# Patient Record
Sex: Male | Born: 1935 | Race: White | Hispanic: No | Marital: Married | Smoking: Former smoker
Health system: Southern US, Community
[De-identification: ages and names within clinical notes are randomized; demographics above are authoritative.]

## PROBLEM LIST (undated history)

## (undated) DIAGNOSIS — E119 Type 2 diabetes mellitus without complications: Secondary | ICD-10-CM

## (undated) DIAGNOSIS — J449 Chronic obstructive pulmonary disease, unspecified: Secondary | ICD-10-CM

## (undated) DIAGNOSIS — I2510 Atherosclerotic heart disease of native coronary artery without angina pectoris: Secondary | ICD-10-CM

## (undated) DIAGNOSIS — C61 Malignant neoplasm of prostate: Secondary | ICD-10-CM

## (undated) DIAGNOSIS — E785 Hyperlipidemia, unspecified: Secondary | ICD-10-CM

## (undated) DIAGNOSIS — I4892 Unspecified atrial flutter: Secondary | ICD-10-CM

## (undated) DIAGNOSIS — N182 Chronic kidney disease, stage 2 (mild): Secondary | ICD-10-CM

## (undated) DIAGNOSIS — I639 Cerebral infarction, unspecified: Secondary | ICD-10-CM

## (undated) DIAGNOSIS — I10 Essential (primary) hypertension: Secondary | ICD-10-CM

## (undated) HISTORY — DX: Malignant neoplasm of prostate: C61

## (undated) HISTORY — PX: CARDIAC SURGERY: SHX584

## (undated) HISTORY — DX: Type 2 diabetes mellitus without complications: E11.9

## (undated) HISTORY — DX: Chronic obstructive pulmonary disease, unspecified: J44.9

## (undated) HISTORY — DX: Unspecified atrial flutter: I48.92

## (undated) HISTORY — DX: Hyperlipidemia, unspecified: E78.5

## (undated) HISTORY — PX: CORONARY ARTERY BYPASS GRAFT: SHX141

## (undated) HISTORY — DX: Atherosclerotic heart disease of native coronary artery without angina pectoris: I25.10

## (undated) HISTORY — DX: Essential (primary) hypertension: I10

## (undated) HISTORY — PX: CARDIAC CATHETERIZATION: SHX172

## (undated) HISTORY — DX: Cerebral infarction, unspecified: I63.9

---

## 2004-12-05 ENCOUNTER — Ambulatory Visit: Payer: Self-pay | Admitting: Radiation Oncology

## 2004-12-19 ENCOUNTER — Ambulatory Visit: Payer: Self-pay | Admitting: Radiation Oncology

## 2005-12-04 ENCOUNTER — Ambulatory Visit: Payer: Self-pay | Admitting: Radiation Oncology

## 2005-12-19 ENCOUNTER — Ambulatory Visit: Payer: Self-pay | Admitting: Radiation Oncology

## 2006-12-04 ENCOUNTER — Ambulatory Visit: Payer: Self-pay | Admitting: Radiation Oncology

## 2006-12-19 ENCOUNTER — Ambulatory Visit: Payer: Self-pay | Admitting: Radiation Oncology

## 2007-12-03 ENCOUNTER — Ambulatory Visit: Payer: Self-pay | Admitting: Radiation Oncology

## 2007-12-08 ENCOUNTER — Ambulatory Visit: Payer: Self-pay | Admitting: Gastroenterology

## 2007-12-20 ENCOUNTER — Ambulatory Visit: Payer: Self-pay | Admitting: Radiation Oncology

## 2008-11-18 ENCOUNTER — Ambulatory Visit: Payer: Medicare Other | Admitting: Radiation Oncology

## 2008-12-01 ENCOUNTER — Ambulatory Visit: Payer: Medicare Other | Admitting: Radiation Oncology

## 2008-12-06 ENCOUNTER — Ambulatory Visit: Payer: Self-pay | Admitting: Cardiology

## 2008-12-12 ENCOUNTER — Ambulatory Visit: Payer: Self-pay

## 2008-12-19 ENCOUNTER — Ambulatory Visit: Payer: Medicare Other | Admitting: Radiation Oncology

## 2008-12-27 ENCOUNTER — Ambulatory Visit: Payer: Self-pay | Admitting: Cardiology

## 2009-09-14 DIAGNOSIS — I25810 Atherosclerosis of coronary artery bypass graft(s) without angina pectoris: Secondary | ICD-10-CM | POA: Insufficient documentation

## 2009-11-18 ENCOUNTER — Ambulatory Visit: Payer: Medicare Other | Admitting: Radiation Oncology

## 2009-12-04 ENCOUNTER — Ambulatory Visit: Payer: Medicare Other | Admitting: Radiation Oncology

## 2009-12-19 ENCOUNTER — Ambulatory Visit: Payer: Medicare Other | Admitting: Radiation Oncology

## 2010-02-20 ENCOUNTER — Encounter: Payer: Self-pay | Admitting: Cardiology

## 2010-02-20 ENCOUNTER — Ambulatory Visit: Payer: Self-pay | Admitting: Internal Medicine

## 2010-02-20 DIAGNOSIS — I252 Old myocardial infarction: Secondary | ICD-10-CM | POA: Insufficient documentation

## 2010-12-03 ENCOUNTER — Ambulatory Visit: Payer: Medicare Other | Admitting: Radiation Oncology

## 2010-12-04 LAB — PSA

## 2010-12-18 NOTE — Assessment & Plan Note (Signed)
Summary: f1y   Visit Type:  1 yr f/u  CC:  no cardiac complaints today.  Clinical Reports Reviewed:  Nuclear Study:  12/12/2008:  Exercise capacity - Adenosine study with no exercise  Blood Pressure - Normal BP response  Clinical Symptoms - no CP or dyspnea  ECG impression - No significant ST nsegment change suggestive of ischemia  Overall impression -  Scar in the inferior, inferolateral Zohan Shiflet with minimal perinfarct ischemia. Cannot rule out coexistent soft tissue attenuation. Again, no significant ischemia.   Current Medications (verified): 1)  Aspirin 81 Mg Tbec (Aspirin) .... Take One Tablet By Mouth Daily 2)  Centrum Silver  Tabs (Multiple Vitamins-Minerals) .Marland Kitchen.. 1 Tab Once Daily 3)  Meloxicam 15 Mg Tabs (Meloxicam) .Marland Kitchen.. 1 Tab Once Daily 4)  Lisinopril 5 Mg Tabs (Lisinopril) .Marland Kitchen.. 1 Tab Once Daily 5)  Coreg Cr 40 Mg Xr24h-Cap (Carvedilol Phosphate) .Marland Kitchen.. 1 Tab Once Daily 6)  Glyburide-Metformin 2.5-500 Mg Tabs (Glyburide-Metformin) .Marland Kitchen.. 1 Tab Two Times A Day 7)  Caduet 5-20 Mg Tabs (Amlodipine-Atorvastatin) .Marland Kitchen.. 1 Tab Once Daily 8)  Advair Diskus 250-50 Mcg/dose Aepb (Fluticasone-Salmeterol) .Marland Kitchen.. 1 Puff Once Daily 9)  Ventolin Hfa 108 (90 Base) Mcg/act Aers (Albuterol Sulfate) .Marland Kitchen.. 1 Puff Once Daily 10)  Vitamin D3 2000 Unit Caps (Cholecalciferol) .Marland Kitchen.. 1 Cap Once Daily  Allergies (verified): No Known Drug Allergies  Vital Signs:  Patient profile:   77 year old male Height:      70 inches Weight:      233 pounds BMI:     33.55 Pulse rate:   59 / minute Pulse rhythm:   irregular BP sitting:   104 / 60  (left arm) Cuff size:   large  Vitals Entered By: Julaine Hua, CMA (February 20, 2010 10:58 AM)   Impression & Recommendations:  Problem # 1:  CAD, ARTERY BYPASS GRAFT (ICD-414.04) Assessment Unchanged  He is stable from my standpoint. His Plavix has been discontinued which I agree with. He'll continue with aspirin. I'll see him back again in a year. His updated  medication list for this problem includes:    Aspirin 81 Mg Tbec (Aspirin) .Marland Kitchen... Take one tablet by mouth daily    Lisinopril 5 Mg Tabs (Lisinopril) .Marland Kitchen... 1 tab once daily    Coreg Cr 40 Mg Xr24h-cap (Carvedilol phosphate) .Marland Kitchen... 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 Take one tablet by mouth daily    Lisinopril 5 Mg Tabs (Lisinopril) .Marland Kitchen... 1 tab once daily    Coreg Cr 40 Mg Xr24h-cap (Carvedilol phosphate) .Marland Kitchen... 1 tab once daily  Patient Instructions: 1)  Your physician recommends that you schedule a follow-up appointment in: [place] 2)  Your physician recommends that you continue on your current medications as directed. Please refer to the Current Medication list given to you today.  Appended Document: [facility]     Primary Provider:  Dr. Bernita Buffy.Marland Kitchen   History of Present Illness: Mr Havranek returns for E and M of his CAD. He has no complaints of ischemic coronary disease, He denies DOE, palpitations, syncope, or peripheral edema.  He is now off Plavix.  Allergies: No Known Drug Allergies

## 2010-12-19 ENCOUNTER — Ambulatory Visit: Payer: Medicare Other | Admitting: Radiation Oncology

## 2011-04-02 NOTE — Assessment & Plan Note (Signed)
[facility] OFFICE NOTE   NAME:Orrico, MELVIN                       MRN:          [id number]  DATE:12/27/2008                            DOB:          07-15-1927    Mr. Zapanta comes in today for followup.   Please see my extensive note on December 06, 2008.   I received his operative report from [facility].  His surgical date  was May 05, 1990.  At that time, he had a vein graft placed to the left  PDA.  He had a vein graft placed to the ramus intermediate, and LIMA to  the proximal LAD.  He was part of the BARI study.   Stress Myoview showed scar of the inferior inferolateral wall with  minimal peri-infarct ischemia.  It may have been some soft tissue  attenuation.  There was felt to be no significant ischemia.  His EF  could not be gated.   His exam today, his blood pressure is 120/58, his pulse 68 and regular,  his weight is 216.  Rest of the exam is unchanged.   I have reviewed his meds once again.  I have gone over his operative  report from 1991 as well as a stress test and answered all questions  that he and his wife had.  I have talked about the importance of how to  handle an acute coronary syndrome including 911.  I will plan on seeing  him back in a year.     Thomas C. Verl Blalock, MD, [facility]  Electronically Signed    TCW/MedQ  DD: 12/27/2008  DT: 12/27/2008  Job #: [id number]   cc:   Lorelee Market

## 2011-04-02 NOTE — Assessment & Plan Note (Signed)
[facility] OFFICE NOTE   NAME:Devin Reese, Devin Reese                       MRN:          [id number]  DATE:12/06/2008                            DOB:          [birth year]    CHIEF COMPLAINT:  Get my heart checked out.   HISTORY OF PRESENT ILLNESS:  [name] is a delightful 77-year-  old married white male who comes today to establish with [place] as his  cardiologist.  He was a former patient of mine in 1991, when he had  coronary bypass grafting by Dr. Jari Pigg.  I do not have any [facility]  records.   He has done remarkably well.  He had a repeat stress test in 2002 by Dr.  Humphrey Rolls and then had cath at that time at [facility].  Apparently, he had a graft that was twisted.  Details otherwise are  unknown and we will need to obtain these records.  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   He is very active in his garden and in his yard.  He does have some  dyspnea on exertion, has had no true angina.  He denies any orthopnea,  PND, or peripheral edema.   He seems to be very compliant with his medications.  He quit smoking in  1985.   PAST MEDICAL HISTORY:   ALLERGIES:  He has no known drug allergies.   CURRENT MEDICATIONS:  1. Aspirin 81 mg per day.  2. Plavix 75 mg per day.  3. Lisinopril 5 mg per day.  4. Caduet 5/20 daily.  5. Coreg CR 40 mg per day.  6. Metformin 500 mg p.o. b.i.d.  7. Vitamin D3 1000 units t.i.d.  8. Multivitamin with zinc daily.   He currently does not smoke.  He does not drink.   SOCIAL HISTORY:  He is married, has 2 children.  He enjoys work in his  yard and in his garden.   His back up to his past medical history other than his bypass surgery  negative.   FAMILY HISTORY:  Negative for premature coronary artery disease.   REVIEW OF SYSTEMS:  Other  than the HPI is negative.  He does have a  history of prostate cancer, which is cured at this point in time.  He  did have extensive radiation at [facility].  Doctor involved at  that time was Dr. Donella Stade.   PHYSICAL EXAMINATION:  VITAL SIGNS:  His blood pressure today is 146/74,  his pulse is 70 and regular.  He is 5 feet, 10 inches, weighs 224.  GENERAL:  He is well-developed, overweight white male in no acute  distress.  He is extremely pleasant, alert, and oriented x3.  HEENT:  Essentially normal.  NECK:  Supple.  Carotid upstrokes were equal bilaterally with soft  systolic sounds bilaterally versus bruits, right greater than left.  Thyroid is not enlarged.  Trachea is midline.  No lymphadenopathy.  CHEST:  Lungs were clear to auscultation and percussion.  HEART:  Poorly appreciated PMI, soft S1 and S2; S2 splits  physiologically, a soft systolic murmur along left sternal border.  ABDOMINAL:  Obese, good bowel sounds.  No pulsatile mass.  No  tenderness.  No obvious organomegaly.  EXTREMITIES:  Pitting edema 1+ on the left, none on the right, pulses  were present, both dorsalis pedis and posterior tibial.  No sign of DVT.  SKIN:  Few ecchymoses, otherwise fairly benign.  MUSCULOSKELETAL:  Chronic arthritic changes.   Looking back through his chart, he has also had carotid Dopplers, July 18, 2008, which showed nonobstructive disease in the right and left  internal carotid arteries.  He has antegrade flow in both vertebrals.  He also had an echocardiogram on July 18, 2008, which showed EF of  51%, moderate left ventricular hypertrophy, mild diastolic dysfunction,  trivial mitral regurgitation, fibrocalcified aortic valve with no  stenosis or regurgitation.   His electrocardiogram today shows sinus rhythm with PVCs.  There is no  acute changes.   ASSESSMENT AND PLAN:  [name] has done remarkably well status post  coronary bypass grafting 19 years ago.  He has  preserved left  ventricular function and seems to be very compliant with his  medications.  Currently, his only symptom of any ischemia is dyspnea on  exertion.  He is long overdue any objective assessment of his  coronaries.  He has nonobstructive cerebrovascular disease on good  secondary preventative therapy.   PLAN:  1. Adenosine rest stress Myoview.  2. Obtain surgical records from [facility].  3. Obtain last cath report in 2001, from [facility].  4. Come back to see me to discuss the findings of the stress test      coupled with the information      from his past history.  He will have to have a fairly high risk      scan for me to recommend cardiac catheterization considering his      pericatheterization stroke.     Thomas C. Verl Blalock, MD, [facility]  Electronically Signed    TCW/MedQ  DD: 12/06/2008  DT: 12/06/2008  Job #: [id number]   cc:   Lorelee Market

## 2011-05-07 ENCOUNTER — Encounter: Payer: Self-pay | Admitting: Cardiovascular Disease

## 2011-12-04 ENCOUNTER — Ambulatory Visit: Payer: Self-pay | Admitting: Radiation Oncology

## 2011-12-05 LAB — PSA: PSA: 0.4 ng/mL (ref 0.0–4.0)

## 2011-12-20 ENCOUNTER — Ambulatory Visit: Payer: Self-pay | Admitting: Radiation Oncology

## 2013-02-23 ENCOUNTER — Encounter: Payer: Self-pay | Admitting: Cardiovascular Disease

## 2013-02-23 ENCOUNTER — Ambulatory Visit (INDEPENDENT_AMBULATORY_CARE_PROVIDER_SITE_OTHER): Payer: Medicare Other | Admitting: Cardiovascular Disease

## 2013-02-23 VITALS — BP 100/60 | HR 99 | Ht 69.0 in | Wt 200.8 lb

## 2013-02-23 DIAGNOSIS — I25810 Atherosclerosis of coronary artery bypass graft(s) without angina pectoris: Secondary | ICD-10-CM

## 2013-02-23 DIAGNOSIS — R0789 Other chest pain: Secondary | ICD-10-CM

## 2013-02-23 DIAGNOSIS — E0590 Thyrotoxicosis, unspecified without thyrotoxic crisis or storm: Secondary | ICD-10-CM

## 2013-02-23 DIAGNOSIS — I10 Essential (primary) hypertension: Secondary | ICD-10-CM

## 2013-02-23 DIAGNOSIS — R0602 Shortness of breath: Secondary | ICD-10-CM

## 2013-02-23 DIAGNOSIS — I2510 Atherosclerotic heart disease of native coronary artery without angina pectoris: Secondary | ICD-10-CM

## 2013-02-23 DIAGNOSIS — I4892 Unspecified atrial flutter: Secondary | ICD-10-CM

## 2013-02-23 NOTE — Patient Instructions (Addendum)
Stop Amlodipine.   Labs today.   Your physician has requested that you have an echocardiogram. Echocardiography is a painless test that uses sound waves to create images of your heart. It provides your doctor with information about the size and shape of your heart and how well your heart's chambers and valves are working. This procedure takes approximately one hour. There are no restrictions for this procedure.  Will call you with results and plan.

## 2013-02-25 ENCOUNTER — Encounter: Payer: Self-pay | Admitting: Cardiovascular Disease

## 2013-02-25 DIAGNOSIS — I4892 Unspecified atrial flutter: Secondary | ICD-10-CM | POA: Insufficient documentation

## 2013-02-25 DIAGNOSIS — E785 Hyperlipidemia, unspecified: Secondary | ICD-10-CM | POA: Insufficient documentation

## 2013-02-25 DIAGNOSIS — I2510 Atherosclerotic heart disease of native coronary artery without angina pectoris: Secondary | ICD-10-CM | POA: Insufficient documentation

## 2013-02-25 DIAGNOSIS — I10 Essential (primary) hypertension: Secondary | ICD-10-CM | POA: Insufficient documentation

## 2013-02-25 NOTE — Progress Notes (Signed)
Primary care physician, Dr. Brunetta Genera  HPI  This is a pleasant 79 year old male who is here today for evaluation of coronary artery disease and possible arrhythmia. The patient has known history of coronary artery disease status post CABG in 1991 at [facility]. He was part of the [place] trial. He reports most recent cardiac catheterization in 2004 by Dr. Humphrey Rolls which was complicated by a stroke according to the patient. He is not aware of any previous history of atrial fibrillation or flutter or any other kind of arrhythmia. He has been feeling poorly since Christmas with fatigue, dizziness and shortness of breath. He denies any chest pain. He is noted today to be in atrial flutter. He is not aware of this history.  No Known Allergies   Current Outpatient Prescriptions on File Prior to Visit  Medication Sig Dispense Refill  . albuterol (VENTOLIN HFA) 108 (90 BASE) MCG/ACT inhaler Inhale 2 puffs into the lungs daily.        Marland Kitchen aspirin 81 MG EC tablet Take 81 mg by mouth daily.        . carvedilol (COREG CR) 40 MG 24 hr capsule Take 40 mg by mouth daily.        . Cholecalciferol (VITAMIN D3) 2000 UNITS capsule Take 2,000 Units by mouth daily.        . Fluticasone-Salmeterol (ADVAIR DISKUS) 250-50 MCG/DOSE AEPB Inhale 1 puff into the lungs daily.        Marland Kitchen glyBURIDE-metformin (GLUCOVANCE) 2.5-500 MG per tablet Take 1 tablet by mouth 2 (two) times daily.        Marland Kitchen lisinopril (PRINIVIL,ZESTRIL) 5 MG tablet Take 5 mg by mouth daily.        . meloxicam (MOBIC) 15 MG tablet Take 15 mg by mouth daily.         No current facility-administered medications on file prior to visit.     Past Medical History  Diagnosis Date  . Diabetes mellitus without complication   . Stroke   . Coronary artery disease     CABG in 1991 at [facility]. Most recent cardiac catheterization in [id number] was complicated by stroke.  . Prostate cancer   . Hypertension   . Hyperlipidemia      Past Surgical History  Procedure Laterality Date    . Coronary artery bypass graft    . Cardiac catheterization       Family History  Problem Relation Age of Onset  . Cancer      family hx  . Coronary artery disease      family hx      History   Social History  . Marital Status: Married    Spouse Name: N/A    Number of Children: N/A  . Years of Education: N/A   Occupational History  . Not on file.   Social History Main Topics  . Smoking status: Former Smoker -- 1.00 packs/day for 25 years    Types: Cigarettes    Quit date: 04/29/1990  . Smokeless tobacco: Not on file  . Alcohol Use: 0.6 oz/week    1 Cans of beer per week     Comment: Rare  . Drug Use: No  . Sexually Active: Not on file   Other Topics Concern  . Not on file   Social History Narrative   Retired, married, does not get regular exercise.      ROS Constitutional: Negative for fever, chills, diaphoresis, activity change, appetite change. HENT: Negative for hearing loss, nosebleeds, congestion,  sore throat, facial swelling, drooling, trouble swallowing, neck pain, voice change, sinus pressure and tinnitus.  Eyes: Negative for photophobia, pain, discharge and visual disturbance.  Respiratory: Negative for apnea, cough, chest tightness and wheezing.  Cardiovascular: Negative for chest pain, palpitations and leg swelling.  Gastrointestinal: Negative for nausea, vomiting, abdominal pain, diarrhea, constipation, blood in stool and abdominal distention.  Genitourinary: Negative for dysuria, urgency, frequency, hematuria and decreased urine volume.  Musculoskeletal: Negative for myalgias, back pain, joint swelling, arthralgias and gait problem.  Skin: Negative for color change, pallor, rash and wound.  Neurological: Negative for dizziness, tremors, seizures, syncope, speech difficulty, weakness, light-headedness, numbness and headaches.  Psychiatric/Behavioral: Negative for suicidal ideas, hallucinations, behavioral problems and agitation. The patient is not  nervous/anxious.     PHYSICAL EXAM   BP 100/60  Pulse 99  Ht 5\' 9"  (1.753 m)  Wt 200 lb 12 oz (91.06 kg)  BMI 29.63 kg/m2 Constitutional: He is oriented to person, place, and time. He appears well-developed and well-nourished. No distress.  HENT: No nasal discharge.  Head: Normocephalic and atraumatic.  Eyes: Pupils are equal and round. Right eye exhibits no discharge. Left eye exhibits no discharge.  Neck: Normal range of motion. Neck supple. No JVD present. No thyromegaly present.  Cardiovascular: Normal rate, irregular rhythm, normal heart sounds and. Exam reveals no gallop and no friction rub. No murmur heard.  Pulmonary/Chest: Effort normal and breath sounds normal. No stridor. No respiratory distress. He has no wheezes. He has no rales. He exhibits no tenderness.  Abdominal: Soft. Bowel sounds are normal. He exhibits no distension. There is no tenderness. There is no rebound and no guarding.  Musculoskeletal: Normal range of motion. He exhibits no edema and no tenderness.  Neurological: He is alert and oriented to person, place, and time. Coordination normal.  Skin: Skin is warm and dry. No rash noted. He is not diaphoretic. No erythema. No pallor.  Psychiatric: He has a normal mood and affect. His behavior is normal. Judgment and thought content normal.       EKG:Typical atrial flutter with variable AV block ABNORMAL RHYTHM   ASSESSMENT AND PLAN

## 2013-02-25 NOTE — Assessment & Plan Note (Signed)
Due to his dizziness and relatively low blood pressure, I will stop amlodipine.

## 2013-02-25 NOTE — Assessment & Plan Note (Signed)
The patient is currently in typical atrial flutter with variable ventricular response. I suspect that this is likely the culprit for his symptoms of fatigue and dizziness. He is at high risk for thromboembolic complications related to this. Thus, anticoagulation is appropriate. However, he reports having dark stool recently with no bleeding. Thus, I will obtain routine labs today before making this decision. Will also check for occult blood in the stool. I will request an echocardiogram. The plan will be to anticoagulate him for at least 3 weeks before proceeding with cardioversion. The other option would be ablation but given his age, I would favor doing cardioversion and see if he maintained in sinus rhythm after that. Continue treatment with carvedilol.

## 2013-02-25 NOTE — Assessment & Plan Note (Signed)
Overall, he has no convincing symptoms of angina. I suspect that most of his current symptoms are related to atrial flutter. Continue medical therapy.

## 2013-03-01 ENCOUNTER — Telehealth: Payer: Self-pay

## 2013-03-01 NOTE — Telephone Encounter (Signed)
I spoke with [facility] who says they have lab results and are faxing these now

## 2013-03-02 ENCOUNTER — Other Ambulatory Visit: Payer: Self-pay

## 2013-03-02 MED ORDER — APIXABAN 2.5 MG PO TABS: 2.5000 mg | ORAL_TABLET | Freq: Two times a day (BID) | ORAL | Status: DC

## 2013-03-04 ENCOUNTER — Other Ambulatory Visit: Payer: Self-pay

## 2013-03-04 ENCOUNTER — Other Ambulatory Visit (INDEPENDENT_AMBULATORY_CARE_PROVIDER_SITE_OTHER): Payer: Medicare Other

## 2013-03-04 DIAGNOSIS — I4892 Unspecified atrial flutter: Secondary | ICD-10-CM

## 2013-03-18 ENCOUNTER — Ambulatory Visit: Payer: Self-pay | Admitting: Cardiovascular Disease

## 2013-03-18 ENCOUNTER — Encounter: Payer: Self-pay | Admitting: Cardiovascular Disease

## 2013-03-18 ENCOUNTER — Ambulatory Visit (INDEPENDENT_AMBULATORY_CARE_PROVIDER_SITE_OTHER): Payer: Medicare Other | Admitting: Cardiovascular Disease

## 2013-03-18 ENCOUNTER — Other Ambulatory Visit: Payer: Self-pay

## 2013-03-18 VITALS — BP 132/82 | HR 81 | Ht 69.0 in | Wt 213.8 lb

## 2013-03-18 DIAGNOSIS — I4892 Unspecified atrial flutter: Secondary | ICD-10-CM

## 2013-03-18 DIAGNOSIS — I2510 Atherosclerotic heart disease of native coronary artery without angina pectoris: Secondary | ICD-10-CM

## 2013-03-18 DIAGNOSIS — R0789 Other chest pain: Secondary | ICD-10-CM

## 2013-03-18 DIAGNOSIS — I10 Essential (primary) hypertension: Secondary | ICD-10-CM

## 2013-03-18 IMAGING — CR DG CHEST 2V
1 series · 2 of 2 positions shown · non-contrast
Comparison: none

REASON FOR EXAM: pre-procedure
COMMENTS:

PROCEDURE:     DXR - DXR CHEST PA (OR AP) AND LATERAL  - [DATE] [DATE]
RESULT:     Comparison: None.

[Series 1: w chest pa · 0.14mm/px · 2 of 2 slices shown]
[im 1/2]
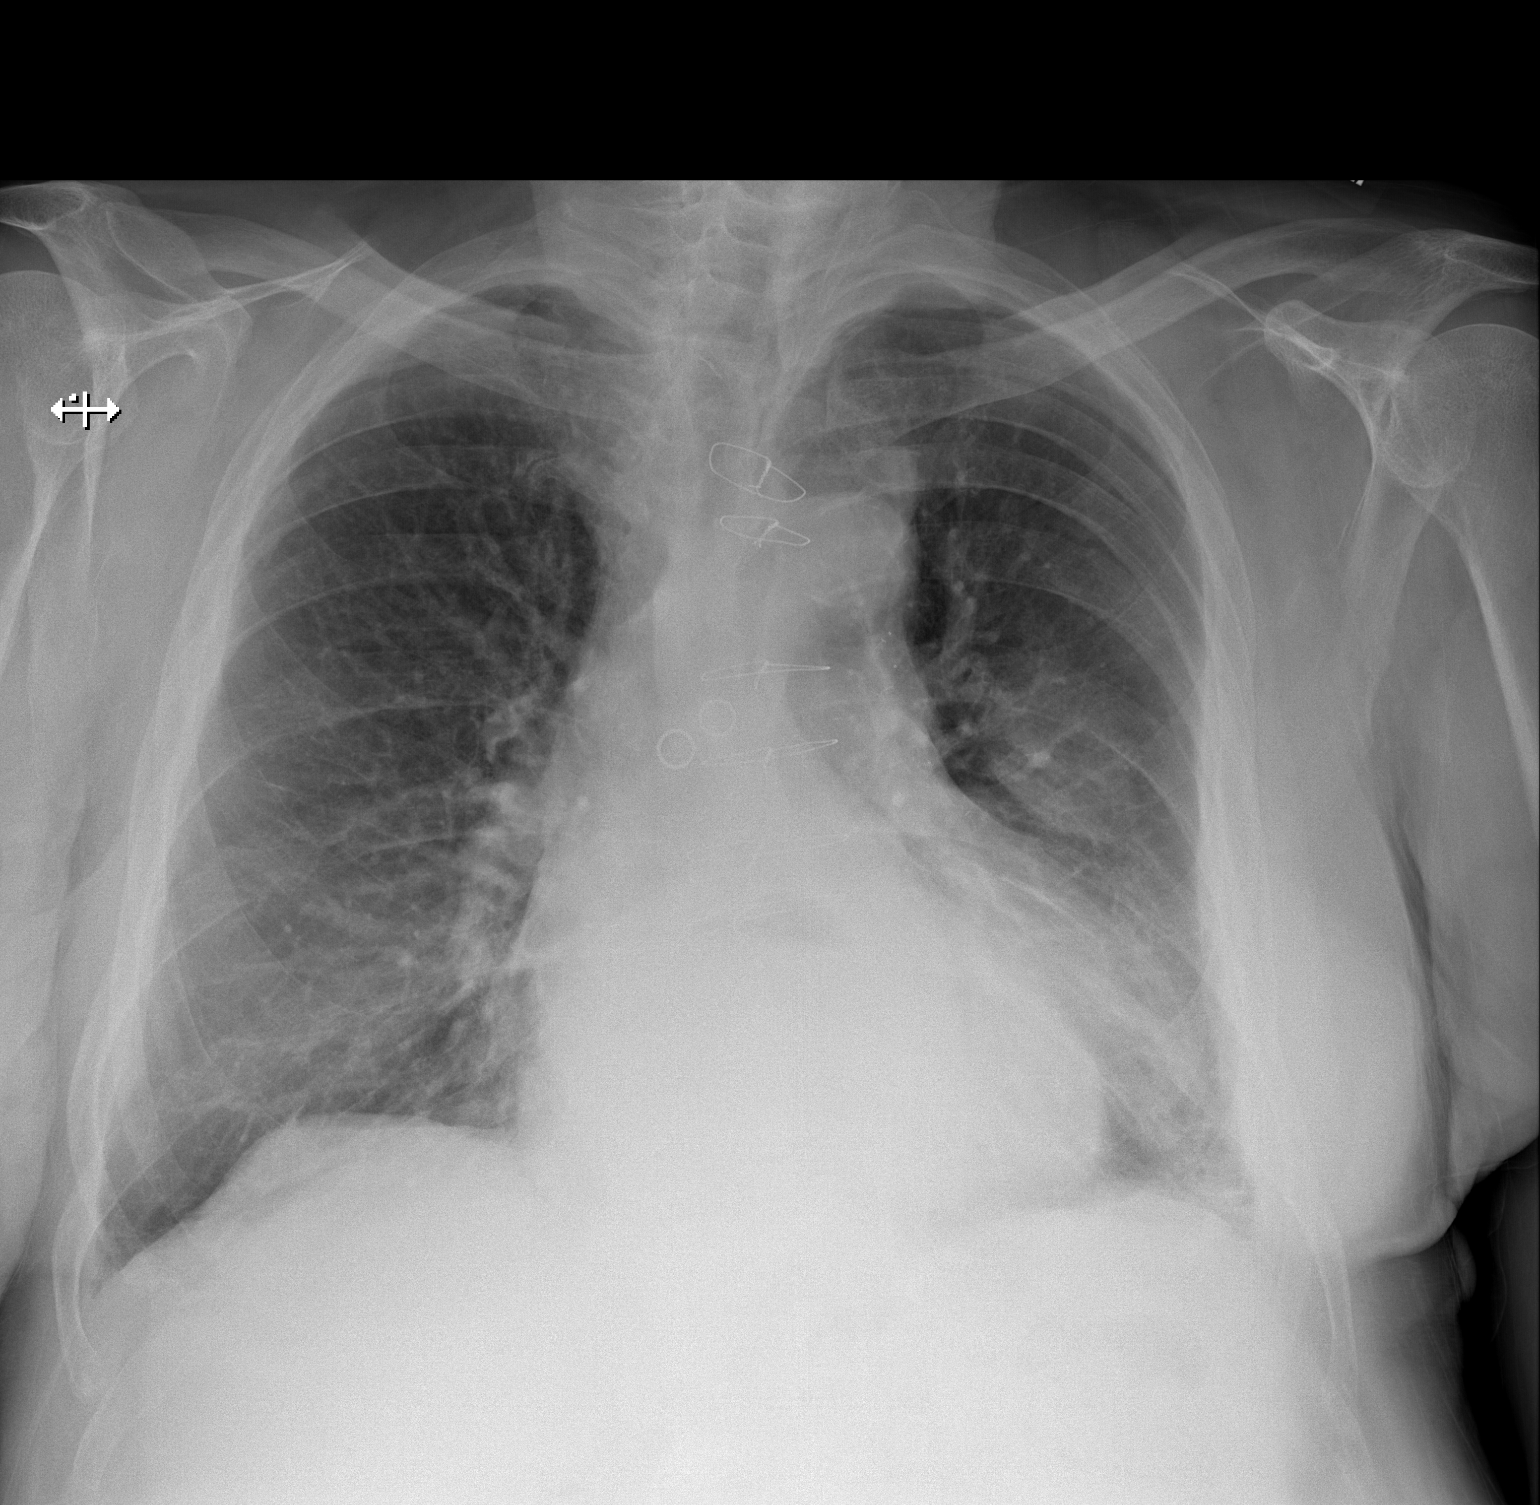
[im 2/2]
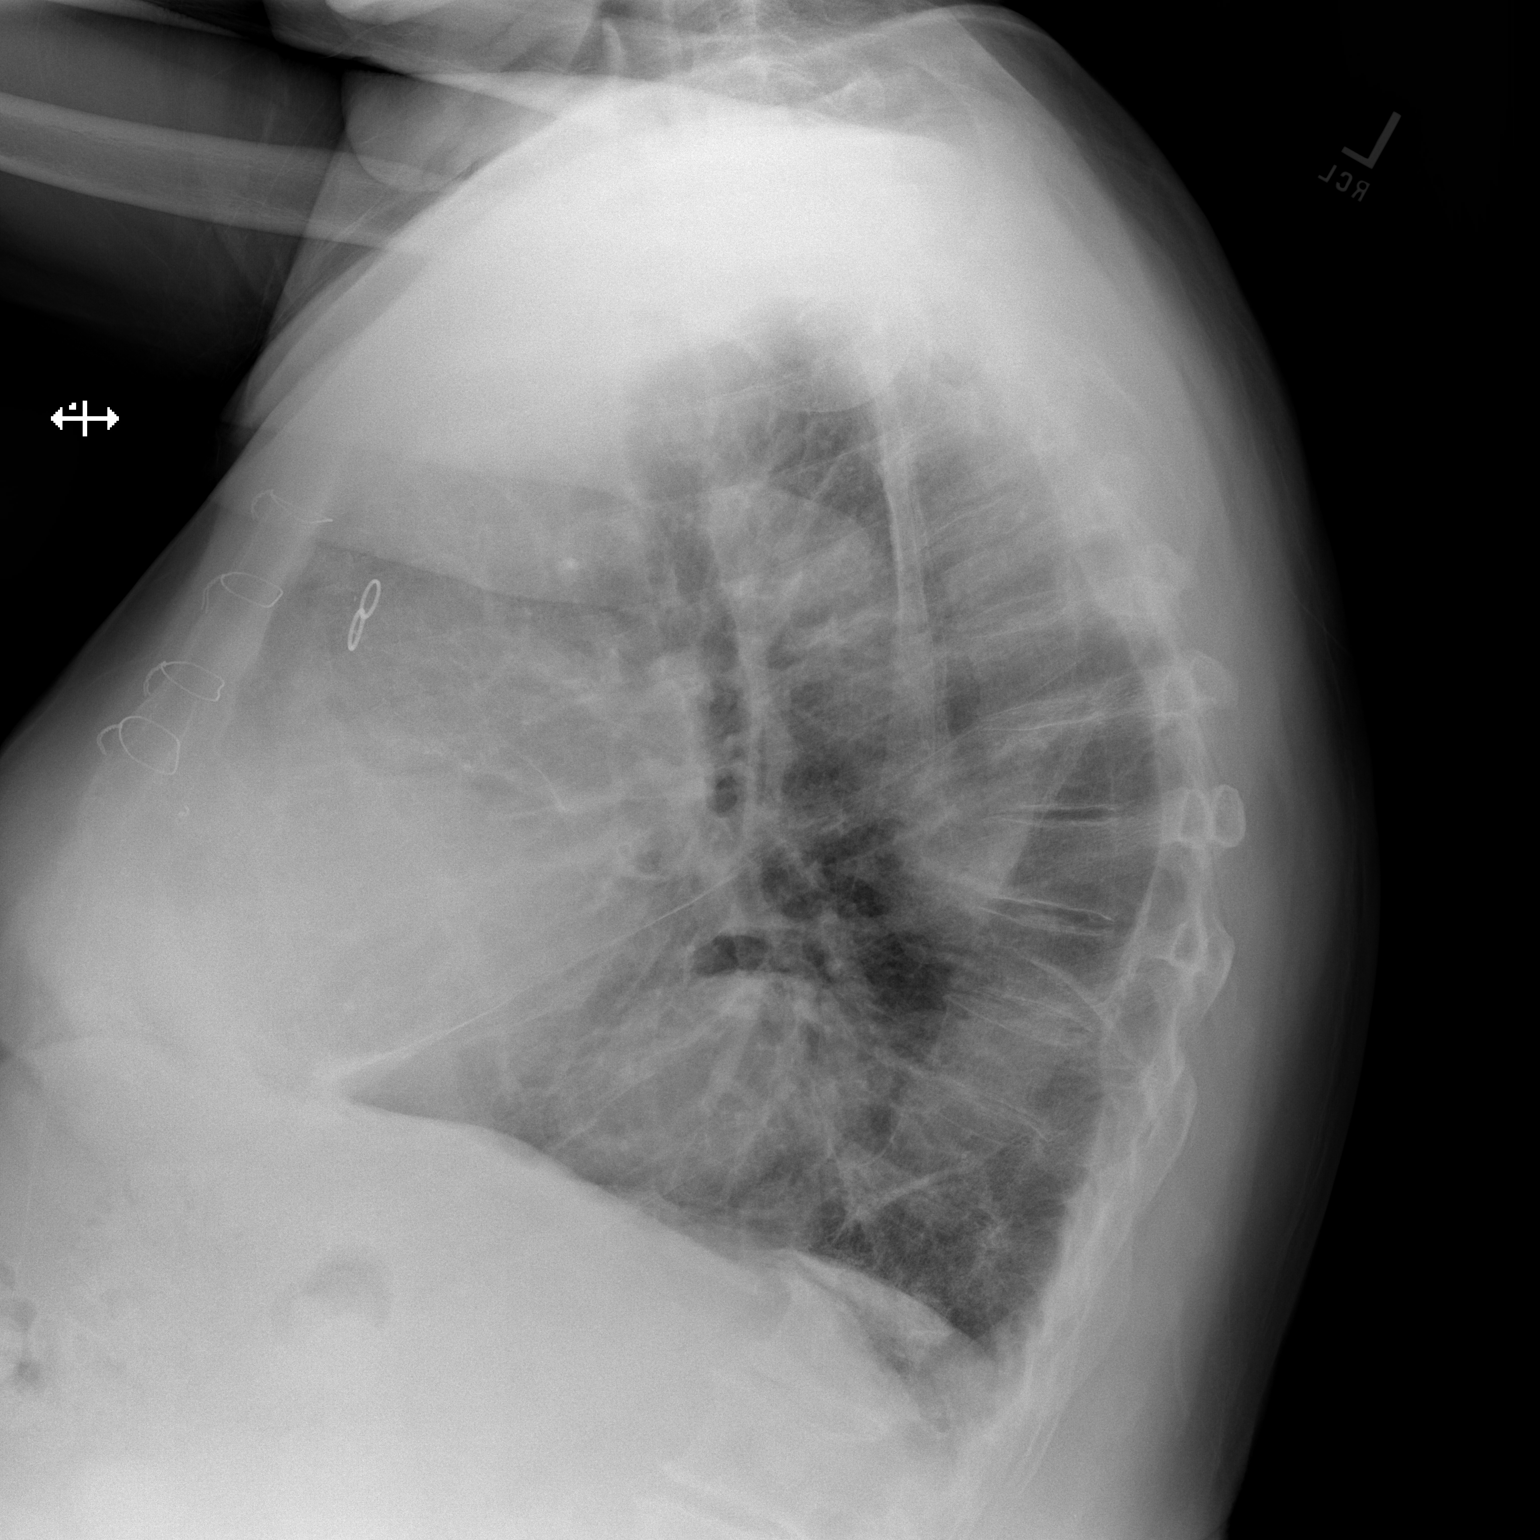

[2 of 2 positions shown; findings below may reference images not displayed]

FINDINGS: Prior median sternotomy and CABG. There is a double density along the left
side of the mediastinum which could be related to a large hiatal hernia and
cardiomegaly, but is of uncertain etiology. Additionally, there is a focal
round area of increased density overlying the left midlung.
IMPRESSION: 1. Double density along the left side of the mediastinum is of uncertain
etiology, but possibly related to cardiomegaly and a large hiatal hernia.
Other etiologies such as lymphadenopathy or a mediastinal mass cannot be
excluded. Further evaluation with chest CT is recommended.
2. There is a focal round area of increased density overlying the left
midlung. The possibility of a mass or pleural based density is raised.
Further evaluation with chest CT is again recommended.

[REDACTED]

## 2013-03-18 NOTE — Assessment & Plan Note (Signed)
His ventricular rate is controlled. However, he continues to be symptomatic. He is tolerating anticoagulation. Echocardiogram showed mildly reduced LV systolic function with mild mitral regurgitation. I recommend proceeding with cardioversion after at least 3 weeks of anticoagulation. This will be arranged. I will recheck his labs to see if the dose of Eliquis needs to be adjusted.

## 2013-03-18 NOTE — Progress Notes (Signed)
Primary care physician, Dr. Brunetta Genera  HPI  This is a pleasant 79 year old male who is here today for a followup visit regarding coronary artery disease and atrial flutter. He has known history of coronary artery disease status post CABG in 1991 at [facility]. He was part of the [place] trial. He was seen recently for fatigue, dizziness and shortness of breath . He was noted to be in atrial flutter with controlled ventricular rate. His blood pressure was borderline low. Thus, I stopped amlodipine. His labs came back overall unremarkable except for mild chronic kidney disease. I started him on anticoagulation with Eliquis 2.5 mg twice daily which is well tolerated so far. His dizziness has improved but he continues to have significant dyspnea.  No Known Allergies   Current Outpatient Prescriptions on File Prior to Visit  Medication Sig Dispense Refill  . albuterol (VENTOLIN HFA) 108 (90 BASE) MCG/ACT inhaler Inhale 2 puffs into the lungs daily.        Marland Kitchen apixaban (ELIQUIS) 2.5 MG TABS tablet Take 1 tablet (2.5 mg total) by mouth 2 (two) times daily.  60 tablet  3  . aspirin 81 MG EC tablet Take 81 mg by mouth daily.        . carvedilol (COREG CR) 40 MG 24 hr capsule Take 40 mg by mouth daily.        . Cholecalciferol (VITAMIN D3) 2000 UNITS capsule Take 2,000 Units by mouth daily.        . Fluticasone-Salmeterol (ADVAIR DISKUS) 250-50 MCG/DOSE AEPB Inhale 1 puff into the lungs daily.        Marland Kitchen glyBURIDE-metformin (GLUCOVANCE) 2.5-500 MG per tablet Take 1 tablet by mouth 2 (two) times daily.        Marland Kitchen lisinopril (PRINIVIL,ZESTRIL) 5 MG tablet Take 5 mg by mouth daily.        . pravastatin (PRAVACHOL) 40 MG tablet Take 40 mg by mouth daily.       . sertraline (ZOLOFT) 50 MG tablet Take 50 mg by mouth daily.        No current facility-administered medications on file prior to visit.     Past Medical History  Diagnosis Date  . Diabetes mellitus without complication   . Stroke   . Coronary artery  disease     CABG in 1991 at [facility]. Most recent cardiac catheterization in [id number] was complicated by stroke.  . Prostate cancer   . Hypertension   . Hyperlipidemia      Past Surgical History  Procedure Laterality Date  . Coronary artery bypass graft    . Cardiac catheterization       Family History  Problem Relation Age of Onset  . Cancer      family hx  . Coronary artery disease      family hx      History   Social History  . Marital Status: Married    Spouse Name: N/A    Number of Children: N/A  . Years of Education: N/A   Occupational History  . Not on file.   Social History Main Topics  . Smoking status: Former Smoker -- 1.00 packs/day for 25 years    Types: Cigarettes    Quit date: 04/29/1990  . Smokeless tobacco: Not on file  . Alcohol Use: 0.6 oz/week    1 Cans of beer per week     Comment: Rare  . Drug Use: No  . Sexually Active: Not on file   Other Topics Concern  .  Not on file   Social History Narrative   Retired, married, does not get regular exercise.        PHYSICAL EXAM   BP 132/82  Pulse 81  Ht 5\' 9"  (1.753 m)  Wt 213 lb 12 oz (96.956 kg)  BMI 31.55 kg/m2 Constitutional: He is oriented to person, place, and time. He appears well-developed and well-nourished. No distress.  HENT: No nasal discharge.  Head: Normocephalic and atraumatic.  Eyes: Pupils are equal and round. Right eye exhibits no discharge. Left eye exhibits no discharge.  Neck: Normal range of motion. Neck supple. No JVD present. No thyromegaly present.  Cardiovascular: Normal rate, irregular rhythm, normal heart sounds and. Exam reveals no gallop and no friction rub. No murmur heard.  Pulmonary/Chest: Effort normal and breath sounds normal. No stridor. No respiratory distress. He has no wheezes. He has no rales. He exhibits no tenderness.  Abdominal: Soft. Bowel sounds are normal. He exhibits no distension. There is no tenderness. There is no rebound and no guarding.    Musculoskeletal: Normal range of motion. He exhibits no edema and no tenderness.  Neurological: He is alert and oriented to person, place, and time. Coordination normal.  Skin: Skin is warm and dry. No rash noted. He is not diaphoretic. No erythema. No pallor.  Psychiatric: He has a normal mood and affect. His behavior is normal. Judgment and thought content normal.       [id number] flutter with variable AV block. Ventricular rate is 81 beats per minute ABNORMAL RHYTHM   ASSESSMENT AND PLAN

## 2013-03-18 NOTE — Patient Instructions (Addendum)
Stop Meloxicam (Mobic)  Continue other medications.  Schedule cardioversion in 10 day.

## 2013-03-18 NOTE — Assessment & Plan Note (Signed)
He will require ischemic cardiac evaluation once we restore sinus rhythm.

## 2013-03-18 NOTE — Assessment & Plan Note (Signed)
His blood pressure improved after stopping amlodipine.

## 2013-03-19 ENCOUNTER — Other Ambulatory Visit: Payer: Self-pay

## 2013-03-19 LAB — CBC WITH DIFFERENTIAL
Basophils Absolute: 0 10*3/uL (ref 0.0–0.2)
Basos: 0 % (ref 0–3)
Eos: 3 % (ref 0–5)
Eosinophils Absolute: 0.2 10*3/uL (ref 0.0–0.4)
HCT: 36.3 % — ABNORMAL LOW (ref 37.5–51.0)
Hemoglobin: 11.8 g/dL — ABNORMAL LOW (ref 12.6–17.7)
Immature Grans (Abs): 0 10*3/uL (ref 0.0–0.1)
Immature Granulocytes: 0 % (ref 0–2)
Lymphocytes Absolute: 1 10*3/uL (ref 0.7–3.1)
Lymphs: 21 % (ref 14–46)
MCH: 28.5 pg (ref 26.6–33.0)
MCHC: 32.5 g/dL (ref 31.5–35.7)
MCV: 88 fL (ref 79–97)
Monocytes Absolute: 0.6 10*3/uL (ref 0.1–0.9)
Monocytes: 12 % (ref 4–12)
Neutrophils Absolute: 3.2 10*3/uL (ref 1.4–7.0)
Neutrophils Relative %: 64 % (ref 40–74)
Platelets: 196 10*3/uL (ref 155–379)
RBC: 4.14 x10E6/uL (ref 4.14–5.80)
RDW: 15.3 % (ref 12.3–15.4)
WBC: 4.9 10*3/uL (ref 3.4–10.8)

## 2013-03-19 LAB — BASIC METABOLIC PANEL
BUN/Creatinine Ratio: 26 — ABNORMAL HIGH (ref 10–22)
BUN: 33 mg/dL — ABNORMAL HIGH (ref 8–27)
CO2: 22 mmol/L (ref 19–28)
Calcium: 9.1 mg/dL (ref 8.6–10.2)
Chloride: 107 mmol/L (ref 97–108)
Creatinine, Ser: 1.26 mg/dL (ref 0.76–1.27)
GFR calc Af Amer: 60 mL/min/{1.73_m2} (ref >59)
GFR calc non Af Amer: 52 mL/min/{1.73_m2} — ABNORMAL LOW (ref >59)
Glucose: 60 mg/dL — ABNORMAL LOW (ref 65–99)
Potassium: 4.8 mmol/L (ref 3.5–5.2)
Sodium: 141 mmol/L (ref 134–144)

## 2013-03-19 LAB — PROTIME-INR
INR: 1.2 (ref 0.8–1.2)
Prothrombin Time: 12.3 s — ABNORMAL HIGH (ref 9.1–12.0)

## 2013-03-19 MED ORDER — APIXABAN 5 MG PO TABS: 5.0000 mg | ORAL_TABLET | Freq: Two times a day (BID) | ORAL | Status: DC

## 2013-03-29 ENCOUNTER — Ambulatory Visit: Payer: Self-pay | Admitting: Cardiovascular Disease

## 2013-03-29 DIAGNOSIS — I4892 Unspecified atrial flutter: Secondary | ICD-10-CM

## 2013-03-29 HISTORY — PX: CARDIOVERSION: SHX1299

## 2013-04-06 ENCOUNTER — Encounter: Payer: Self-pay | Admitting: *Deleted

## 2013-04-13 ENCOUNTER — Ambulatory Visit: Payer: Medicare Other | Admitting: Cardiovascular Disease

## 2013-04-16 ENCOUNTER — Encounter: Payer: Self-pay | Admitting: Cardiovascular Disease

## 2013-04-16 ENCOUNTER — Ambulatory Visit (INDEPENDENT_AMBULATORY_CARE_PROVIDER_SITE_OTHER): Payer: Medicare Other | Admitting: Cardiovascular Disease

## 2013-04-16 VITALS — BP 112/54 | HR 61 | Ht 69.0 in | Wt 207.0 lb

## 2013-04-16 DIAGNOSIS — R9389 Abnormal findings on diagnostic imaging of other specified body structures: Secondary | ICD-10-CM | POA: Insufficient documentation

## 2013-04-16 DIAGNOSIS — R918 Other nonspecific abnormal finding of lung field: Secondary | ICD-10-CM

## 2013-04-16 DIAGNOSIS — I4891 Unspecified atrial fibrillation: Secondary | ICD-10-CM

## 2013-04-16 DIAGNOSIS — I25810 Atherosclerosis of coronary artery bypass graft(s) without angina pectoris: Secondary | ICD-10-CM

## 2013-04-16 DIAGNOSIS — R0600 Dyspnea, unspecified: Secondary | ICD-10-CM

## 2013-04-16 DIAGNOSIS — I10 Essential (primary) hypertension: Secondary | ICD-10-CM

## 2013-04-16 DIAGNOSIS — I4892 Unspecified atrial flutter: Secondary | ICD-10-CM

## 2013-04-16 DIAGNOSIS — R0989 Other specified symptoms and signs involving the circulatory and respiratory systems: Secondary | ICD-10-CM

## 2013-04-16 MED ORDER — CARVEDILOL PHOSPHATE ER 20 MG PO CP24: 20.0000 mg | ORAL_CAPSULE | Freq: Every day | ORAL | Status: DC

## 2013-04-16 NOTE — Assessment & Plan Note (Signed)
Chest x-ray showed double density onthe left side of the mediastinum. This is likely related to his known history of hiatal hernia. He has no symptoms. Continue observation. This was discussed with him today.

## 2013-04-16 NOTE — Progress Notes (Signed)
Primary care physician, Dr. Brunetta Genera  HPI  This is a pleasant 79 year old male who is here today for a followup visit regarding coronary artery disease and atrial flutter. He has known history of coronary artery disease status post CABG in 1991 at [facility]. He was part of the [place] trial. He was seen recently for fatigue, dizziness and shortness of breath . He was noted to be in atrial flutter with controlled ventricular rate. His blood pressure was borderline low. Thus, I stopped amlodipine and started him on anticoagulation with Eliquis. Echocardiogram showed mildly reduced LV systolic function with an ejection fraction of 40-45% with mild mitral regurgitation.  He underwent successful cardioversion to normal sinus rhythm. He felt better after that with less dyspnea. However, he is still not back to his baseline and continues to complain of fatigue and occasional dizziness.  No Known Allergies   Current Outpatient Prescriptions on File Prior to Visit  Medication Sig Dispense Refill  . albuterol (VENTOLIN HFA) 108 (90 BASE) MCG/ACT inhaler Inhale 2 puffs into the lungs daily.        Marland Kitchen apixaban (ELIQUIS) 5 MG TABS tablet Take 1 tablet (5 mg total) by mouth 2 (two) times daily.  60 tablet  3  . aspirin 81 MG EC tablet Take 81 mg by mouth daily.        . carvedilol (COREG CR) 40 MG 24 hr capsule Take 40 mg by mouth daily.        . Cholecalciferol (VITAMIN D3) 2000 UNITS capsule Take 2,000 Units by mouth daily.        . Fluticasone-Salmeterol (ADVAIR DISKUS) 250-50 MCG/DOSE AEPB Inhale 1 puff into the lungs daily.        Marland Kitchen glyBURIDE-metformin (GLUCOVANCE) 2.5-500 MG per tablet Take 1 tablet by mouth 2 (two) times daily.        Marland Kitchen lisinopril (PRINIVIL,ZESTRIL) 5 MG tablet Take 5 mg by mouth daily.        . pravastatin (PRAVACHOL) 40 MG tablet Take 40 mg by mouth daily.       . sertraline (ZOLOFT) 50 MG tablet Take 50 mg by mouth daily.        No current facility-administered medications on file prior  to visit.     Past Medical History  Diagnosis Date  . Diabetes mellitus without complication   . Stroke   . Coronary artery disease     CABG in 1991 at [facility]. Most recent cardiac catheterization in 123XX123 was complicated by stroke.  . Prostate cancer   . Hypertension   . Hyperlipidemia      Past Surgical History  Procedure Laterality Date  . Coronary artery bypass graft    . Cardiac catheterization    . Cardioversion  03/29/13     Family History  Problem Relation Age of Onset  . Cancer      family hx  . Coronary artery disease      family hx      History   Social History  . Marital Status: Married    Spouse Name: N/A    Number of Children: N/A  . Years of Education: N/A   Occupational History  . Not on file.   Social History Main Topics  . Smoking status: Former Smoker -- 1.00 packs/day for 25 years    Types: Cigarettes    Quit date: 04/29/1990  . Smokeless tobacco: Not on file  . Alcohol Use: 0.6 oz/week    1 Cans of beer per week  Comment: Rare  . Drug Use: No  . Sexually Active: Not on file   Other Topics Concern  . Not on file   Social History Narrative   Retired, married, does not get regular exercise.        PHYSICAL EXAM   BP 112/54  Ht 5\' 9"  (1.753 m)  Wt 207 lb (93.895 kg)  BMI 30.55 kg/m2 Constitutional: He is oriented to person, place, and time. He appears well-developed and well-nourished. No distress.  HENT: No nasal discharge.  Head: Normocephalic and atraumatic.  Eyes: Pupils are equal and round. Right eye exhibits no discharge. Left eye exhibits no discharge.  Neck: Normal range of motion. Neck supple. No JVD present. No thyromegaly present.  Cardiovascular: Normal rate, regular rhythm with premature beats, normal heart sounds and. Exam reveals no gallop and no friction rub. No murmur heard.  Pulmonary/Chest: Effort normal and breath sounds normal. No stridor. No respiratory distress. He has no wheezes. He has no rales. He  exhibits no tenderness.  Abdominal: Soft. Bowel sounds are normal. He exhibits no distension. There is no tenderness. There is no rebound and no guarding.  Musculoskeletal: Normal range of motion. He exhibits no edema and no tenderness.  Neurological: He is alert and oriented to person, place, and time. Coordination normal.  Skin: Skin is warm and dry. No rash noted. He is not diaphoretic. No erythema. No pallor.  Psychiatric: He has a normal mood and affect. His behavior is normal. Judgment and thought content normal.       EKG: Sinus bradycardia with frequent PACs.  ASSESSMENT AND PLAN

## 2013-04-16 NOTE — Assessment & Plan Note (Addendum)
He is currently in normal sinus rhythm after recent successful cardioversion. However, he is bradycardic with symptoms of fatigue. Thus, I will go ahead and increase the dose of Coreg CR 20 mg once daily. Continue anticoagulation with Eliquis. He will need followup labs in few months given that his renal function was mildly  abnormal.

## 2013-04-16 NOTE — Assessment & Plan Note (Signed)
Blood pressure is slightly low. Coreg was decreased in half as outlined above.

## 2013-04-16 NOTE — Patient Instructions (Addendum)
Decrease Coreg CR to 20 mg once daily.   Your physician has requested that you have a lexiscan myoview. For further information please visit [email or web address]. Please follow instruction sheet, as given.  Follow up in 3 months.

## 2013-04-16 NOTE — Assessment & Plan Note (Signed)
He continues to have symptoms of fatigue and exertional dyspnea even after recent cardioversion. Thus, I recommend evaluation with a pharmacologic nuclear stress test.

## 2013-04-26 ENCOUNTER — Other Ambulatory Visit: Payer: Self-pay

## 2013-04-26 ENCOUNTER — Ambulatory Visit: Payer: Self-pay | Admitting: Cardiovascular Disease

## 2013-04-26 DIAGNOSIS — R0600 Dyspnea, unspecified: Secondary | ICD-10-CM

## 2013-04-26 DIAGNOSIS — R0602 Shortness of breath: Secondary | ICD-10-CM

## 2013-07-14 ENCOUNTER — Other Ambulatory Visit: Payer: Self-pay | Admitting: *Deleted

## 2013-07-14 MED ORDER — APIXABAN 5 MG PO TABS: 5.0000 mg | ORAL_TABLET | Freq: Two times a day (BID) | ORAL | Status: DC

## 2013-07-14 NOTE — Telephone Encounter (Signed)
Refilled Eliquis sent to [facility].

## 2013-07-22 ENCOUNTER — Ambulatory Visit (INDEPENDENT_AMBULATORY_CARE_PROVIDER_SITE_OTHER): Payer: Medicare Other | Admitting: Cardiovascular Disease

## 2013-07-22 ENCOUNTER — Encounter: Payer: Self-pay | Admitting: Cardiovascular Disease

## 2013-07-22 VITALS — BP 128/60 | HR 52 | Ht 69.0 in | Wt 204.5 lb

## 2013-07-22 DIAGNOSIS — I10 Essential (primary) hypertension: Secondary | ICD-10-CM

## 2013-07-22 DIAGNOSIS — I4892 Unspecified atrial flutter: Secondary | ICD-10-CM

## 2013-07-22 DIAGNOSIS — I2510 Atherosclerotic heart disease of native coronary artery without angina pectoris: Secondary | ICD-10-CM

## 2013-07-22 NOTE — Progress Notes (Signed)
Primary care physician, Dr. Brunetta Genera  HPI  This is a pleasant 79 year old male who is here today for a followup visit regarding coronary artery disease and atrial flutter. He has known history of coronary artery disease status post CABG in 1991 at [facility]. He was part of the [place] trial. He was seen in April of this year for fatigue, dizziness and shortness of breath . He was noted to be in atrial flutter with controlled ventricular rate. His blood pressure was borderline low. Thus, I stopped amlodipine and started him on anticoagulation with Eliquis. Echocardiogram showed mildly reduced LV systolic function with an ejection fraction of 40-45% with mild mitral regurgitation.  He underwent successful cardioversion to normal sinus rhythm. He continued to complain of fatigue and dyspnea. Thus, I proceeded with a pharmacologic nuclear stress test which showed evidence of previous inferior scar with no significant ischemia. Ejection fraction was up to 60%. He has been doing well and denies any new symptoms.  No Known Allergies   Current Outpatient Prescriptions on File Prior to Visit  Medication Sig Dispense Refill  . albuterol (VENTOLIN HFA) 108 (90 BASE) MCG/ACT inhaler Inhale 2 puffs into the lungs daily.        Marland Kitchen apixaban (ELIQUIS) 5 MG TABS tablet Take 1 tablet (5 mg total) by mouth 2 (two) times daily.  60 tablet  3  . aspirin 81 MG EC tablet Take 81 mg by mouth daily.        . carvedilol (COREG CR) 20 MG 24 hr capsule Take 1 capsule (20 mg total) by mouth daily.  30 capsule  6  . Cholecalciferol (VITAMIN D3) 2000 UNITS capsule Take 2,000 Units by mouth daily.        Marland Kitchen glyBURIDE-metformin (GLUCOVANCE) 2.5-500 MG per tablet Take 1 tablet by mouth 2 (two) times daily.        Marland Kitchen lisinopril (PRINIVIL,ZESTRIL) 5 MG tablet Take 5 mg by mouth daily.        . pravastatin (PRAVACHOL) 40 MG tablet Take 40 mg by mouth daily.       . sertraline (ZOLOFT) 50 MG tablet Take 50 mg by mouth daily.        No  current facility-administered medications on file prior to visit.     Past Medical History  Diagnosis Date  . Diabetes mellitus without complication   . Stroke   . Coronary artery disease     CABG in 1991 at [facility]. Most recent cardiac catheterization in 123XX123 was complicated by stroke.  . Prostate cancer   . Hypertension   . Hyperlipidemia      Past Surgical History  Procedure Laterality Date  . Coronary artery bypass graft    . Cardiac catheterization    . Cardioversion  03/29/13     Family History  Problem Relation Age of Onset  . Cancer      family hx  . Coronary artery disease      family hx   . Heart disease Father   . Coronary artery disease Father      History   Social History  . Marital Status: Married    Spouse Name: N/A    Number of Children: N/A  . Years of Education: N/A   Occupational History  . Not on file.   Social History Main Topics  . Smoking status: Former Smoker -- 1.00 packs/day for 25 years    Types: Cigarettes    Quit date: 04/29/1990  . Smokeless tobacco: Not on  file  . Alcohol Use: 0.6 oz/week    1 Cans of beer per week     Comment: Rare  . Drug Use: No  . Sexual Activity: Not on file   Other Topics Concern  . Not on file   Social History Narrative   Retired, married, does not get regular exercise.        PHYSICAL EXAM   BP 128/60  Pulse 52  Ht 5\' 9"  (1.753 m)  Wt 204 lb 8 oz (92.761 kg)  BMI 30.19 kg/m2 Constitutional: He is oriented to person, place, and time. He appears well-developed and well-nourished. No distress.  HENT: No nasal discharge.  Head: Normocephalic and atraumatic.  Eyes: Pupils are equal and round. Right eye exhibits no discharge. Left eye exhibits no discharge.  Neck: Normal range of motion. Neck supple. No JVD present. No thyromegaly present.  Cardiovascular: Normal rate, regular rhythm with premature beats, normal heart sounds and. Exam reveals no gallop and no friction rub. No murmur heard.   Pulmonary/Chest: Effort normal and breath sounds normal. No stridor. No respiratory distress. He has no wheezes. He has no rales. He exhibits no tenderness.  Abdominal: Soft. Bowel sounds are normal. He exhibits no distension. There is no tenderness. There is no rebound and no guarding.  Musculoskeletal: Normal range of motion. He exhibits no edema and no tenderness.  Neurological: He is alert and oriented to person, place, and time. Coordination normal.  Skin: Skin is warm and dry. No rash noted. He is not diaphoretic. No erythema. No pallor.  Psychiatric: He has a normal mood and affect. His behavior is normal. Judgment and thought content normal.       EKG: Sinus bradycardia with frequent PACs.  ASSESSMENT AND PLAN

## 2013-07-22 NOTE — Assessment & Plan Note (Signed)
He is maintaining in sinus rhythm with PACs . He is mildly bradycardic but overall asymptomatic. Continue treatment with carvedilol and Eliquis.  I will check basic metabolic profile and CBC today given that he is on anticoagulation.

## 2013-07-22 NOTE — Assessment & Plan Note (Signed)
Recent stress test showed evidence of inferior scar with no significant ischemia. Ejection fraction was normal. Continue medical therapy.

## 2013-07-22 NOTE — Assessment & Plan Note (Signed)
Blood pressure is well controlled on current medications. 

## 2013-07-22 NOTE — Patient Instructions (Addendum)
Labs today.  Make sure to take Apixaban (Eliquis) twice daily and not once daily.   Follow up in 6 months.

## 2013-07-23 ENCOUNTER — Telehealth: Payer: Self-pay

## 2013-07-23 LAB — CBC WITH DIFFERENTIAL/PLATELET
Basophils Absolute: 0 10*3/uL (ref 0.0–0.2)
Eos: 4 % (ref 0–5)
HCT: 37.5 % (ref 37.5–51.0)
Hemoglobin: 12.4 g/dL — ABNORMAL LOW (ref 12.6–17.7)
Immature Granulocytes: 0 % (ref 0–2)
Lymphocytes Absolute: 1.1 10*3/uL (ref 0.7–3.1)
Lymphs: 24 % (ref 14–46)
MCHC: 33.1 g/dL (ref 31.5–35.7)
Monocytes: 10 % (ref 4–12)
RDW: 15.3 % (ref 12.3–15.4)
WBC: 4.3 10*3/uL (ref 3.4–10.8)

## 2013-07-23 LAB — BASIC METABOLIC PANEL
BUN/Creatinine Ratio: 24 — ABNORMAL HIGH (ref 10–22)
BUN: 31 mg/dL — ABNORMAL HIGH (ref 8–27)
CO2: 18 mmol/L (ref 18–29)
Calcium: 8.9 mg/dL (ref 8.6–10.2)
Chloride: 106 mmol/L (ref 97–108)
Creatinine, Ser: 1.3 mg/dL — ABNORMAL HIGH (ref 0.76–1.27)
Glucose: 96 mg/dL (ref 65–99)

## 2013-07-23 NOTE — Telephone Encounter (Signed)
lmom 

## 2013-07-23 NOTE — Telephone Encounter (Signed)
Message copied by Stana Bunting on Fri Jul 23, 2013 11:35 AM ------      Message from: Kathlyn [place] A      Created: Fri Jul 23, 2013  9:40 AM       stable kidney function. ------

## 2013-07-27 ENCOUNTER — Telehealth: Payer: Self-pay

## 2013-07-27 NOTE — Telephone Encounter (Signed)
Message copied by Stana Bunting on Tue Jul 27, 2013  8:39 AM ------      Message from: Kathlyn [place] A      Created: Fri Jul 23, 2013  9:40 AM       stable kidney function. ------

## 2013-08-03 ENCOUNTER — Telehealth: Payer: Self-pay

## 2013-08-03 NOTE — Telephone Encounter (Signed)
Message copied by Stana Bunting on Tue Aug 03, 2013  2:24 PM ------      Message from: [place], [place] A      Created: Fri Jul 23, 2013  9:40 AM       stable kidney function. ------

## 2013-08-03 NOTE — Telephone Encounter (Signed)
Spoke w/ pt's wife.  She will make him aware of results and have him call with any questions or concerns.

## 2014-01-31 ENCOUNTER — Encounter: Payer: Self-pay | Admitting: Cardiovascular Disease

## 2014-01-31 ENCOUNTER — Ambulatory Visit (INDEPENDENT_AMBULATORY_CARE_PROVIDER_SITE_OTHER): Payer: Medicare Other | Admitting: Cardiovascular Disease

## 2014-01-31 VITALS — BP 129/70 | HR 61 | Ht 64.0 in | Wt 201.8 lb

## 2014-01-31 DIAGNOSIS — I25810 Atherosclerosis of coronary artery bypass graft(s) without angina pectoris: Secondary | ICD-10-CM

## 2014-01-31 DIAGNOSIS — I10 Essential (primary) hypertension: Secondary | ICD-10-CM

## 2014-01-31 DIAGNOSIS — I4892 Unspecified atrial flutter: Secondary | ICD-10-CM

## 2014-01-31 DIAGNOSIS — E785 Hyperlipidemia, unspecified: Secondary | ICD-10-CM

## 2014-01-31 DIAGNOSIS — I2510 Atherosclerotic heart disease of native coronary artery without angina pectoris: Secondary | ICD-10-CM

## 2014-01-31 NOTE — Patient Instructions (Signed)
Stop taking Aspirin.  Continue other medications.   Your physician wants you to follow-up in: 6 months.  You will receive a reminder letter in the mail two months in advance. If you don't receive a letter, please call our office to schedule the follow-up appointment.

## 2014-01-31 NOTE — Assessment & Plan Note (Signed)
He continues to be in normal sinus rhythm with frequent PACs. Continue treatment with carvedilol and long-term anticoagulation with Eliquis. Labs 6 months ago were unremarkable except for mild chronic kidney disease.

## 2014-01-31 NOTE — Assessment & Plan Note (Signed)
Continue treatment with pravastatin with a target LDL of less than 70.

## 2014-01-31 NOTE — Assessment & Plan Note (Signed)
Blood pressure is well controlled on current medications. 

## 2014-01-31 NOTE — Progress Notes (Signed)
Primary care physician, Dr. Brunetta Genera  HPI  This is a pleasant 80 year old male who is here today for a followup visit regarding coronary artery disease and atrial flutter. He has known history of coronary artery disease status post CABG in 1991 at [facility]. He was part of the [place] trial. He was seen in April of 2014 for fatigue, dizziness and shortness of breath . He was noted to be in atrial flutter with controlled ventricular rate. Echocardiogram showed mildly reduced LV systolic function with an ejection fraction of 40-45% with mild mitral regurgitation.  He underwent successful cardioversion in 03/2013. A pharmacologic nuclear stress after cardioversion in May of 2014 showed evidence of previous inferior scar with no significant ischemia. Ejection fraction was up to 60%. He has been doing well and denies any chest pain or dyspnea. No palpitations. He does have bruising.  No Known Allergies   Current Outpatient Prescriptions on File Prior to Visit  Medication Sig Dispense Refill  . albuterol (VENTOLIN HFA) 108 (90 BASE) MCG/ACT inhaler Inhale 2 puffs into the lungs daily.        Marland Kitchen apixaban (ELIQUIS) 5 MG TABS tablet Take 1 tablet (5 mg total) by mouth 2 (two) times daily.  60 tablet  3  . aspirin 81 MG EC tablet Take 81 mg by mouth daily.        . carvedilol (COREG CR) 20 MG 24 hr capsule Take 1 capsule (20 mg total) by mouth daily.  30 capsule  6  . Cholecalciferol (VITAMIN D3) 2000 UNITS capsule Take 2,000 Units by mouth daily.        Marland Kitchen glyBURIDE-metformin (GLUCOVANCE) 2.5-500 MG per tablet Take 1 tablet by mouth 2 (two) times daily.        Marland Kitchen lisinopril (PRINIVIL,ZESTRIL) 5 MG tablet Take 5 mg by mouth daily.        . pravastatin (PRAVACHOL) 40 MG tablet Take 40 mg by mouth daily.       . sertraline (ZOLOFT) 50 MG tablet Take 50 mg by mouth daily.        No current facility-administered medications on file prior to visit.     Past Medical History  Diagnosis Date  . Diabetes  mellitus without complication   . Stroke   . Coronary artery disease     CABG in 1991 at [facility]. Most recent cardiac catheterization in 123XX123 was complicated by stroke.  . Prostate cancer   . Hypertension   . Hyperlipidemia      Past Surgical History  Procedure Laterality Date  . Coronary artery bypass graft    . Cardiac catheterization    . Cardioversion  03/29/13     Family History  Problem Relation Age of Onset  . Cancer      family hx  . Coronary artery disease      family hx   . Heart disease Father   . Coronary artery disease Father      History   Social History  . Marital Status: Married    Spouse Name: N/A    Number of Children: N/A  . Years of Education: N/A   Occupational History  . Not on file.   Social History Main Topics  . Smoking status: Former Smoker -- 1.00 packs/day for 25 years    Types: Cigarettes    Quit date: 04/29/1990  . Smokeless tobacco: Not on file  . Alcohol Use: 0.6 oz/week    1 Cans of beer per week  Comment: Rare  . Drug Use: No  . Sexual Activity: Not on file   Other Topics Concern  . Not on file   Social History Narrative   Retired, married, does not get regular exercise.        PHYSICAL EXAM   BP 129/70  Pulse 61  Ht 5\' 4"  (1.626 m)  Wt 201 lb 12 oz (91.513 kg)  BMI 34.61 kg/m2 Constitutional: He is oriented to person, place, and time. He appears well-developed and well-nourished. No distress.  HENT: No nasal discharge.  Head: Normocephalic and atraumatic.  Eyes: Pupils are equal and round. Right eye exhibits no discharge. Left eye exhibits no discharge.  Neck: Normal range of motion. Neck supple. No JVD present. No thyromegaly present.  Cardiovascular: Normal rate, regular rhythm with premature beats, normal heart sounds and. Exam reveals no gallop and no friction rub. No murmur heard.  Pulmonary/Chest: Effort normal and breath sounds normal. No stridor. No respiratory distress. He has no wheezes. He has no  rales. He exhibits no tenderness.  Abdominal: Soft. Bowel sounds are normal. He exhibits no distension. There is no tenderness. There is no rebound and no guarding.  Musculoskeletal: Normal range of motion. He exhibits no edema and no tenderness.  Neurological: He is alert and oriented to person, place, and time. Coordination normal.  Skin: Skin is warm and dry. No rash noted. He is not diaphoretic. No erythema. No pallor.  Psychiatric: He has a normal mood and affect. His behavior is normal. Judgment and thought content normal.       EKG: Sinus rhythm with frequent PACs  ASSESSMENT AND PLAN

## 2014-01-31 NOTE — Assessment & Plan Note (Signed)
Given no recent ischemic events and the fact that he is on anticoagulation, I stop aspirin today to minimize the risk of bleeding. Continue other medications.

## 2014-03-15 ENCOUNTER — Ambulatory Visit (INDEPENDENT_AMBULATORY_CARE_PROVIDER_SITE_OTHER): Payer: Medicare Other | Admitting: Cardiovascular Disease

## 2014-03-15 ENCOUNTER — Telehealth: Payer: Self-pay

## 2014-03-15 ENCOUNTER — Encounter: Payer: Self-pay | Admitting: Cardiovascular Disease

## 2014-03-15 VITALS — BP 124/65 | HR 69 | Ht 64.0 in | Wt 200.2 lb

## 2014-03-15 DIAGNOSIS — R0789 Other chest pain: Secondary | ICD-10-CM

## 2014-03-15 DIAGNOSIS — I25810 Atherosclerosis of coronary artery bypass graft(s) without angina pectoris: Secondary | ICD-10-CM

## 2014-03-15 DIAGNOSIS — I4892 Unspecified atrial flutter: Secondary | ICD-10-CM

## 2014-03-15 DIAGNOSIS — R0602 Shortness of breath: Secondary | ICD-10-CM

## 2014-03-15 DIAGNOSIS — I2510 Atherosclerotic heart disease of native coronary artery without angina pectoris: Secondary | ICD-10-CM

## 2014-03-15 MED ORDER — ALBUTEROL SULFATE HFA 108 (90 BASE) MCG/ACT IN AERS: 4.0000 | INHALATION_SPRAY | Freq: Four times a day (QID) | RESPIRATORY_TRACT | Status: DC | PRN

## 2014-03-15 MED ORDER — NITROGLYCERIN 0.4 MG SL SUBL: 0.4000 mg | SUBLINGUAL_TABLET | SUBLINGUAL | Status: DC | PRN

## 2014-03-15 NOTE — Assessment & Plan Note (Signed)
He is in NSR

## 2014-03-15 NOTE — Patient Instructions (Signed)
Your physician has recommended you make the following change in your medication:  Increase your Albuterol inhaler to 4 puffs daily as needed every 4-6 hrs  Start Nitroglycerin tablets 0.4 mg under the tongue. Take 1 tablet every 5 minutes (3 times) as needed for chest pain.  If pain does not resolve in 15 minutes notify EMS if cardiac pain continues.   Your physician recommends that you schedule a follow-up appointment in:  Keep follow up with your PCP

## 2014-03-15 NOTE — Telephone Encounter (Signed)
Pt wife called states pt is experiencing chest tightness, fatigue, feels like his heart is out of rhythm.

## 2014-03-15 NOTE — Telephone Encounter (Signed)
Spoke w/ pt.  He reports worsening chest tightness over the past several days, worse in the evening. Pt is very hard of hearing and difficult to review symptoms.  States that he feels give out, that his heart might be out of rhythm, but he "doesn't remember". States that he takes his meds as prescribed every day.  Feels that he sometimes works too hard and overexerts himself.  Reports sx are worse in the pm, not enough to bother him the am.  Reports mild chest tightness at the moment.  Initially reported that sx were present x a "couple of days", but on further questioning, reports that they have been going on for some time, he just hasn't sought attention.  Offered pt appt today to see Dr. Acie Fredrickson, but he states that he would prefer to remain w/ Dr. Fletcher Anon.  Advised him that Dr. Fletcher Anon has no openings and his sx need to be addressed.   On discussion, pt is agreeable to seeing Dr. Acie Fredrickson today at 11:00.

## 2014-03-15 NOTE — Assessment & Plan Note (Signed)
Devin Reese presents today after calling office with complaints of chest tightness. His symptoms do not sound anginal although he does have a history of coronary artery disease and coronary artery bypass grafting in 1991. His symptoms sound more pulmonary. He's having active wheezing. He complains of having some bronchitis earlier this year.  We will increase his albuterol to 2 puffs every 4-6 hours as needed. I've asked him to increase it for the next several days to see if this helps with his tightness.  We'll also call and nitroglycerin to see if that helps. He is scheduled to see his medical doctor in approximately 2 weeks. He'll return to see Dr. Fletcher Anon  as already scheduled in September.

## 2014-03-15 NOTE — Progress Notes (Signed)
Primary care physician, Dr. Brunetta Genera  HPI  This is a pleasant 80 year old male who is here today for a followup visit regarding coronary artery disease and atrial flutter. He has known history of coronary artery disease status post CABG in 1991 at [facility]. He was part of the [place] trial. He was seen in April of 2014 for fatigue, dizziness and shortness of breath . He was noted to be in atrial flutter with controlled ventricular rate. Echocardiogram showed mildly reduced LV systolic function with an ejection fraction of 40-45% with mild mitral regurgitation.  He underwent successful cardioversion in 03/2013. A pharmacologic nuclear stress after cardioversion in May of 2014 showed evidence of previous inferior scar with no significant ischemia. Ejection fraction was up to 60%. He has been doing well and denies any chest pain or dyspnea. No palpitations. He does have bruising.  March 07, 2014:  Devin Reese presents today with chest tightness.   He had treatment for bronchitis this past winter and thinks its due to his pulmonary . He coughs at night quite a bit.  Productive of yellow sputum.   The chest tightness is exacerbated by exertion ( according to his wife - he had lots of trouble describing the tightness).  He does not have any NTG.   His symptoms are NOT feel like his previous episode of CP prior to CABG.    He is a retired Clinical biochemist.  He still keeps busy doing chores around his house and yard.     No Known Allergies   Current Outpatient Prescriptions on File Prior to Visit  Medication Sig Dispense Refill  . albuterol (VENTOLIN HFA) 108 (90 BASE) MCG/ACT inhaler Inhale 2 puffs into the lungs daily.        Marland Kitchen apixaban (ELIQUIS) 5 MG TABS tablet Take 1 tablet (5 mg total) by mouth 2 (two) times daily.  60 tablet  3  . carvedilol (COREG CR) 20 MG 24 hr capsule Take 1 capsule (20 mg total) by mouth daily.  30 capsule  6  . Cholecalciferol (VITAMIN D3) 2000 UNITS capsule Take 2,000 Units by  mouth daily.        Marland Kitchen glyBURIDE-metformin (GLUCOVANCE) 2.5-500 MG per tablet Take 1 tablet by mouth 2 (two) times daily.        Marland Kitchen lisinopril (PRINIVIL,ZESTRIL) 5 MG tablet Take 5 mg by mouth daily.        . pravastatin (PRAVACHOL) 40 MG tablet Take 40 mg by mouth daily.       . sertraline (ZOLOFT) 50 MG tablet Take 50 mg by mouth daily.        No current facility-administered medications on file prior to visit.     Past Medical History  Diagnosis Date  . Diabetes mellitus without complication   . Stroke   . Coronary artery disease     CABG in 1991 at [facility]. Most recent cardiac catheterization in 123XX123 was complicated by stroke.  . Prostate cancer   . Hypertension   . Hyperlipidemia      Past Surgical History  Procedure Laterality Date  . Coronary artery bypass graft    . Cardiac catheterization    . Cardioversion  03/29/13     Family History  Problem Relation Age of Onset  . Cancer      family hx  . Coronary artery disease      family hx   . Heart disease Father   . Coronary artery disease Father  History   Social History  . Marital Status: Married    Spouse Name: N/A    Number of Children: N/A  . Years of Education: N/A   Occupational History  . Not on file.   Social History Main Topics  . Smoking status: Former Smoker -- 1.00 packs/day for 25 years    Types: Cigarettes    Quit date: 04/29/1990  . Smokeless tobacco: Not on file  . Alcohol Use: 0.6 oz/week    1 Cans of beer per week     Comment: Rare  . Drug Use: No  . Sexual Activity: Not on file   Other Topics Concern  . Not on file   Social History Narrative   Retired, married, does not get regular exercise.     PHYSICAL EXAM  BP 124/65  Pulse 69  Ht 5\' 4"  (1.626 m)  Wt 200 lb 4 oz (90.833 kg)  BMI 34.36 kg/m2 Constitutional: He is oriented to person, place, and time. He appears well-developed and well-nourished. No distress.  HENT: No nasal discharge.  Head: Normocephalic and  atraumatic.  Eyes: Pupils are equal and round. Right eye exhibits no discharge. Left eye exhibits no discharge.  Neck: Normal range of motion. Neck supple. No JVD present. No thyromegaly present.  Cardiovascular: Normal rate, regular rhythm with premature beats, normal heart sounds and. Exam reveals no gallop and no friction rub. No murmur heard.  Pulmonary/Chest: Effort normal and breath sounds normal. No stridor. No respiratory distress. He has no wheezes. He has no rales. He exhibits no tenderness.  Abdominal: Soft. Bowel sounds are normal. He exhibits no distension. There is no tenderness. There is no rebound and no guarding.  Musculoskeletal: Normal range of motion. He exhibits no edema and no tenderness.  Neurological: He is alert and oriented to person, place, and time. Coordination normal.  Skin: Skin is warm and dry. No rash noted. He is not diaphoretic. No erythema. No pallor.  Psychiatric: He has a normal mood and affect. His behavior is normal. Judgment and thought content normal.   EKG: 03/09/2014: Normal sinus rhythm at 69. He has sinus arrhythmia. There are no ST or T wave changes.  ASSESSMENT AND PLAN

## 2014-06-05 ENCOUNTER — Emergency Department: Payer: Self-pay | Admitting: Emergency Medicine

## 2014-06-05 LAB — TROPONIN I: Troponin-I: <0.02 ng/mL

## 2014-06-05 LAB — BASIC METABOLIC PANEL
ANION GAP: 7 (ref 7–16)
BUN: 26 mg/dL — AB (ref 7–18)
CALCIUM: 8.5 mg/dL (ref 8.5–10.1)
CO2: 24 mmol/L (ref 21–32)
CREATININE: 1.37 mg/dL — AB (ref 0.60–1.30)
Chloride: 109 mmol/L — ABNORMAL HIGH (ref 98–107)
EGFR (African American): 54 — ABNORMAL LOW
GFR CALC NON AF AMER: 47 — AB
Glucose: 171 mg/dL — ABNORMAL HIGH (ref 65–99)
Osmolality: 288 (ref 275–301)
Potassium: 4.3 mmol/L (ref 3.5–5.1)
SODIUM: 140 mmol/L (ref 136–145)

## 2014-06-05 LAB — CBC
HCT: 41.8 % (ref 40.0–52.0)
HGB: 13.5 g/dL (ref 13.0–18.0)
MCH: 30.2 pg (ref 26.0–34.0)
MCHC: 32.4 g/dL (ref 32.0–36.0)
MCV: 93 fL (ref 80–100)
Platelet: 161 10*3/uL (ref 150–440)
RBC: 4.47 10*6/uL (ref 4.40–5.90)
RDW: 14.7 % — ABNORMAL HIGH (ref 11.5–14.5)
WBC: 5.5 10*3/uL (ref 3.8–10.6)

## 2014-06-05 LAB — CK TOTAL AND CKMB (NOT AT ARMC)
CK, Total: 42 U/L
CK-MB: 1.2 ng/mL (ref 0.5–3.6)

## 2014-06-05 LAB — PRO B NATRIURETIC PEPTIDE: B-Type Natriuretic Peptide: 559 pg/mL — ABNORMAL HIGH (ref 0–450)

## 2014-06-05 IMAGING — CR DG CHEST 1V PORT
1 series · 2 of 2 positions shown · non-contrast
Comparison: Two-view chest x-ray [DATE].

CLINICAL DATA: Shortness of breath. Prior history of bronchitis.
Prior CABG.

EXAM:
PORTABLE CHEST - 1 VIEW

[Series 1: ap · 0.17mm/px · 2 of 2 slices shown]
[im 1/2]
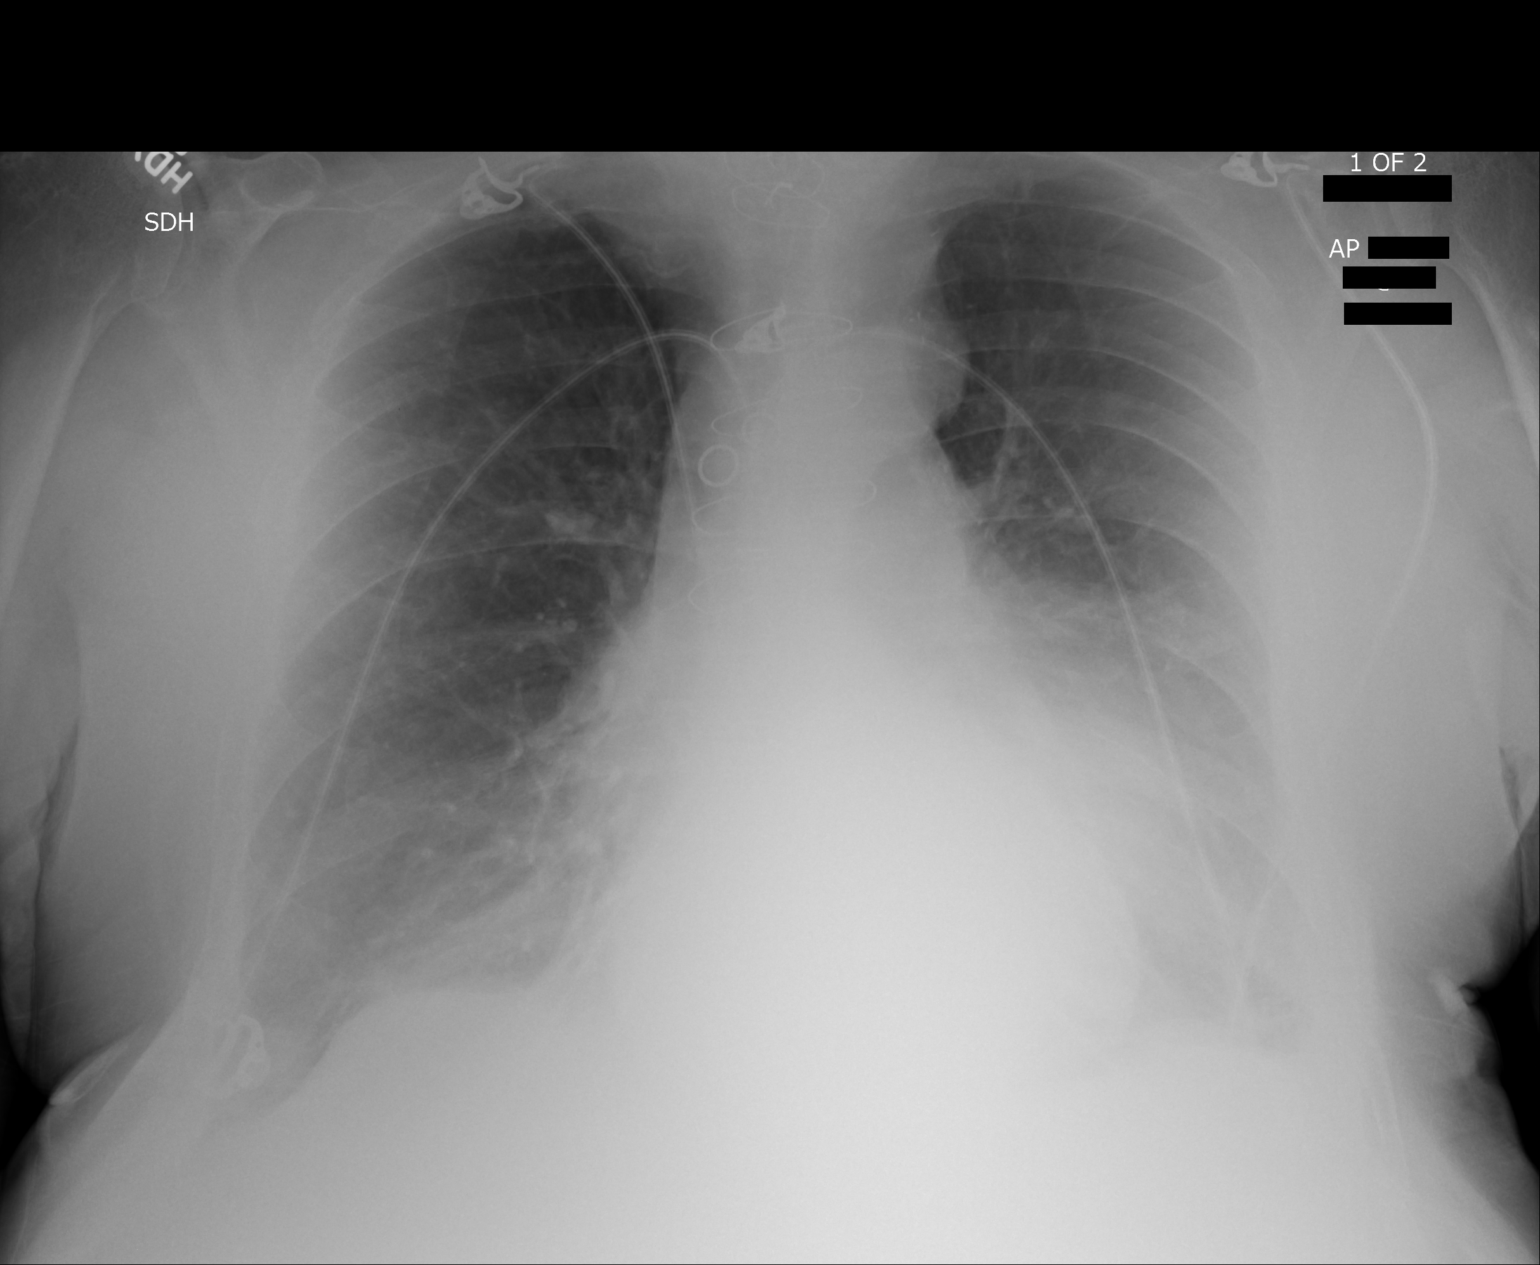
[im 2/2]
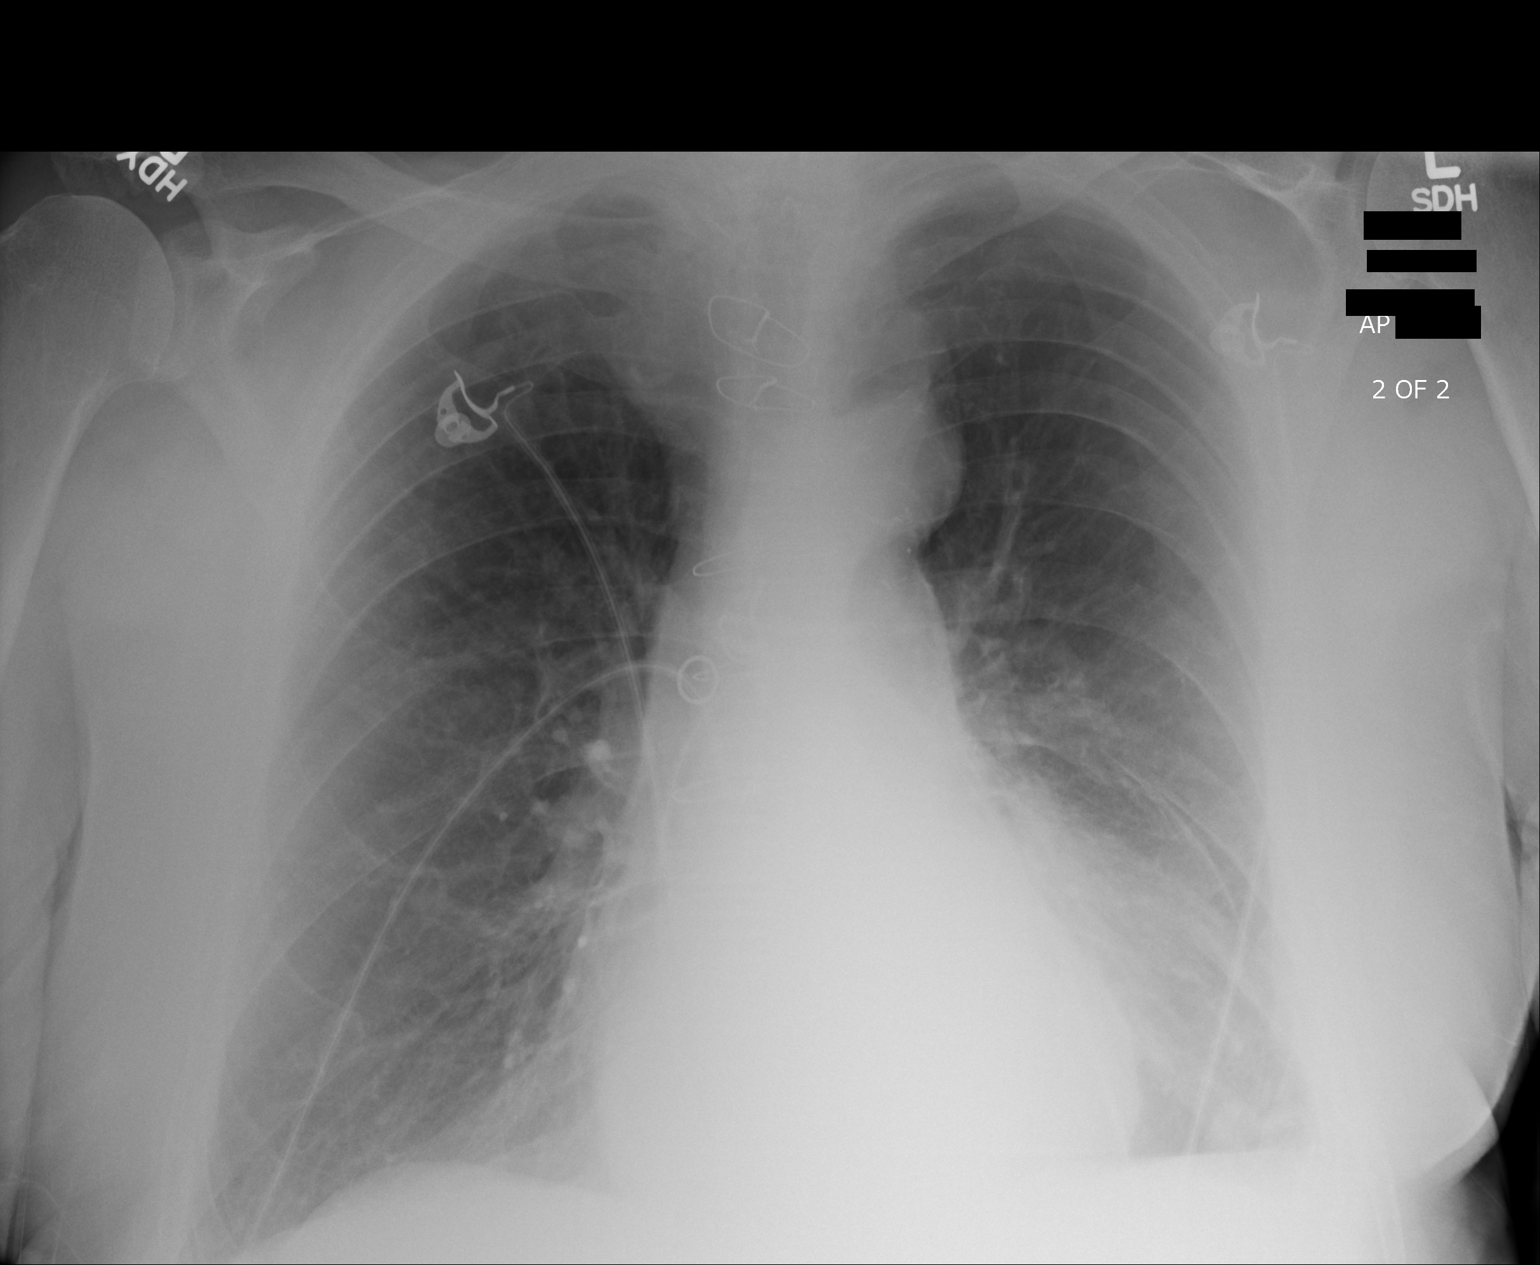

[2 of 2 positions shown; findings below may reference images not displayed]

FINDINGS: Prior sternotomy for CABG. Cardiac silhouette upper normal in size
for AP portable technique, unchanged. Thoracic aorta tortuous and
atherosclerotic, unchanged. Large hiatal hernia, unchanged. Stable
hyperinflation. Stable linear scarring in the left lung base. Lungs
otherwise clear. No localized airspace consolidation. No pleural
effusions. No pneumothorax. Normal pulmonary vascularity.
IMPRESSION: 1. COPD/emphysema. Linear scarring at the left lung base. No acute
cardiopulmonary disease.
2. Stable large hiatal hernia.

## 2014-06-06 ENCOUNTER — Telehealth: Payer: Self-pay | Admitting: Physician Assistant

## 2014-06-06 ENCOUNTER — Telehealth: Payer: Self-pay

## 2014-06-06 NOTE — Telephone Encounter (Signed)
BNP noted to be 559. If patient develops any worrisome symptoms have them go to the ER.

## 2014-06-06 NOTE — Telephone Encounter (Signed)
FYI

## 2014-06-06 NOTE — Telephone Encounter (Signed)
Pt wife called, states yesterday was SOB yesterday, went to ED, was told he had some CHF. Please call.

## 2014-06-06 NOTE — Telephone Encounter (Signed)
Patients wife stated that he is "feeling a little better today"  She stated that his "BNP was very elevated" and she would like him to see someone this week  Patient scheduled to see Christell Faith PA this Thursday

## 2014-06-09 ENCOUNTER — Ambulatory Visit (INDEPENDENT_AMBULATORY_CARE_PROVIDER_SITE_OTHER): Payer: Medicare Other | Admitting: Physician Assistant

## 2014-06-09 ENCOUNTER — Encounter: Payer: Self-pay | Admitting: Physician Assistant

## 2014-06-09 VITALS — BP 99/62 | HR 56 | Ht 69.0 in | Wt 198.5 lb

## 2014-06-09 DIAGNOSIS — R0789 Other chest pain: Secondary | ICD-10-CM

## 2014-06-09 DIAGNOSIS — J410 Simple chronic bronchitis: Secondary | ICD-10-CM

## 2014-06-09 DIAGNOSIS — I5022 Chronic systolic (congestive) heart failure: Secondary | ICD-10-CM

## 2014-06-09 DIAGNOSIS — R0602 Shortness of breath: Secondary | ICD-10-CM

## 2014-06-09 DIAGNOSIS — I25810 Atherosclerosis of coronary artery bypass graft(s) without angina pectoris: Secondary | ICD-10-CM

## 2014-06-09 DIAGNOSIS — I509 Heart failure, unspecified: Secondary | ICD-10-CM

## 2014-06-09 NOTE — Patient Instructions (Signed)
Your physician has requested that you have an echocardiogram. Echocardiography is a painless test that uses sound waves to create images of your heart. It provides your doctor with information about the size and shape of your heart and how well your heart's chambers and valves are working. This procedure takes approximately one hour. There are no restrictions for this procedure.  Your physician recommends that you schedule a follow-up appointment in:  Keep your follow up appt with Dr. Fletcher Anon

## 2014-06-09 NOTE — Progress Notes (Signed)
[place], [place] [address]  Date:  06/09/2014   Patient ID:  Armel, Sentman 1927-10-05, MRN [id number]   PCP:  Lorelee Market, MD  Cardiologist:  Dr. Fletcher Anon, MD        Patient Profile: 80 y.o. male with history of chronic systolic CHF and SOB who presents for follow up of recent ED visit on 06/05/2014 for SOB.    Problem List:  Past Medical History  Diagnosis Date  . Diabetes mellitus without complication   . Stroke   . Coronary artery disease     CABG in 1991 at [facility]. Most recent cardiac catheterization in 123XX123 was complicated by stroke.  . Prostate cancer   . Hypertension   . Hyperlipidemia   . Atrial flutter    Past Surgical History  Procedure Laterality Date  . Coronary artery bypass graft    . Cardiac catheterization    . Cardioversion  03/29/13     Allergies:  No Known Allergies   History of Present Illness: 80 y.o. male with the above problem list who presents for follow up after recent visit to the ED on 06/05/2014 for increased SOB and chest tightness.   He has known history of CAD s/p CABG in 1991 at [facility], part of the [place] trial. Evaluated in April 2014 for fatigue, dizziness, and SOB. Was noted to be in atrial flutter with controlled ventricular rate. Echo with EF 40-45%, mild mitral regurgitation. He underwent successful cardioversion in 03/2013.   He had a Lexiscan in 04/2013 that revealed evidence of prior inferior scar with no significant ischemia. EF 60%.   He was seen in 02/2014 with chest tightness and SOB. Had recent bought of bronchitis. Cough of yellow sputum. Was advised to use his albuterol inhaler. He was not.   His SOB, wheezing, and chest tightness continued to wax and wane. Finally his family got him to go to the ED on 06/05/2014. It was not that he was feeling that poorly, he just finally listened to them. While at that ED he was given albuterol nebs and IV solumedrol. BNP 559. EKG NSR, 64, pacs, no st/t  changes. CXR with COPD. He was diagnosed with an acute exacerbation of COPD and advised to use his albuterol inhaler.   Since his discharge he has actually used his albuterol inhaler q 4-6 hours prn and noticed a large improvement in his symptoms. His SOB and chest tightness are greatly improved. Cough is almost resolved. He has an appointment with pulmonology next Friday, 06/17/2014. He denies any chest pain, palpitations, edema, orthopnea, or early satiety. He continues to stay active without any issues at this time.     Home Medications:  Prior to Admission medications   Medication Sig Start Date End Date Taking? Authorizing Provider  albuterol (VENTOLIN HFA) 108 (90 BASE) MCG/ACT inhaler Inhale 4 puffs into the lungs every 6 (six) hours as needed for wheezing or shortness of breath. 03/15/14  Yes Thayer Headings, MD  apixaban (ELIQUIS) 5 MG TABS tablet Take 1 tablet (5 mg total) by mouth 2 (two) times daily. 07/14/13  Yes Wellington Hampshire, MD  carvedilol (COREG CR) 20 MG 24 hr capsule Take 1 capsule (20 mg total) by mouth daily. 04/16/13  Yes Wellington Hampshire, MD  Cholecalciferol (VITAMIN D3) 2000 UNITS capsule Take 2,000 Units by mouth daily.     Yes Historical Provider, MD  glyBURIDE-metformin (GLUCOVANCE) 2.5-500 MG per tablet Take 1 tablet by mouth 2 (  two) times daily.     Yes Historical Provider, MD  lisinopril (PRINIVIL,ZESTRIL) 5 MG tablet Take 5 mg by mouth daily.     Yes Historical Provider, MD  nitroGLYCERIN (NITROSTAT) 0.4 MG SL tablet Place 1 tablet (0.4 mg total) under the tongue every 5 (five) minutes as needed for chest pain. 03/15/14  Yes Thayer Headings, MD  pravastatin (PRAVACHOL) 40 MG tablet Take 40 mg by mouth daily.  02/12/13  Yes Historical Provider, MD  sertraline (ZOLOFT) 50 MG tablet Take 50 mg by mouth daily.  02/12/13  Yes Historical Provider, MD     Family History:  The patient's family history includes Cancer in an other family member; Coronary artery disease in his  father and another family member; Heart disease in his father.    Social History:  The patient  reports that he quit smoking about 24 years ago. His smoking use included Cigarettes. He has a 25 pack-year smoking history. He does not have any smokeless tobacco history on file. He reports that he does not drink alcohol or use illicit drugs.   Recent Labs: 07/22/2013: Creatinine 1.30*; Hemoglobin 12.4*; Potassium 5.1    Weights: Filed Weights   06/09/14 1422  Weight: 198 lb 8 oz (90.039 kg)       ROS:  Please see the history of present illness. All other systems reviewed and negative.    PHYSICAL EXAM:  VS:  BP 99/62  Pulse 56  Ht 5\' 9"  (1.753 m)  Wt 198 lb 8 oz (90.039 kg)  BMI 29.30 kg/m2  SpO2 96% Well nourished, well developed, in no acute distress HEENT: normal Neck: no JVD Cardiac:  normal S1, S2; RRR; no murmur Lungs:  clear to auscultation bilaterally, no wheezing, rhonchi or rales Abd: soft, nontender, no hepatomegaly Ext: trace bilateral pitting edema to the mid shin Skin: warm and dry Neuro:  moves all extremities spontaneously, no focal abnormalities noted  EKG:  Not done today. EKG in ED with NSR, 64, pacs, no st/t changes  ASSESSMENT AND PLAN:  1. Chronic systolic CHF: Will set him up for repeat echo next week. Last echo 03/04/13 with EF 40-45%, regional WMA cannot be excluded, and mild mitral regurg. Trace bilateral pitting edema to the mid shin. BNP in the ED last week was 559, no prior to compare.   2. SOB/chest tightness: Symptoms have significantly improved since going to the ED and being advised to use his albuterol inhaler. He has been advised to use his albuterol inhaler previously, however he was not. He is currently using this every 4-6 hours and notes tremendous improvement in his breathing. He has an appointment with the pulmonologist next Friday, 06/17/2014. BNP at the ED was 559, no prior on file to compare to, however last echo on 03/04/2013 with EF  40-45%, regional WMA cannot be excluded, and mild mitral regurg. He does have trace bilateral pitting edema to the mid shin. Will set patient up for echo next week. Further treatment pending echo results.   3. Dispo: Please perform EKG at his echo visit. Follow up with Dr. Fletcher Anon 08/08/2014.        Christell Faith, MHS, PA-C [place] [place] [place], [place] [address] [phone]

## 2014-06-14 ENCOUNTER — Other Ambulatory Visit (INDEPENDENT_AMBULATORY_CARE_PROVIDER_SITE_OTHER): Payer: Medicare Other

## 2014-06-14 ENCOUNTER — Other Ambulatory Visit: Payer: Self-pay

## 2014-06-14 DIAGNOSIS — R0602 Shortness of breath: Secondary | ICD-10-CM

## 2014-06-14 DIAGNOSIS — I5022 Chronic systolic (congestive) heart failure: Secondary | ICD-10-CM

## 2014-06-14 DIAGNOSIS — R079 Chest pain, unspecified: Secondary | ICD-10-CM

## 2014-06-14 DIAGNOSIS — I2510 Atherosclerotic heart disease of native coronary artery without angina pectoris: Secondary | ICD-10-CM

## 2014-06-29 NOTE — Telephone Encounter (Signed)
This encounter was created in error - please disregard.

## 2014-07-14 ENCOUNTER — Other Ambulatory Visit: Payer: Self-pay

## 2014-07-14 MED ORDER — APIXABAN 5 MG PO TABS: 5.0000 mg | ORAL_TABLET | Freq: Two times a day (BID) | ORAL | Status: DC

## 2014-07-14 NOTE — Telephone Encounter (Signed)
Refill sent for eliquis  

## 2014-07-14 NOTE — Telephone Encounter (Signed)
Refill sent for eliquis 5 mg.

## 2014-07-18 NOTE — Telephone Encounter (Signed)
This encounter was created in error - please disregard.

## 2014-08-08 ENCOUNTER — Ambulatory Visit (INDEPENDENT_AMBULATORY_CARE_PROVIDER_SITE_OTHER): Payer: Medicare Other | Admitting: Cardiovascular Disease

## 2014-08-08 ENCOUNTER — Encounter: Payer: Self-pay | Admitting: Cardiovascular Disease

## 2014-08-08 VITALS — BP 117/72 | HR 50 | Ht 66.0 in | Wt 200.8 lb

## 2014-08-08 DIAGNOSIS — E785 Hyperlipidemia, unspecified: Secondary | ICD-10-CM

## 2014-08-08 DIAGNOSIS — I2510 Atherosclerotic heart disease of native coronary artery without angina pectoris: Secondary | ICD-10-CM

## 2014-08-08 DIAGNOSIS — I25810 Atherosclerosis of coronary artery bypass graft(s) without angina pectoris: Secondary | ICD-10-CM

## 2014-08-08 DIAGNOSIS — I4892 Unspecified atrial flutter: Secondary | ICD-10-CM

## 2014-08-08 DIAGNOSIS — I10 Essential (primary) hypertension: Secondary | ICD-10-CM

## 2014-08-08 NOTE — Assessment & Plan Note (Signed)
Blood pressure is well controlled on current medications. 

## 2014-08-08 NOTE — Assessment & Plan Note (Signed)
He is maintaining in sinus rhythm with no recurrent arrhythmia. He is on long-term anticoagulation with Eliquis.

## 2014-08-08 NOTE — Progress Notes (Signed)
Primary care physician, Dr. Brunetta Genera  HPI  This is a pleasant 80 year old male who is here today for a followup visit regarding coronary artery disease and atrial flutter. He has known history of coronary artery disease status post CABG in 1991 at [facility]. He was part of the [place] trial. He was seen in April of 2014 for fatigue, dizziness and shortness of breath . He was noted to be in atrial flutter with controlled ventricular rate in 2014. Echocardiogram showed mildly reduced LV systolic function with an ejection fraction of 40-45% with mild mitral regurgitation.  He underwent successful cardioversion in 03/2013. A pharmacologic nuclear stress after cardioversion in May of 2014 showed evidence of previous inferior scar with no significant ischemia. Ejection fraction was up to 60%. He was seen recently for atypical chest pain and worsening dyspnea which was thought to be due to COPD. Symptoms improved with inhalers. Echocardiogram showed normal ejection fraction. He feels close to his baseline.  No Known Allergies   Current Outpatient Prescriptions on File Prior to Visit  Medication Sig Dispense Refill  . albuterol (VENTOLIN HFA) 108 (90 BASE) MCG/ACT inhaler Inhale 4 puffs into the lungs every 6 (six) hours as needed for wheezing or shortness of breath.      Marland Kitchen apixaban (ELIQUIS) 5 MG TABS tablet Take 1 tablet (5 mg total) by mouth 2 (two) times daily.  60 tablet  6  . carvedilol (COREG CR) 20 MG 24 hr capsule Take 1 capsule (20 mg total) by mouth daily.  30 capsule  6  . Cholecalciferol (VITAMIN D3) 2000 UNITS capsule Take 2,000 Units by mouth daily.        Marland Kitchen glyBURIDE-metformin (GLUCOVANCE) 2.5-500 MG per tablet Take 1 tablet by mouth 2 (two) times daily.        Marland Kitchen lisinopril (PRINIVIL,ZESTRIL) 5 MG tablet Take 5 mg by mouth daily.        . nitroGLYCERIN (NITROSTAT) 0.4 MG SL tablet Place 1 tablet (0.4 mg total) under the tongue every 5 (five) minutes as needed for chest pain.  25 tablet  3  .  pravastatin (PRAVACHOL) 40 MG tablet Take 40 mg by mouth daily.       . sertraline (ZOLOFT) 50 MG tablet Take 50 mg by mouth daily.        No current facility-administered medications on file prior to visit.     Past Medical History  Diagnosis Date  . Diabetes mellitus without complication   . Stroke   . Coronary artery disease     CABG in 1991 at [facility]. Most recent cardiac catheterization in 123XX123 was complicated by stroke.  . Prostate cancer   . Hypertension   . Hyperlipidemia   . Atrial flutter   . COPD (chronic obstructive pulmonary disease)      Past Surgical History  Procedure Laterality Date  . Coronary artery bypass graft    . Cardiac catheterization    . Cardioversion  03/29/13     Family History  Problem Relation Age of Onset  . Cancer      family hx  . Coronary artery disease      family hx   . Heart disease Father   . Coronary artery disease Father      History   Social History  . Marital Status: Married    Spouse Name: N/A    Number of Children: N/A  . Years of Education: N/A   Occupational History  . Not on file.  Social History Main Topics  . Smoking status: Former Smoker -- 1.00 packs/day for 25 years    Types: Cigarettes    Quit date: 04/29/1990  . Smokeless tobacco: Not on file  . Alcohol Use: No     Comment: Rare  . Drug Use: No  . Sexual Activity: Not on file   Other Topics Concern  . Not on file   Social History Narrative   Retired, married, does not get regular exercise.        PHYSICAL EXAM   BP 117/72  Pulse 50  Ht 5\' 6"  (1.676 m)  Wt 200 lb 12 oz (91.06 kg)  BMI 32.42 kg/m2 Constitutional: He is oriented to person, place, and time. He appears well-developed and well-nourished. No distress.  HENT: No nasal discharge.  Head: Normocephalic and atraumatic.  Eyes: Pupils are equal and round. Right eye exhibits no discharge. Left eye exhibits no discharge.  Neck: Normal range of motion. Neck supple. No JVD present. No  thyromegaly present.  Cardiovascular: Normal rate, regular rhythm with premature beats, normal heart sounds and. Exam reveals no gallop and no friction rub. No murmur heard.  Pulmonary/Chest: Effort normal and breath sounds normal. No stridor. No respiratory distress. He has no wheezes. He has no rales. He exhibits no tenderness.  Abdominal: Soft. Bowel sounds are normal. He exhibits no distension. There is no tenderness. There is no rebound and no guarding.  Musculoskeletal: Normal range of motion. He exhibits no edema and no tenderness.  Neurological: He is alert and oriented to person, place, and time. Coordination normal.  Skin: Skin is warm and dry. No rash noted. He is not diaphoretic. No erythema. No pallor.  Psychiatric: He has a normal mood and affect. His behavior is normal. Judgment and thought content normal.       ASSESSMENT AND PLAN

## 2014-08-08 NOTE — Assessment & Plan Note (Signed)
Continue treatment with pravastatin with a target LDL of less than 70.

## 2014-08-08 NOTE — Patient Instructions (Signed)
Continue same medications.   Your physician wants you to follow-up in: 6 months.  You will receive a reminder letter in the mail two months in advance. If you don't receive a letter, please call our office to schedule the follow-up appointment.  

## 2014-08-08 NOTE — Assessment & Plan Note (Signed)
He has no symptoms of angina. Continue medical therapy. 

## 2014-11-28 NOTE — Telephone Encounter (Signed)
This encounter was created in error - please disregard.

## 2015-02-07 ENCOUNTER — Ambulatory Visit (INDEPENDENT_AMBULATORY_CARE_PROVIDER_SITE_OTHER): Payer: Medicare Other | Admitting: Cardiovascular Disease

## 2015-02-07 ENCOUNTER — Encounter: Payer: Self-pay | Admitting: Cardiovascular Disease

## 2015-02-07 VITALS — BP 116/67 | HR 58 | Ht 68.0 in | Wt 200.5 lb

## 2015-02-07 DIAGNOSIS — I4892 Unspecified atrial flutter: Secondary | ICD-10-CM

## 2015-02-07 DIAGNOSIS — I10 Essential (primary) hypertension: Secondary | ICD-10-CM

## 2015-02-07 DIAGNOSIS — I2510 Atherosclerotic heart disease of native coronary artery without angina pectoris: Secondary | ICD-10-CM

## 2015-02-07 DIAGNOSIS — E785 Hyperlipidemia, unspecified: Secondary | ICD-10-CM

## 2015-02-07 NOTE — Patient Instructions (Signed)
Continue same medications.   Your physician wants you to follow-up in: 6 months.  You will receive a reminder letter in the mail two months in advance. If you don't receive a letter, please call our office to schedule the follow-up appointment.  

## 2015-02-07 NOTE — Assessment & Plan Note (Signed)
Blood pressure is well controlled on current medications. 

## 2015-02-07 NOTE — Assessment & Plan Note (Signed)
He continues to be in normal sinus rhythm. He is on anticoagulation with Eliquis. Most recent creatinine was 1.28. If creatinine was above 1.5, I recommend decreasing the dose to 2.5 mg twice daily. Continue to monitor heart rate and consider decreasing the dose of Coreg if heart rate goes below 50 bpm during daytime.

## 2015-02-07 NOTE — Assessment & Plan Note (Signed)
He is doing well and has no symptoms of angina. Continue medical therapy.

## 2015-02-07 NOTE — Assessment & Plan Note (Signed)
Continue treatment with pravastatin with a target LDL of less than 70.

## 2015-02-07 NOTE — Progress Notes (Signed)
Primary care physician, Dr. Brunetta Genera  HPI  This is a pleasant 81 year old male who is here today for a followup visit regarding coronary artery disease and atrial flutter. He has known history of coronary artery disease status post CABG in 1991 at [facility]. He was part of the [place] trial. He was seen in April of 2014 for fatigue, dizziness and shortness of breath . He was noted to be in atrial flutter with controlled ventricular rate in 2014. Echocardiogram showed mildly reduced LV systolic function with an ejection fraction of 40-45% with mild mitral regurgitation.  He underwent successful cardioversion in 03/2013. A pharmacologic nuclear stress after cardioversion in May of 2014 showed evidence of previous inferior scar with no significant ischemia. Ejection fraction was up to 60%. He has been doing well overall and denies any chest pain, shortness of breath or palpitations. He had a viral illness in December without complications.  No Known Allergies   Current Outpatient Prescriptions on File Prior to Visit  Medication Sig Dispense Refill  . albuterol (VENTOLIN HFA) 108 (90 BASE) MCG/ACT inhaler Inhale 4 puffs into the lungs every 6 (six) hours as needed for wheezing or shortness of breath.    Marland Kitchen apixaban (ELIQUIS) 5 MG TABS tablet Take 1 tablet (5 mg total) by mouth 2 (two) times daily. 60 tablet 6  . carvedilol (COREG CR) 20 MG 24 hr capsule Take 1 capsule (20 mg total) by mouth daily. 30 capsule 6  . Cholecalciferol (VITAMIN D3) 2000 UNITS capsule Take 2,000 Units by mouth daily.      Marland Kitchen glyBURIDE-metformin (GLUCOVANCE) 2.5-500 MG per tablet Take 1 tablet by mouth 2 (two) times daily.      Marland Kitchen lisinopril (PRINIVIL,ZESTRIL) 5 MG tablet Take 5 mg by mouth daily.      . nitroGLYCERIN (NITROSTAT) 0.4 MG SL tablet Place 1 tablet (0.4 mg total) under the tongue every 5 (five) minutes as needed for chest pain. 25 tablet 3  . pravastatin (PRAVACHOL) 40 MG tablet Take 40 mg by mouth daily.     .  sertraline (ZOLOFT) 50 MG tablet Take 50 mg by mouth daily.     Marland Kitchen Umeclidinium-Vilanterol 62.5-25 MCG/INH AEPB Inhale 1 puff into the lungs daily.     No current facility-administered medications on file prior to visit.     Past Medical History  Diagnosis Date  . Diabetes mellitus without complication   . Stroke   . Coronary artery disease     CABG in 1991 at [facility]. Most recent cardiac catheterization in 123XX123 was complicated by stroke.  . Prostate cancer   . Hypertension   . Hyperlipidemia   . Atrial flutter   . COPD (chronic obstructive pulmonary disease)      Past Surgical History  Procedure Laterality Date  . Coronary artery bypass graft    . Cardiac catheterization    . Cardioversion  03/29/13     Family History  Problem Relation Age of Onset  . Cancer      family hx  . Coronary artery disease      family hx   . Heart disease Father   . Coronary artery disease Father      History   Social History  . Marital Status: Married    Spouse Name: N/A  . Number of Children: N/A  . Years of Education: N/A   Occupational History  . Not on file.   Social History Main Topics  . Smoking status: Former Smoker -- 1.00 packs/day  for 25 years    Types: Cigarettes    Quit date: 04/29/1990  . Smokeless tobacco: Not on file  . Alcohol Use: No     Comment: Rare  . Drug Use: No  . Sexual Activity: Not on file   Other Topics Concern  . Not on file   Social History Narrative   Retired, married, does not get regular exercise.        PHYSICAL EXAM   BP 116/67 mmHg  Pulse 58  Ht 5\' 8"  (1.727 m)  Wt 200 lb 8 oz (90.946 kg)  BMI 30.49 kg/m2 Constitutional: He is oriented to person, place, and time. He appears well-developed and well-nourished. No distress.  HENT: No nasal discharge.  Head: Normocephalic and atraumatic.  Eyes: Pupils are equal and round. Right eye exhibits no discharge. Left eye exhibits no discharge.  Neck: Normal range of motion. Neck supple.  No JVD present. No thyromegaly present.  Cardiovascular: Normal rate, regular rhythm with premature beats, normal heart sounds and. Exam reveals no gallop and no friction rub. No murmur heard.  Pulmonary/Chest: Effort normal and breath sounds normal. No stridor. No respiratory distress. He has no wheezes. He has no rales. He exhibits no tenderness.  Abdominal: Soft. Bowel sounds are normal. He exhibits no distension. There is no tenderness. There is no rebound and no guarding.  Musculoskeletal: Normal range of motion. He exhibits no edema and no tenderness.  Neurological: He is alert and oriented to person, place, and time. Coordination normal.  Skin: Skin is warm and dry. No rash noted. He is not diaphoretic. No erythema. No pallor.  Psychiatric: He has a normal mood and affect. His behavior is normal. Judgment and thought content normal.     [id number]  Bradycardia  - frequent PAC s  # PACs = 3. -Nonspecific ST depression  -Nondiagnostic.   ABNORMAL     ASSESSMENT AND PLAN

## 2015-03-10 NOTE — Op Note (Signed)
PATIENT NAME:  Devin Reese, Devin Reese MR#:  [id number] DATE OF BIRTH:  11-10-1927  DATE OF PROCEDURE:  03/29/2013  PROCEDURE PERFORMED: Cardioversion.   INDICATION: Atrial flutter.   DESCRIPTION OF PROCEDURE: Standard informed consent was obtained. A timeout was performed. The pads were applied in the anterior and posterior fashion. The patient was given propofol by the anesthesia team. He was then shocked with one 50 joule synchronized shock. He converted to normal sinus rhythm with PACs. He tolerated the procedure well with no immediate complications.   CONCLUSION: Successful atrial flutter cardioversion to normal sinus rhythm.   RECOMMENDATIONS: Continue anticoagulation with Eliquis.  Continue the same medications and follow up in 2 weeks. This is a typical atrial flutter. If he develops recurrent episodes of this, then ablation is recommended.  ____________________________ Mertie Clause. Fletcher Anon, MD maa:sb D: 03/29/2013 08:05:59 ET T: 03/29/2013 13:30:01 ET JOB#: [id number]  cc: Monaca Wadas A. Fletcher Anon, MD, <Dictator> Wellington Hampshire MD ELECTRONICALLY SIGNED 04/02/2013 8:22

## 2015-08-11 ENCOUNTER — Encounter: Payer: Self-pay | Admitting: Cardiovascular Disease

## 2015-08-11 ENCOUNTER — Ambulatory Visit (INDEPENDENT_AMBULATORY_CARE_PROVIDER_SITE_OTHER): Payer: Medicare Other | Admitting: Cardiovascular Disease

## 2015-08-11 VITALS — BP 108/62 | HR 55 | Ht 69.0 in | Wt 204.5 lb

## 2015-08-11 DIAGNOSIS — I2510 Atherosclerotic heart disease of native coronary artery without angina pectoris: Secondary | ICD-10-CM

## 2015-08-11 DIAGNOSIS — I10 Essential (primary) hypertension: Secondary | ICD-10-CM

## 2015-08-11 DIAGNOSIS — R0602 Shortness of breath: Secondary | ICD-10-CM | POA: Diagnosis not present

## 2015-08-11 DIAGNOSIS — E785 Hyperlipidemia, unspecified: Secondary | ICD-10-CM

## 2015-08-11 DIAGNOSIS — I4892 Unspecified atrial flutter: Secondary | ICD-10-CM | POA: Diagnosis not present

## 2015-08-11 NOTE — Patient Instructions (Signed)
Medication Instructions: Continue same medications.   Labwork: None.   Procedures/Testing: None.   Follow-Up: 6 months with Dr. Arida.   Any Additional Special Instructions Will Be Listed Below (If Applicable).   

## 2015-08-11 NOTE — Progress Notes (Signed)
Primary care physician, Dr. Brunetta Genera  HPI  This is a pleasant 81 year old male who is here today for a followup visit regarding coronary artery disease and atrial flutter. He has known history of coronary artery disease status post CABG in 1991 at [facility]. He was part of the [place] trial. He was seen in April of 2014 for fatigue, dizziness and shortness of breath . He was noted to be in atrial flutter with controlled ventricular rate in 2014. Echocardiogram showed mildly reduced LV systolic function with an ejection fraction of 40-45% with mild mitral regurgitation.  He underwent successful cardioversion in 03/2013. A pharmacologic nuclear stress after cardioversion in May of 2014 showed evidence of previous inferior scar with no significant ischemia. Ejection fraction was up to 60%. He has been doing well overall and denies any chest pain or palpitations.  He continues to complain of exertional dyspnea and fatigue. No orthopnea or PND. No syncope or presyncope.  No Known Allergies   Current Outpatient Prescriptions on File Prior to Visit  Medication Sig Dispense Refill  . albuterol (VENTOLIN HFA) 108 (90 BASE) MCG/ACT inhaler Inhale 4 puffs into the lungs every 6 (six) hours as needed for wheezing or shortness of breath.    Marland Kitchen apixaban (ELIQUIS) 5 MG TABS tablet Take 1 tablet (5 mg total) by mouth 2 (two) times daily. 60 tablet 6  . carvedilol (COREG CR) 20 MG 24 hr capsule Take 1 capsule (20 mg total) by mouth daily. 30 capsule 6  . Cholecalciferol (VITAMIN D3) 2000 UNITS capsule Take 2,000 Units by mouth daily.      Marland Kitchen glyBURIDE-metformin (GLUCOVANCE) 2.5-500 MG per tablet Take 1 tablet by mouth 2 (two) times daily.      Marland Kitchen levothyroxine (SYNTHROID, LEVOTHROID) 25 MCG tablet Take 25 mcg by mouth daily before breakfast.    . lisinopril (PRINIVIL,ZESTRIL) 5 MG tablet Take 5 mg by mouth daily.      . nitroGLYCERIN (NITROSTAT) 0.4 MG SL tablet Place 1 tablet (0.4 mg total) under the tongue every 5  (five) minutes as needed for chest pain. 25 tablet 3  . pravastatin (PRAVACHOL) 40 MG tablet Take 40 mg by mouth daily.     . sertraline (ZOLOFT) 50 MG tablet Take 50 mg by mouth daily.     Marland Kitchen Umeclidinium-Vilanterol 62.5-25 MCG/INH AEPB Inhale 1 puff into the lungs daily.     No current facility-administered medications on file prior to visit.     Past Medical History  Diagnosis Date  . Diabetes mellitus without complication   . Stroke   . Coronary artery disease     CABG in 1991 at [facility]. Most recent cardiac catheterization in 123XX123 was complicated by stroke.  . Prostate cancer   . Hypertension   . Hyperlipidemia   . Atrial flutter   . COPD (chronic obstructive pulmonary disease)      Past Surgical History  Procedure Laterality Date  . Coronary artery bypass graft    . Cardiac catheterization    . Cardioversion  03/29/13     Family History  Problem Relation Age of Onset  . Cancer      family hx  . Coronary artery disease      family hx   . Heart disease Father   . Coronary artery disease Father      Social History   Social History  . Marital Status: Married    Spouse Name: N/A  . Number of Children: N/A  . Years of Education:  N/A   Occupational History  . Not on file.   Social History Main Topics  . Smoking status: Former Smoker -- 1.00 packs/day for 25 years    Types: Cigarettes    Quit date: 04/29/1990  . Smokeless tobacco: Not on file  . Alcohol Use: No     Comment: Rare  . Drug Use: No  . Sexual Activity: Not on file   Other Topics Concern  . Not on file   Social History Narrative   Retired, married, does not get regular exercise.        PHYSICAL EXAM   BP 108/62 mmHg  Pulse 55  Ht 5\' 9"  (1.753 m)  Wt 204 lb 8 oz (92.761 kg)  BMI 30.19 kg/m2 Constitutional: He is oriented to person, place, and time. He appears well-developed and well-nourished. No distress.  HENT: No nasal discharge.  Head: Normocephalic and atraumatic.  Eyes:  Pupils are equal and round. Right eye exhibits no discharge. Left eye exhibits no discharge.  Neck: Normal range of motion. Neck supple. No JVD present. No thyromegaly present.  Cardiovascular: Normal rate, regular rhythm with premature beats, normal heart sounds and. Exam reveals no gallop and no friction rub. No murmur heard.  Pulmonary/Chest: Effort normal and breath sounds normal. No stridor. No respiratory distress. He has no wheezes. He has no rales. He exhibits no tenderness.  Abdominal: Soft. Bowel sounds are normal. He exhibits no distension. There is no tenderness. There is no rebound and no guarding.  Musculoskeletal: Normal range of motion. He exhibits no edema and no tenderness.  Neurological: He is alert and oriented to person, place, and time. Coordination normal.  Skin: Skin is warm and dry. No rash noted. He is not diaphoretic. No erythema. No pallor.  Psychiatric: He has a normal mood and affect. His behavior is normal. Judgment and thought content normal.     [id number]  Bradycardia  - frequent PAC s  # PACs = 2. -RSR(V1) -nondiagnostic.   PROBABLY NORMAL   ASSESSMENT AND PLAN

## 2015-08-13 NOTE — Assessment & Plan Note (Signed)
He had no recurrent arrhythmia since last visit. Heart rate continues to be borderline slow but given the presence of PACs, I elected not to decrease the dose of carvedilol.

## 2015-08-13 NOTE — Assessment & Plan Note (Signed)
Followed by primary care physician. Recommend a target LDL of less than 70.

## 2015-08-13 NOTE — Assessment & Plan Note (Signed)
Blood pressure is borderline low. He is on lisinopril given that he is diabetic. Continue to monitor.

## 2015-08-13 NOTE — Assessment & Plan Note (Signed)
He has no symptoms suggestive of angina. Continue medical therapy. 

## 2016-02-08 ENCOUNTER — Encounter: Payer: Self-pay | Admitting: Cardiovascular Disease

## 2016-02-08 ENCOUNTER — Ambulatory Visit (INDEPENDENT_AMBULATORY_CARE_PROVIDER_SITE_OTHER): Payer: Medicare Other | Admitting: Cardiovascular Disease

## 2016-02-08 VITALS — BP 102/52 | HR 54 | Ht 68.0 in | Wt 197.5 lb

## 2016-02-08 DIAGNOSIS — I4892 Unspecified atrial flutter: Secondary | ICD-10-CM | POA: Diagnosis not present

## 2016-02-08 MED ORDER — CARVEDILOL PHOSPHATE ER 10 MG PO CP24: 10.0000 mg | ORAL_CAPSULE | Freq: Every day | ORAL | Status: DC

## 2016-02-08 NOTE — Patient Instructions (Signed)
Medication Instructions:  Your physician has recommended you make the following change in your medication:  DECREASE coreg to 10mg  daily   Labwork: none  Testing/Procedures: none  Follow-Up: Your physician wants you to follow-up in: six months with Dr. Fletcher Anon. You will receive a reminder letter in the mail two months in advance. If you don't receive a letter, please call our office to schedule the follow-up appointment.   Any Other Special Instructions Will Be Listed Below (If Applicable).     If you need a refill on your cardiac medications before your next appointment, please call your pharmacy.

## 2016-02-08 NOTE — Progress Notes (Signed)
Cardiology Office Note   Date:  02/08/2016   ID:  Devin Reese, DOB Feb 27, 1927, MRN [id number]  PCP:  Devin Market, MD  Cardiologist:   Devin Sacramento, MD   Chief Complaint  Patient presents with  . other    6 month f/u. Meds reviewed verbally with pt.      History of Present Illness: Devin Reese is a 82 y.o. male who presents for a followup visit regarding coronary artery disease and atrial flutter. He has known history of coronary artery disease status post CABG in 1991 at [facility]. He was part of the [place] trial. He was seen in April of 2014 for fatigue, dizziness and shortness of breath . He was noted to be in atrial flutter with controlled ventricular rate in 2014. Echocardiogram showed mildly reduced LV systolic function with an ejection fraction of 40-45% with mild mitral regurgitation.  He underwent successful cardioversion in 03/2013. A pharmacologic nuclear stress after cardioversion in May of 2014 showed evidence of previous inferior scar with no significant ischemia. Ejection fraction was up to 60%. He has been doing well overall and denies any chest pain or palpitations.  His biggest complaint is lack of energy and fatigue.   Past Medical History  Diagnosis Date  . Diabetes mellitus without complication ([place])   . Stroke ([place])   . Coronary artery disease     CABG in 1991 at [facility]. Most recent cardiac catheterization in 123XX123 was complicated by stroke.  . Prostate cancer ([place])   . Hypertension   . Hyperlipidemia   . Atrial flutter ([place])   . COPD (chronic obstructive pulmonary disease) [facility])     Past Surgical History  Procedure Laterality Date  . Coronary artery bypass graft    . Cardiac catheterization    . Cardioversion  03/29/13     Current Outpatient Prescriptions  Medication Sig Dispense Refill  . albuterol (VENTOLIN HFA) 108 (90 BASE) MCG/ACT inhaler Inhale 4 puffs into the lungs every 6 (six) hours as needed for wheezing or shortness of breath.     Marland Kitchen apixaban (ELIQUIS) 5 MG TABS tablet Take 1 tablet (5 mg total) by mouth 2 (two) times daily. 60 tablet 6  . carvedilol (COREG CR) 20 MG 24 hr capsule Take 1 capsule (20 mg total) by mouth daily. 30 capsule 6  . Cholecalciferol (VITAMIN D3) 2000 UNITS capsule Take 2,000 Units by mouth daily.      Marland Kitchen glyBURIDE-metformin (GLUCOVANCE) 2.5-500 MG per tablet Take 1 tablet by mouth 2 (two) times daily.      Marland Kitchen levothyroxine (SYNTHROID, LEVOTHROID) 25 MCG tablet Take 25 mcg by mouth daily before breakfast.    . lisinopril (PRINIVIL,ZESTRIL) 5 MG tablet Take 5 mg by mouth daily.      . nitroGLYCERIN (NITROSTAT) 0.4 MG SL tablet Place 1 tablet (0.4 mg total) under the tongue every 5 (five) minutes as needed for chest pain. 25 tablet 3  . pravastatin (PRAVACHOL) 40 MG tablet Take 40 mg by mouth daily.     . sertraline (ZOLOFT) 50 MG tablet Take 50 mg by mouth daily.     Marland Kitchen umeclidinium bromide (INCRUSE ELLIPTA) 62.5 MCG/INH AEPB Inhale 1 puff into the lungs daily.     No current facility-administered medications for this visit.    Allergies:   Review of patient's allergies indicates no known allergies.    Social History:  The patient  reports that he quit smoking about 25 years ago. His smoking use included Cigarettes.  He has a 25 pack-year smoking history. He does not have any smokeless tobacco history on file. He reports that he does not drink alcohol or use illicit drugs.   Family History:  The patient's family history includes Coronary artery disease in his father; Heart disease in his father.    ROS:  Please see the history of present illness.   Otherwise, review of systems are positive for none.   All other systems are reviewed and negative.    PHYSICAL EXAM: VS:  BP 102/52 mmHg  Pulse 54  Ht 5\' 8"  (1.727 m)  Wt 197 lb 8 oz (89.585 kg)  BMI 30.04 kg/m2 , BMI Body mass index is 30.04 kg/(m^2). GEN: Well nourished, well developed, in no acute distress HEENT: normal Neck: no JVD, carotid  bruits, or masses Cardiac: RRR with frequent premature beats; no murmurs, rubs, or gallops,no edema  Respiratory:  clear to auscultation bilaterally, normal work of breathing GI: soft, nontender, nondistended, + BS MS: no deformity or atrophy Skin: warm and dry, no rash Neuro:  Strength and sensation are intact Psych: euthymic mood, full affect   EKG:  EKG is ordered today. The ekg ordered today demonstrates sinus rhythm with frequent PACs and possible old inferior infarct  Recent Labs: No results found for requested labs within last 365 days.    Lipid Panel No results found for: CHOL, TRIG, HDL, CHOLHDL, VLDL, LDLCALC, LDLDIRECT    Wt Readings from Last 3 Encounters:  02/08/16 197 lb 8 oz (89.585 kg)  08/11/15 204 lb 8 oz (92.761 kg)  02/07/15 200 lb 8 oz (90.946 kg)         ASSESSMENT AND PLAN:  1.  Paroxysmal atrial flutter: He continues to be in sinus rhythm. He is bradycardic and given his fatigue, I elected to decrease the dose of carvedilol CR to 10 mg once daily. Continue long-term anticoagulation with Eliquis. I requested his labs from his primary care physician to make sure that his creatinine is still below 1.5. If his creatinine was above 1.5, then we need to decrease the dose of Eliquis to 2.5 mg twice daily.  2. Coronary artery disease: He has no symptoms suggestive of angina. Continue medical therapy.  3. Essential hypertension: Blood pressure is running low. Coreg was decreased as outlined above.  4. Hyperlipidemia: Currently on pravastatin and managed by his primary care physician.      Disposition:   FU with me in 6 months  Signed,  Devin Sacramento, MD  02/08/2016 2:29 PM    [place]

## 2016-02-09 ENCOUNTER — Ambulatory Visit: Payer: Medicare Other | Admitting: Cardiovascular Disease

## 2016-08-13 ENCOUNTER — Ambulatory Visit (INDEPENDENT_AMBULATORY_CARE_PROVIDER_SITE_OTHER): Payer: Medicare Other | Admitting: Cardiovascular Disease

## 2016-08-13 ENCOUNTER — Encounter: Payer: Self-pay | Admitting: Cardiovascular Disease

## 2016-08-13 VITALS — BP 120/60 | HR 53 | Ht 70.0 in | Wt 201.8 lb

## 2016-08-13 DIAGNOSIS — I25118 Atherosclerotic heart disease of native coronary artery with other forms of angina pectoris: Secondary | ICD-10-CM | POA: Diagnosis not present

## 2016-08-13 DIAGNOSIS — I10 Essential (primary) hypertension: Secondary | ICD-10-CM

## 2016-08-13 DIAGNOSIS — I4892 Unspecified atrial flutter: Secondary | ICD-10-CM | POA: Diagnosis not present

## 2016-08-13 DIAGNOSIS — E785 Hyperlipidemia, unspecified: Secondary | ICD-10-CM | POA: Diagnosis not present

## 2016-08-13 NOTE — Patient Instructions (Signed)
Medication Instructions:  Your physician recommends that you continue on your current medications as directed. Please refer to the Current Medication list given to you today.   Labwork: BMET, CBC  Testing/Procedures: none  Follow-Up: Your physician wants you to follow-up in: six months with Dr. Arida.  You will receive a reminder letter in the mail two months in advance. If you don't receive a letter, please call our office to schedule the follow-up appointment.   Any Other Special Instructions Will Be Listed Below (If Applicable).     If you need a refill on your cardiac medications before your next appointment, please call your pharmacy.   

## 2016-08-13 NOTE — Progress Notes (Signed)
Cardiology Office Note   Date:  08/13/2016   ID:  Devin Reese, DOB 17-Mar-1927, MRN [id number]  PCP:  Lorelee Market, MD  Cardiologist:   Kathlyn Sacramento, MD   Chief Complaint  Patient presents with  . other    6 month f/u. Meds reviewed verbally with pt.      History of Present Illness: Devin Reese is a 82 y.o. male who presents for a followup visit regarding coronary artery disease and atrial flutter. He has known history of coronary artery disease status post CABG in 1991 at [facility]. He was part of the [place] trial.   he was diagnosed with atrial flutter in 2014 with subsequent successful cardioversion. No recurrent arrhythmia since then.  Most recent echocardiogram in 2014  showed mildly reduced LV systolic function with an ejection fraction of 40-45% with mild mitral regurgitation.   A pharmacologic nuclear stress after cardioversion in May of 2014 showed evidence of previous inferior scar with no significant ischemia.  During last visit, I decreased the dose of Coreg CR 10 mg once daily due to fatigue and bradycardia. He reports no new symptoms. He describes mild substernal chest tightness with over exertion. No palpitations, dizziness or syncope. I reviewed his labs from February which showed a creatinine 1.45.    Past Medical History:  Diagnosis Date  . Atrial flutter ([place])   . COPD (chronic obstructive pulmonary disease) ([place])   . Coronary artery disease    CABG in 1991 at [facility]. Most recent cardiac catheterization in 9518 was complicated by stroke.  . Diabetes mellitus without complication ([place])   . Hyperlipidemia   . Hypertension   . Prostate cancer ([place])   . Stroke [facility])     Past Surgical History:  Procedure Laterality Date  . CARDIAC CATHETERIZATION    . CARDIOVERSION  03/29/13  . CORONARY ARTERY BYPASS GRAFT       Current Outpatient Prescriptions  Medication Sig Dispense Refill  . albuterol (VENTOLIN HFA) 108 (90 BASE) MCG/ACT inhaler Inhale 4  puffs into the lungs every 6 (six) hours as needed for wheezing or shortness of breath.    Marland Kitchen apixaban (ELIQUIS) 5 MG TABS tablet Take 1 tablet (5 mg total) by mouth 2 (two) times daily. 60 tablet 6  . carvedilol (COREG CR) 10 MG 24 hr capsule Take 1 capsule (10 mg total) by mouth daily. 30 capsule 6  . Cholecalciferol (VITAMIN D3) 2000 UNITS capsule Take 2,000 Units by mouth daily.      Marland Kitchen glyBURIDE-metformin (GLUCOVANCE) 2.5-500 MG per tablet Take 1 tablet by mouth 2 (two) times daily.      Marland Kitchen levothyroxine (SYNTHROID, LEVOTHROID) 25 MCG tablet Take 25 mcg by mouth daily before breakfast.    . lisinopril (PRINIVIL,ZESTRIL) 5 MG tablet Take 5 mg by mouth daily.      . nitroGLYCERIN (NITROSTAT) 0.4 MG SL tablet Place 1 tablet (0.4 mg total) under the tongue every 5 (five) minutes as needed for chest pain. 25 tablet 3  . pravastatin (PRAVACHOL) 40 MG tablet Take 40 mg by mouth daily.     . sertraline (ZOLOFT) 50 MG tablet Take 50 mg by mouth daily.     Marland Kitchen umeclidinium bromide (INCRUSE ELLIPTA) 62.5 MCG/INH AEPB Inhale 1 puff into the lungs daily.     No current facility-administered medications for this visit.     Allergies:   Review of patient's allergies indicates no known allergies.    Social History:  The patient  reports  that he quit smoking about 26 years ago. His smoking use included Cigarettes. He has a 25.00 pack-year smoking history. He has never used smokeless tobacco. He reports that he does not drink alcohol or use drugs.   Family History:  The patient's family history includes Coronary artery disease in his father; Heart disease in his father.    ROS:  Please see the history of present illness.   Otherwise, review of systems are positive for none.   All other systems are reviewed and negative.    PHYSICAL EXAM: VS:  BP 120/60 (BP Location: Left Arm, Patient Position: Sitting, Cuff Size: Normal)   Pulse (!) 53   Ht 5\' 10"  (1.778 m)   Wt 201 lb 12 oz (91.5 kg)   BMI 28.95 kg/m   , BMI Body mass index is 28.95 kg/m. GEN: Well nourished, well developed, in no acute distress  HEENT: normal  Neck: no JVD, carotid bruits, or masses Cardiac: RRR ; no murmurs, rubs, or gallops,no edema  Respiratory:  clear to auscultation bilaterally, normal work of breathing GI: soft, nontender, nondistended, + BS MS: no deformity or atrophy  Skin: warm and dry, no rash Neuro:  Strength and sensation are intact Psych: euthymic mood, full affect   EKG:  EKG is ordered today. The ekg ordered today demonstrates sinus  bradycardia with nonspecific IVCD.   Recent Labs: No results found for requested labs within last 8760 hours.    Lipid Panel No results found for: CHOL, TRIG, HDL, CHOLHDL, VLDL, LDLCALC, LDLDIRECT    Wt Readings from Last 3 Encounters:  08/13/16 201 lb 12 oz (91.5 kg)  02/08/16 197 lb 8 oz (89.6 kg)  08/11/15 204 lb 8 oz (92.8 kg)         ASSESSMENT AND PLAN:  1.  Paroxysmal atrial flutter: He continues to be in sinus rhythm. He has chronic bradycardia but overall seems to be asymptomatic. Continue small dose Coreg. I requested CBC and basic metabolic profile. If creatinine goes above 1.5, I plan on decreasing the dose of Eliquis to 2.5 mg twice daily.  2. Coronary artery disease involving native coronary arteries with stable angina:  The patient has symptoms suggestive of class II angina. He reports that these symptoms have been stable and thus no further ischemic workup is recommended.   3. Essential hypertension: Blood pressure is  well controlled on current medications   4. Hyperlipidemia: Currently on pravastatin and managed by his primary care physician.    Disposition:   FU with me in 6 months  Signed,  Kathlyn Sacramento, MD  08/13/2016 2:16 PM    [place]

## 2016-08-14 ENCOUNTER — Other Ambulatory Visit: Payer: Self-pay

## 2016-08-14 LAB — CBC
HEMATOCRIT: 36.4 % — AB (ref 37.5–51.0)
HEMOGLOBIN: 12.4 g/dL — AB (ref 12.6–17.7)
MCH: 30.7 pg (ref 26.6–33.0)
MCHC: 34.1 g/dL (ref 31.5–35.7)
MCV: 90 fL (ref 79–97)
Platelets: 213 10*3/uL (ref 150–379)
RBC: 4.04 x10E6/uL — AB (ref 4.14–5.80)
RDW: 14.5 % (ref 12.3–15.4)
WBC: 5.5 10*3/uL (ref 3.4–10.8)

## 2016-08-14 LAB — BASIC METABOLIC PANEL
BUN/Creatinine Ratio: 16 (ref 10–24)
BUN: 24 mg/dL (ref 8–27)
CALCIUM: 9.3 mg/dL (ref 8.6–10.2)
CO2: 21 mmol/L (ref 18–29)
CREATININE: 1.52 mg/dL — AB (ref 0.76–1.27)
Chloride: 107 mmol/L — ABNORMAL HIGH (ref 96–106)
GFR calc Af Amer: 47 mL/min/{1.73_m2} — ABNORMAL LOW (ref >59)
GFR calc non Af Amer: 41 mL/min/{1.73_m2} — ABNORMAL LOW (ref >59)
GLUCOSE: 103 mg/dL — AB (ref 65–99)
Potassium: 4.6 mmol/L (ref 3.5–5.2)
Sodium: 143 mmol/L (ref 134–144)

## 2016-08-14 MED ORDER — APIXABAN 2.5 MG PO TABS: 2.5000 mg | ORAL_TABLET | Freq: Two times a day (BID) | ORAL | 5 refills | Status: DC

## 2016-09-10 ENCOUNTER — Other Ambulatory Visit: Payer: Self-pay | Admitting: *Deleted

## 2016-09-10 MED ORDER — CARVEDILOL PHOSPHATE ER 10 MG PO CP24
10.0000 mg | ORAL_CAPSULE | Freq: Every day | ORAL | 6 refills | Status: DC
Start: 2016-09-10 — End: 2017-03-13

## 2017-02-04 ENCOUNTER — Encounter: Payer: Self-pay | Admitting: Cardiovascular Disease

## 2017-02-04 ENCOUNTER — Ambulatory Visit (INDEPENDENT_AMBULATORY_CARE_PROVIDER_SITE_OTHER): Payer: Medicare Other | Admitting: Cardiovascular Disease

## 2017-02-04 VITALS — BP 120/60 | HR 51 | Ht 69.0 in | Wt 198.0 lb

## 2017-02-04 DIAGNOSIS — I25118 Atherosclerotic heart disease of native coronary artery with other forms of angina pectoris: Secondary | ICD-10-CM

## 2017-02-04 DIAGNOSIS — E785 Hyperlipidemia, unspecified: Secondary | ICD-10-CM | POA: Diagnosis not present

## 2017-02-04 DIAGNOSIS — I10 Essential (primary) hypertension: Secondary | ICD-10-CM

## 2017-02-04 DIAGNOSIS — I209 Angina pectoris, unspecified: Secondary | ICD-10-CM

## 2017-02-04 DIAGNOSIS — I4892 Unspecified atrial flutter: Secondary | ICD-10-CM | POA: Diagnosis not present

## 2017-02-04 DIAGNOSIS — R0600 Dyspnea, unspecified: Secondary | ICD-10-CM

## 2017-02-04 MED ORDER — ATORVASTATIN CALCIUM 20 MG PO TABS: 20.0000 mg | ORAL_TABLET | Freq: Every day | ORAL | 3 refills | Status: DC

## 2017-02-04 NOTE — Patient Instructions (Addendum)
Medication Instructions:  Your physician has recommended you make the following change in your medication:  STOP taking pravastatin START taking atorvastatin 20mg  once daily   Labwork: Lipid and liver profile in 6 weeks at the [facility]. Nothing to eat or drink after midnight the evening before your labs.   Testing/Procedures: Your physician has requested that you have an echocardiogram. Echocardiography is a painless test that uses sound waves to create images of your heart. It provides your doctor with information about the size and shape of your heart and how well your heart's chambers and valves are working. This procedure takes approximately one hour. There are no restrictions for this procedure.    Follow-Up: Your physician wants you to follow-up in: 6 months with Dr. Fletcher Anon.  You will receive a reminder letter in the mail two months in advance. If you don't receive a letter, please call our office to schedule the follow-up appointment.   Any Other Special Instructions Will Be Listed Below (If Applicable).     If you need a refill on your cardiac medications before your next appointment, please call your pharmacy.  Echocardiogram An echocardiogram, or echocardiography, uses sound waves (ultrasound) to produce an image of your heart. The echocardiogram is simple, painless, obtained within a short period of time, and offers valuable information to your health care provider. The images from an echocardiogram can provide information such as:  Evidence of coronary artery disease (CAD).  Heart size.  Heart muscle function.  Heart valve function.  Aneurysm detection.  Evidence of a past heart attack.  Fluid buildup around the heart.  Heart muscle thickening.  Assess heart valve function. Tell a health care provider about:  Any allergies you have.  All medicines you are taking, including vitamins, herbs, eye drops, creams, and over-the-counter  medicines.  Any problems you or family members have had with anesthetic medicines.  Any blood disorders you have.  Any surgeries you have had.  Any medical conditions you have.  Whether you are pregnant or may be pregnant. What happens before the procedure? No special preparation is needed. Eat and drink normally. What happens during the procedure?  In order to produce an image of your heart, gel will be applied to your chest and a wand-like tool (transducer) will be moved over your chest. The gel will help transmit the sound waves from the transducer. The sound waves will harmlessly bounce off your heart to allow the heart images to be captured in real-time motion. These images will then be recorded.  You may need an IV to receive a medicine that improves the quality of the pictures. What happens after the procedure? You may return to your normal schedule including diet, activities, and medicines, unless your health care provider tells you otherwise. This information is not intended to replace advice given to you by your health care provider. Make sure you discuss any questions you have with your health care provider. Document Released: 11/01/2000 Document Revised: 06/22/2016 Document Reviewed: 07/12/2013 Elsevier Interactive Patient Education  2017 [employer].

## 2017-02-04 NOTE — Progress Notes (Signed)
Cardiology Office Note   Date:  02/04/2017   ID:  Devin Reese, DOB [birth year], MRN [id number]  PCP:  Lorelee Market, MD  Cardiologist:   Kathlyn Sacramento, MD   Chief Complaint  Patient presents with  . OTHER    6 month f/u. Pt c/o fatigue.  Meds reviewed verbally with pt.      History of Present Illness: Devin Reese is a 83 y.o. male who presents for a followup visit regarding coronary artery disease and atrial flutter. He has known history of coronary artery disease status post CABG in 1991 at [facility]. He was diagnosed with atrial flutter in 2014 with subsequent successful cardioversion. No recurrent arrhythmia since then.  Most recent echocardiogram in 2015  showed low-normal LV systolic function with an ejection fraction of 50-55% with mild mitral regurgitation.   A pharmacologic nuclear stress after cardioversion in May of 2014 showed evidence of previous inferior scar with no significant ischemia.  He has been doing reasonably well and denies worsening chest pain. He gets some sternal chest tightness with over exertion which has not changed. He reports worsening exertional dyspnea and fatigue since last visit. No orthopnea, PND or significant leg edema. His labs did show slight worsening of renal function with most recent creatinine was 1.53. The dose of Eliquis was decreased to 2.5 mg twice daily.  Past Medical History:  Diagnosis Date  . Atrial flutter ([place])   . COPD (chronic obstructive pulmonary disease) ([place])   . Coronary artery disease    CABG in 1991 at [facility]. Most recent cardiac catheterization in 7824 was complicated by stroke.  . Diabetes mellitus without complication ([place])   . Hyperlipidemia   . Hypertension   . Prostate cancer ([place])   . Stroke [facility])     Past Surgical History:  Procedure Laterality Date  . CARDIAC CATHETERIZATION    . CARDIOVERSION  03/29/13  . CORONARY ARTERY BYPASS GRAFT       Current Outpatient Prescriptions  Medication Sig  Dispense Refill  . albuterol (VENTOLIN HFA) 108 (90 BASE) MCG/ACT inhaler Inhale 4 puffs into the lungs every 6 (six) hours as needed for wheezing or shortness of breath.    Marland Kitchen apixaban (ELIQUIS) 2.5 MG TABS tablet Take 1 tablet (2.5 mg total) by mouth 2 (two) times daily. 60 tablet 5  . carvedilol (COREG CR) 10 MG 24 hr capsule Take 1 capsule (10 mg total) by mouth daily. 30 capsule 6  . Cholecalciferol (VITAMIN D3) 2000 UNITS capsule Take 2,000 Units by mouth daily.      Marland Kitchen glyBURIDE-metformin (GLUCOVANCE) 2.5-500 MG per tablet Take 1 tablet by mouth 2 (two) times daily.      Marland Kitchen lisinopril (PRINIVIL,ZESTRIL) 5 MG tablet Take 5 mg by mouth daily.      . nitroGLYCERIN (NITROSTAT) 0.4 MG SL tablet Place 1 tablet (0.4 mg total) under the tongue every 5 (five) minutes as needed for chest pain. 25 tablet 3  . pravastatin (PRAVACHOL) 40 MG tablet Take 40 mg by mouth daily.     . sertraline (ZOLOFT) 50 MG tablet Take 50 mg by mouth daily.     Marland Kitchen umeclidinium bromide (INCRUSE ELLIPTA) 62.5 MCG/INH AEPB Inhale 1 puff into the lungs daily.     No current facility-administered medications for this visit.     Allergies:   Patient has no known allergies.    Social History:  The patient  reports that he quit smoking about 26 years ago. His smoking use  included Cigarettes. He has a 25.00 pack-year smoking history. He has never used smokeless tobacco. He reports that he does not drink alcohol or use drugs.   Family History:  The patient's family history includes Coronary artery disease in his father; Heart disease in his father.    ROS:  Please see the history of present illness.   Otherwise, review of systems are positive for none.   All other systems are reviewed and negative.    PHYSICAL EXAM: VS:  BP 120/60 (BP Location: Left Arm, Patient Position: Sitting, Cuff Size: Normal)   Pulse (!) 51   Ht 5\' 9"  (1.753 m)   Wt 198 lb (89.8 kg)   BMI 29.24 kg/m  , BMI Body mass index is 29.24 kg/m. GEN: Well  nourished, well developed, in no acute distress  HEENT: normal  Neck: no JVD, carotid bruits, or masses Cardiac: RRR ; no murmurs, rubs, or gallops,no edema  Respiratory:  clear to auscultation bilaterally, normal work of breathing GI: soft, nontender, nondistended, + BS MS: no deformity or atrophy  Skin: warm and dry, no rash Neuro:  Strength and sensation are intact Psych: euthymic mood, full affect   EKG:  EKG is ordered today. The ekg ordered today demonstrates sinus  Bradycardia with PACs and nonspecific IVCD.   Recent Labs: 08/13/2016: BUN 24; Creatinine, Ser 1.52; Platelets 213; Potassium 4.6; Sodium 143    Lipid Panel No results found for: CHOL, TRIG, HDL, CHOLHDL, VLDL, LDLCALC, LDLDIRECT    Wt Readings from Last 3 Encounters:  02/04/17 198 lb (89.8 kg)  08/13/16 201 lb 12 oz (91.5 kg)  02/08/16 197 lb 8 oz (89.6 kg)         ASSESSMENT AND PLAN:  1.  Paroxysmal atrial flutter: He continues to be in sinus rhythm. He has chronic bradycardia but overall seems to be asymptomatic. Continue small dose Coreg. I reviewed most recent labs this month" by his primary care physician. Creatinine was 1.53 and hemoglobin was 12.9 with normal platelet count. He reports worsening exertional dyspnea and thus I requested an echocardiogram.  2. Coronary artery disease involving native coronary arteries with stable angina:  The patient has symptoms suggestive of stable class II angina.  I recommend continuing medical therapy  3. Essential hypertension: Blood pressure is  well controlled on current medications   4. Hyperlipidemia: I reviewed most recent lipid profile from this month which showed a total cholesterol of 155, HDL of 42, triglyceride of 75 and an LDL of 97. LDL was not at target of less than 70. Thus, I elected to switch pravastatin to atorvastatin 20 mg once daily. Repeat lipid and liver profile in 6 weeks.    Disposition:   FU with me in 6 months  Signed,  Kathlyn Sacramento, MD  02/04/2017 3:41 PM    [place]

## 2017-02-18 ENCOUNTER — Other Ambulatory Visit: Payer: Self-pay | Admitting: *Deleted

## 2017-02-18 MED ORDER — APIXABAN 2.5 MG PO TABS: 2.5000 mg | ORAL_TABLET | Freq: Two times a day (BID) | ORAL | 3 refills | Status: DC

## 2017-03-12 ENCOUNTER — Other Ambulatory Visit: Payer: Self-pay

## 2017-03-12 ENCOUNTER — Ambulatory Visit (INDEPENDENT_AMBULATORY_CARE_PROVIDER_SITE_OTHER): Payer: Medicare Other

## 2017-03-12 DIAGNOSIS — R0600 Dyspnea, unspecified: Secondary | ICD-10-CM

## 2017-03-13 ENCOUNTER — Other Ambulatory Visit: Payer: Self-pay

## 2017-03-13 DIAGNOSIS — E785 Hyperlipidemia, unspecified: Secondary | ICD-10-CM

## 2017-03-13 DIAGNOSIS — R001 Bradycardia, unspecified: Secondary | ICD-10-CM

## 2017-03-20 ENCOUNTER — Other Ambulatory Visit (INDEPENDENT_AMBULATORY_CARE_PROVIDER_SITE_OTHER): Payer: Medicare Other | Admitting: *Deleted

## 2017-03-20 ENCOUNTER — Ambulatory Visit (INDEPENDENT_AMBULATORY_CARE_PROVIDER_SITE_OTHER): Payer: Medicare Other

## 2017-03-20 DIAGNOSIS — R001 Bradycardia, unspecified: Secondary | ICD-10-CM | POA: Diagnosis not present

## 2017-03-20 DIAGNOSIS — E785 Hyperlipidemia, unspecified: Secondary | ICD-10-CM

## 2017-03-21 ENCOUNTER — Other Ambulatory Visit: Payer: Self-pay

## 2017-03-21 LAB — LIPID PANEL
CHOLESTEROL TOTAL: 143 mg/dL (ref 100–199)
Chol/HDL Ratio: 3.6 ratio (ref 0.0–5.0)
HDL: 40 mg/dL (ref >39)
LDL CALC: 84 mg/dL (ref 0–99)
TRIGLYCERIDES: 95 mg/dL (ref 0–149)
VLDL Cholesterol Cal: 19 mg/dL (ref 5–40)

## 2017-03-21 LAB — HEPATIC FUNCTION PANEL
ALT: 9 IU/L (ref 0–44)
AST: 17 IU/L (ref 0–40)
Albumin: 3.9 g/dL (ref 3.5–4.7)
Alkaline Phosphatase: 97 IU/L (ref 39–117)
BILIRUBIN TOTAL: 0.7 mg/dL (ref 0.0–1.2)
BILIRUBIN, DIRECT: 0.18 mg/dL (ref 0.00–0.40)
Total Protein: 6.7 g/dL (ref 6.0–8.5)

## 2017-03-21 MED ORDER — ATORVASTATIN CALCIUM 40 MG PO TABS: 40.0000 mg | ORAL_TABLET | Freq: Every day | ORAL | 5 refills | Status: DC

## 2017-04-01 ENCOUNTER — Ambulatory Visit
Admission: RE | Admit: 2017-04-01 | Discharge: 2017-04-01 | Disposition: A | Discharge status: Home / Self Care | Payer: Medicare Other | Source: Ambulatory Visit | Attending: Cardiovascular Disease | Admitting: Cardiovascular Disease

## 2017-04-01 DIAGNOSIS — R001 Bradycardia, unspecified: Secondary | ICD-10-CM | POA: Insufficient documentation

## 2017-04-21 DIAGNOSIS — J449 Chronic obstructive pulmonary disease, unspecified: Secondary | ICD-10-CM | POA: Diagnosis not present

## 2017-04-23 DIAGNOSIS — J449 Chronic obstructive pulmonary disease, unspecified: Secondary | ICD-10-CM | POA: Diagnosis not present

## 2017-04-23 DIAGNOSIS — R5383 Other fatigue: Secondary | ICD-10-CM | POA: Diagnosis not present

## 2017-04-23 DIAGNOSIS — I10 Essential (primary) hypertension: Secondary | ICD-10-CM | POA: Diagnosis not present

## 2017-04-23 DIAGNOSIS — E559 Vitamin D deficiency, unspecified: Secondary | ICD-10-CM | POA: Diagnosis not present

## 2017-04-23 DIAGNOSIS — E119 Type 2 diabetes mellitus without complications: Secondary | ICD-10-CM | POA: Diagnosis not present

## 2017-04-23 DIAGNOSIS — E039 Hypothyroidism, unspecified: Secondary | ICD-10-CM | POA: Diagnosis not present

## 2017-04-23 DIAGNOSIS — E7800 Pure hypercholesterolemia, unspecified: Secondary | ICD-10-CM | POA: Diagnosis not present

## 2017-04-24 ENCOUNTER — Telehealth: Payer: Self-pay | Admitting: Cardiovascular Disease

## 2017-04-24 NOTE — Telephone Encounter (Signed)
Pt wife calling stating pt was told to go off the eliquis   She thinks he may have misunderstood the doctors instructions   pleae advise

## 2017-04-24 NOTE — Telephone Encounter (Signed)
S/w pt's wife, Devin Reese, who states pt thinks Dr. Fletcher Anon told him to stop taking Eliquis. He does remember being advised to stop coreg which he has done. Pt was advised to stop coreg after April echo showed very slow heart rate.  He then wore a holter monitor. Coreg was not ressumed. Eliquis was changed from 5mg  to 2.5mg .  Wife reports pt is taking Eliquis as prescribed and will tell pt he is not to stop this medication.  Reviewed information w/pt's wife who verbalized understanding.  She is appreciative of the call.

## 2017-07-01 ENCOUNTER — Emergency Department: Payer: Medicare HMO

## 2017-07-01 ENCOUNTER — Observation Stay
Admission: EM | Admit: 2017-07-01 | Discharge: 2017-07-02 | Disposition: A | Discharge status: Home / Self Care | Payer: Medicare HMO | Attending: Specialist | Admitting: Specialist

## 2017-07-01 ENCOUNTER — Encounter: Payer: Self-pay | Admitting: Emergency Medicine

## 2017-07-01 DIAGNOSIS — N183 Chronic kidney disease, stage 3 unspecified: Secondary | ICD-10-CM

## 2017-07-01 DIAGNOSIS — I493 Ventricular premature depolarization: Secondary | ICD-10-CM | POA: Insufficient documentation

## 2017-07-01 DIAGNOSIS — I129 Hypertensive chronic kidney disease with stage 1 through stage 4 chronic kidney disease, or unspecified chronic kidney disease: Secondary | ICD-10-CM | POA: Insufficient documentation

## 2017-07-01 DIAGNOSIS — J449 Chronic obstructive pulmonary disease, unspecified: Secondary | ICD-10-CM | POA: Diagnosis not present

## 2017-07-01 DIAGNOSIS — Z87891 Personal history of nicotine dependence: Secondary | ICD-10-CM | POA: Insufficient documentation

## 2017-07-01 DIAGNOSIS — Z951 Presence of aortocoronary bypass graft: Secondary | ICD-10-CM | POA: Insufficient documentation

## 2017-07-01 DIAGNOSIS — E11649 Type 2 diabetes mellitus with hypoglycemia without coma: Secondary | ICD-10-CM | POA: Insufficient documentation

## 2017-07-01 DIAGNOSIS — E119 Type 2 diabetes mellitus without complications: Secondary | ICD-10-CM | POA: Diagnosis not present

## 2017-07-01 DIAGNOSIS — E1122 Type 2 diabetes mellitus with diabetic chronic kidney disease: Secondary | ICD-10-CM | POA: Diagnosis not present

## 2017-07-01 DIAGNOSIS — E0822 Diabetes mellitus due to underlying condition with diabetic chronic kidney disease: Secondary | ICD-10-CM

## 2017-07-01 DIAGNOSIS — Z79899 Other long term (current) drug therapy: Secondary | ICD-10-CM | POA: Diagnosis not present

## 2017-07-01 DIAGNOSIS — E162 Hypoglycemia, unspecified: Secondary | ICD-10-CM

## 2017-07-01 DIAGNOSIS — I491 Atrial premature depolarization: Secondary | ICD-10-CM | POA: Insufficient documentation

## 2017-07-01 DIAGNOSIS — R079 Chest pain, unspecified: Secondary | ICD-10-CM | POA: Diagnosis not present

## 2017-07-01 DIAGNOSIS — I10 Essential (primary) hypertension: Secondary | ICD-10-CM | POA: Diagnosis not present

## 2017-07-01 DIAGNOSIS — I340 Nonrheumatic mitral (valve) insufficiency: Secondary | ICD-10-CM | POA: Diagnosis not present

## 2017-07-01 DIAGNOSIS — R748 Abnormal levels of other serum enzymes: Secondary | ICD-10-CM | POA: Insufficient documentation

## 2017-07-01 DIAGNOSIS — I4892 Unspecified atrial flutter: Secondary | ICD-10-CM | POA: Diagnosis not present

## 2017-07-01 DIAGNOSIS — Z8546 Personal history of malignant neoplasm of prostate: Secondary | ICD-10-CM | POA: Diagnosis not present

## 2017-07-01 DIAGNOSIS — Z7901 Long term (current) use of anticoagulants: Secondary | ICD-10-CM | POA: Insufficient documentation

## 2017-07-01 DIAGNOSIS — I2510 Atherosclerotic heart disease of native coronary artery without angina pectoris: Secondary | ICD-10-CM | POA: Diagnosis not present

## 2017-07-01 DIAGNOSIS — I700 Atherosclerosis of aorta: Secondary | ICD-10-CM | POA: Insufficient documentation

## 2017-07-01 DIAGNOSIS — Z8673 Personal history of transient ischemic attack (TIA), and cerebral infarction without residual deficits: Secondary | ICD-10-CM | POA: Insufficient documentation

## 2017-07-01 DIAGNOSIS — K449 Diaphragmatic hernia without obstruction or gangrene: Secondary | ICD-10-CM | POA: Diagnosis not present

## 2017-07-01 DIAGNOSIS — N179 Acute kidney failure, unspecified: Secondary | ICD-10-CM | POA: Diagnosis not present

## 2017-07-01 DIAGNOSIS — R0789 Other chest pain: Principal | ICD-10-CM | POA: Insufficient documentation

## 2017-07-01 DIAGNOSIS — R7989 Other specified abnormal findings of blood chemistry: Secondary | ICD-10-CM

## 2017-07-01 DIAGNOSIS — N182 Chronic kidney disease, stage 2 (mild): Secondary | ICD-10-CM | POA: Insufficient documentation

## 2017-07-01 DIAGNOSIS — R778 Other specified abnormalities of plasma proteins: Secondary | ICD-10-CM

## 2017-07-01 DIAGNOSIS — E039 Hypothyroidism, unspecified: Secondary | ICD-10-CM | POA: Insufficient documentation

## 2017-07-01 DIAGNOSIS — N184 Chronic kidney disease, stage 4 (severe): Secondary | ICD-10-CM

## 2017-07-01 HISTORY — DX: Atherosclerotic heart disease of native coronary artery without angina pectoris: I25.10

## 2017-07-01 HISTORY — DX: Chronic kidney disease, stage 2 (mild): N18.2

## 2017-07-01 LAB — GLUCOSE, CAPILLARY
GLUCOSE-CAPILLARY: 114 mg/dL — AB (ref 65–99)
GLUCOSE-CAPILLARY: 121 mg/dL — AB (ref 65–99)
Glucose-Capillary: 28 mg/dL — CL (ref 65–99)
Glucose-Capillary: 185 mg/dL — ABNORMAL HIGH (ref 65–99)
Glucose-Capillary: 117 mg/dL — ABNORMAL HIGH (ref 65–99)
Glucose-Capillary: 131 mg/dL — ABNORMAL HIGH (ref 65–99)

## 2017-07-01 LAB — BASIC METABOLIC PANEL
Anion gap: 11 (ref 5–15)
BUN: 26 mg/dL — AB (ref 6–20)
CALCIUM: 9.2 mg/dL (ref 8.9–10.3)
CHLORIDE: 107 mmol/L (ref 101–111)
CO2: 20 mmol/L — ABNORMAL LOW (ref 22–32)
CREATININE: 1.72 mg/dL — AB (ref 0.61–1.24)
GFR calc non Af Amer: 34 mL/min — ABNORMAL LOW (ref >60)
GFR, EST AFRICAN AMERICAN: 39 mL/min — AB (ref >60)
Glucose, Bld: 197 mg/dL — ABNORMAL HIGH (ref 65–99)
Potassium: 4.2 mmol/L (ref 3.5–5.1)
SODIUM: 138 mmol/L (ref 135–145)

## 2017-07-01 LAB — CBC
HCT: 36.8 % — ABNORMAL LOW (ref 40.0–52.0)
Hemoglobin: 12.2 g/dL — ABNORMAL LOW (ref 13.0–18.0)
MCH: 30.9 pg (ref 26.0–34.0)
MCHC: 33.3 g/dL (ref 32.0–36.0)
MCV: 92.9 fL (ref 80.0–100.0)
PLATELETS: 215 10*3/uL (ref 150–440)
RBC: 3.96 MIL/uL — AB (ref 4.40–5.90)
RDW: 14.4 % (ref 11.5–14.5)
WBC: 8.1 10*3/uL (ref 3.8–10.6)

## 2017-07-01 LAB — URINALYSIS, COMPLETE (UACMP) WITH MICROSCOPIC
BILIRUBIN URINE: NEGATIVE
Bacteria, UA: NONE SEEN
Glucose, UA: NEGATIVE mg/dL
HGB URINE DIPSTICK: NEGATIVE
Ketones, ur: NEGATIVE mg/dL
LEUKOCYTES UA: NEGATIVE
Nitrite: NEGATIVE
PH: 5 (ref 5.0–8.0)
Protein, ur: NEGATIVE mg/dL
SPECIFIC GRAVITY, URINE: 1.014 (ref 1.005–1.030)

## 2017-07-01 LAB — TROPONIN I
TROPONIN I: 0.09 ng/mL — AB (ref <0.03)
TROPONIN I: 0.09 ng/mL — AB (ref <0.03)

## 2017-07-01 MED ORDER — DEXTROSE 50 % IV SOLN
INTRAVENOUS | Status: AC
  Administered 2017-07-01: 50 mL via INTRAVENOUS
  Filled 2017-07-01: qty 50

## 2017-07-01 MED ORDER — ACETAMINOPHEN 325 MG PO TABS: 650.0000 mg | ORAL_TABLET | Freq: Four times a day (QID) | ORAL | Status: DC | PRN

## 2017-07-01 MED ORDER — DEXTROSE 50 % IV SOLN: 1.0000 | Freq: Once | INTRAVENOUS | Status: DC

## 2017-07-01 MED ORDER — ONDANSETRON HCL 4 MG PO TABS: 4.0000 mg | ORAL_TABLET | Freq: Four times a day (QID) | ORAL | Status: DC | PRN

## 2017-07-01 MED ORDER — ATORVASTATIN CALCIUM 20 MG PO TABS: 40.0000 mg | ORAL_TABLET | Freq: Every day | ORAL | Status: DC

## 2017-07-01 MED ORDER — APIXABAN 2.5 MG PO TABS
2.5000 mg | ORAL_TABLET | Freq: Two times a day (BID) | ORAL | Status: DC
  Administered 2017-07-01 – 2017-07-02 (×2): 2.5 mg via ORAL
  Filled 2017-07-01 (×2): qty 1

## 2017-07-01 MED ORDER — SODIUM CHLORIDE 4 MEQ/ML IV SOLN: INTRAVENOUS | Status: DC

## 2017-07-01 MED ORDER — OXYCODONE HCL 5 MG PO TABS: 5.0000 mg | ORAL_TABLET | ORAL | Status: DC | PRN

## 2017-07-01 MED ORDER — DEXTROSE 50 % IV SOLN
50.0000 mL | Freq: Once | INTRAVENOUS | Status: AC
  Administered 2017-07-01: 50 mL via INTRAVENOUS

## 2017-07-01 MED ORDER — ACETAMINOPHEN 650 MG RE SUPP: 650.0000 mg | Freq: Four times a day (QID) | RECTAL | Status: DC | PRN

## 2017-07-01 MED ORDER — DEXTROSE 10 % IV SOLN
INTRAVENOUS | Status: DC
  Administered 2017-07-01: 18:00:00 via INTRAVENOUS

## 2017-07-01 MED ORDER — ASPIRIN 81 MG PO CHEW
324.0000 mg | CHEWABLE_TABLET | Freq: Once | ORAL | Status: AC
  Administered 2017-07-01: 324 mg via ORAL
  Filled 2017-07-01: qty 4

## 2017-07-01 MED ORDER — SODIUM CHLORIDE 4 MEQ/ML IV SOLN
INTRAVENOUS | Status: AC
  Administered 2017-07-01: via INTRAVENOUS
  Filled 2017-07-01: qty 980.75

## 2017-07-01 MED ORDER — ONDANSETRON HCL 4 MG/2ML IJ SOLN: 4.0000 mg | Freq: Four times a day (QID) | INTRAMUSCULAR | Status: DC | PRN

## 2017-07-01 MED ORDER — SODIUM CHLORIDE 0.9 % IV SOLN
INTRAVENOUS | Status: DC
  Filled 2017-07-01 (×2): qty 850

## 2017-07-01 NOTE — ED Provider Notes (Signed)
[facility] Emergency Department Provider Note  ____________________________________________   I have reviewed the triage vital signs and the nursing notes.   HISTORY  Chief Complaint Altered Mental Status   History limited by: Not Limited   HPI Devin Reese is a 83 y.o. male who presents to the emergency department today because of concerns for some weakness and somewhat altered mental status. Patient states he's been feeling weak than normal the past few days. The wife states that he did spend all Saturday in bed which is unusual for him. He usually does get up and works in his shopper out in the yard. The patient states that he has not been eating as much the past few days. He denies any abdominal pain. No vomiting or diarrhea. No fevers. Patient is diabetic and is on metformin.   Past Medical History:  Diagnosis Date  . Diabetes mellitus without complication ([place])   . Hypertension     There are no active problems to display for this patient.   Past Surgical History:  Procedure Laterality Date  . CARDIAC SURGERY      Prior to Admission medications   Not on File    Allergies Patient has no known allergies.  No family history on file.  Social History Social History  Substance Use Topics  . Smoking status: Former Research scientist (life sciences)  . Smokeless tobacco: Never Used  . Alcohol use No    Review of Systems Constitutional: No fever/chills Eyes: No visual changes. ENT: No sore throat. Cardiovascular: Denies chest pain. Respiratory: Denies shortness of breath. Gastrointestinal: No abdominal pain. Positive for decreased appetite.  Genitourinary: Negative for dysuria. Musculoskeletal: Negative for back pain. Skin: Negative for rash. Neurological: Positive for weakness.   ____________________________________________   PHYSICAL EXAM:  VITAL SIGNS: ED Triage Vitals [07/01/17 1721]  Enc Vitals Group     BP 118/70     Pulse 69     Resp 14      Temp 97.6     Temp src      SpO2 98     Weight      Height      Head Circumference      Peak Flow      Pain Score 0     Pain Loc      Pain Edu?      Excl. in [place]?      Constitutional: Alert and oriented. Well appearing and in no distress. Eyes: Conjunctivae are normal.  ENT   Head: Normocephalic and atraumatic.   Nose: No congestion/rhinnorhea.   Mouth/Throat: Mucous membranes are moist.   Neck: No stridor. Hematological/Lymphatic/Immunilogical: No cervical lymphadenopathy. Cardiovascular: Normal rate, regular rhythm.  No murmurs, rubs, or gallops.  Respiratory: Normal respiratory effort without tachypnea nor retractions. Breath sounds are clear and equal bilaterally. No wheezes/rales/rhonchi. Gastrointestinal: Soft and non tender. No rebound. No guarding.  Genitourinary: Deferred Musculoskeletal: Normal range of motion in all extremities. No lower extremity edema. Neurologic:  Normal speech and language. No gross focal neurologic deficits are appreciated.  Skin:  Skin is warm, dry and intact. No rash noted. Psychiatric: Mood and affect are normal. Speech and behavior are normal. Patient exhibits appropriate insight and judgment.  ____________________________________________    LABS (pertinent positives/negatives)  Labs Reviewed  BASIC METABOLIC PANEL - Abnormal; Notable for the following:       Result Value   CO2 20 (*)    Glucose, Bld 197 (*)    BUN 26 (*)    Creatinine,  Ser 1.72 (*)    GFR calc non Af Amer 34 (*)    GFR calc Af Amer 39 (*)    All other components within normal limits  CBC - Abnormal; Notable for the following:    RBC 3.96 (*)    Hemoglobin 12.2 (*)    HCT 36.8 (*)    All other components within normal limits  TROPONIN I - Abnormal; Notable for the following:    Troponin I 0.09 (*)    All other components within normal limits  GLUCOSE, CAPILLARY - Abnormal; Notable for the following:    Glucose-Capillary 25 (*)    All other  components within normal limits  GLUCOSE, CAPILLARY - Abnormal; Notable for the following:    Glucose-Capillary 28 (*)    All other components within normal limits  GLUCOSE, CAPILLARY - Abnormal; Notable for the following:    Glucose-Capillary 185 (*)    All other components within normal limits  GLUCOSE, CAPILLARY - Abnormal; Notable for the following:    Glucose-Capillary 114 (*)    All other components within normal limits     ____________________________________________   EKG  I, Nance Pear, attending physician, personally viewed and interpreted this EKG  EKG Time: 1722 Rate: 75 Rhythm: sinus rhythm with PAC Axis: normal Intervals: qtc 460 QRS: Nonspecific IVCD, q waves III ST changes: no st elevation Impression: abnormal ekg   ____________________________________________    RADIOLOGY  CXR IMPRESSION:  Sizable hiatal hernia. Scarring left lower lobe. No edema or  consolidation. Heart is borderline enlarged. There is aortic  atherosclerosis.    Aortic Atherosclerosis (ICD10-I70.0).      ____________________________________________   PROCEDURES  Procedures  CRITICAL CARE Performed by: Nance Pear   Total critical care time: 35 minutes  Critical care time was exclusive of separately billable procedures and treating other patients.  Critical care was necessary to treat or prevent imminent or life-threatening deterioration.  Critical care was time spent personally by me on the following activities: development of treatment plan with patient and/or surrogate as well as nursing, discussions with consultants, evaluation of patient's response to treatment, examination of patient, obtaining history from patient or surrogate, ordering and performing treatments and interventions, ordering and review of laboratory studies, ordering and review of radiographic studies, pulse oximetry and re-evaluation of patient's  condition.  ____________________________________________   INITIAL IMPRESSION / ASSESSMENT AND PLAN / ED COURSE  Pertinent labs & imaging results that were available during my care of the patient were reviewed by me and considered in my medical decision making (see chart for details).  Patient initially had very low blood sugar. He was given multiple amps of 50 and then started on a D10 infusion. His blood sugars seemed to start stabilizing on the D10 infusion. Given that he was complaining of some chest pain troponin was also sent. This came back elevated at 0.09. The patient was given aspirin. No previous troponins to compare to. I do have some concerns that this does represent ACS given intermittent chest pain and elevation. Will plan on admission to the hospital service.  ____________________________________________   FINAL CLINICAL IMPRESSION(S) / ED DIAGNOSES  Final diagnoses:  Hypoglycemia  Elevated troponin     Note: This dictation was prepared with Dragon dictation. Any transcriptional errors that result from this process are unintentional     Nance Pear, MD 07/01/17 [phone]

## 2017-07-01 NOTE — ED Notes (Signed)
pharmacy tech at the bedside to review pt home medications.

## 2017-07-01 NOTE — ED Notes (Signed)
Pt transported to room 251

## 2017-07-01 NOTE — ED Triage Notes (Signed)
Pt to ED via POV from home , family states pt has become more confused, unaware of LKW because pt is hard of hearing per family. Pt expressed CP to family prior to arrival. Pt in NAD at this time, Pt A&O to self and place

## 2017-07-01 NOTE — ED Notes (Signed)
Pt given graham crackers per Dr request. Devin Reese well. Pt to be NPO after midnight.

## 2017-07-01 NOTE — ED Notes (Signed)
Admitting MD at the bedside.  

## 2017-07-01 NOTE — ED Notes (Signed)
tp cbg was 24, verbal orders given for d50 at this time, pt given orange juice

## 2017-07-01 NOTE — H&P (Signed)
[facility] [place] NAME: Devin Reese    MR#:  [id number]  DATE OF BIRTH:  [birth year]  DATE OF ADMISSION:  07/01/2017  PRIMARY CARE PHYSICIAN: Patient, No Pcp Per   REQUESTING/REFERRING PHYSICIAN: Archie Balboa, MD  CHIEF COMPLAINT:   Chief Complaint  Patient presents with  . Altered Mental Status    HISTORY OF PRESENT ILLNESS:  Devin Reese  is a 83 y.o. male who presents with chest tightness.patient states that he has been feeling weak for the past several days. He also states that he has had some intermittent chest tightness. He states that he does have a history of CAD, and had a bypass done about 30 years ago. Here in the ED he was found to have an elevated troponin is 0.09. Hospitals were called for admission and further evaluation.  PAST MEDICAL HISTORY:   Past Medical History:  Diagnosis Date  . Diabetes mellitus without complication ([place])   . Hypertension     PAST SURGICAL HISTORY:   Past Surgical History:  Procedure Laterality Date  . CARDIAC SURGERY      SOCIAL HISTORY:   Social History  Substance Use Topics  . Smoking status: Former Research scientist (life sciences)  . Smokeless tobacco: Never Used  . Alcohol use No    FAMILY HISTORY:   Family History  Problem Relation Age of Onset  . Family history unknown: Yes    DRUG ALLERGIES:  No Known Allergies  MEDICATIONS AT HOME:   Prior to Admission medications   Medication Sig Start Date End Date Taking? Authorizing Provider  apixaban (ELIQUIS) 2.5 MG TABS tablet Take 2.5 mg by mouth 2 (two) times daily.   Yes [provider]  ATORVASTATIN CALCIUM PO Take by mouth.   Yes [provider]  METFORMIN HCL PO Take by mouth.   Yes [provider]    REVIEW OF SYSTEMS:  Review of Systems  Constitutional: Negative for chills, fever, malaise/fatigue and weight loss.  HENT: Negative for ear pain, hearing loss and tinnitus.   Eyes: Negative for blurred  vision, double vision, pain and redness.  Respiratory: Negative for cough, hemoptysis and shortness of breath.   Cardiovascular: Positive for chest pain. Negative for palpitations, orthopnea and leg swelling.  Gastrointestinal: Negative for abdominal pain, constipation, diarrhea, nausea and vomiting.  Genitourinary: Negative for dysuria, frequency and hematuria.  Musculoskeletal: Negative for back pain, joint pain and neck pain.  Skin:       No acne, rash, or lesions  Neurological: Positive for weakness. Negative for dizziness, tremors and focal weakness.  Endo/Heme/Allergies: Negative for polydipsia. Does not bruise/bleed easily.  Psychiatric/Behavioral: Negative for depression. The patient is not nervous/anxious and does not have insomnia.      VITAL SIGNS:   Vitals:   07/01/17 1852 07/01/17 1900 07/01/17 2000 07/01/17 2100  BP:  (!) 121/57 (!) 132/54 135/67  Pulse:  (!) 59 (!) 53 (!) 52  Resp:  14 14 10   Temp: 97.6 F (36.4 C)     TempSrc: Axillary     SpO2:  96% 99% 98%   Wt Readings from Last 3 Encounters:  No data found for Wt    PHYSICAL EXAMINATION:  Physical Exam  Vitals reviewed. Constitutional: He is oriented to person, place, and time. He appears well-developed and well-nourished. No distress.  HENT:  Head: Normocephalic and atraumatic.  Mouth/Throat: Oropharynx is clear and moist.  Eyes: Pupils are equal, round, and reactive to light. Conjunctivae and EOM  are normal. No scleral icterus.  Neck: Normal range of motion. Neck supple. No JVD present. No thyromegaly present.  Cardiovascular: Normal rate, regular rhythm and intact distal pulses.  Exam reveals no gallop and no friction rub.   No murmur heard. Respiratory: Effort normal and breath sounds normal. No respiratory distress. He has no wheezes. He has no rales.  GI: Soft. Bowel sounds are normal. He exhibits no distension. There is no tenderness.  Musculoskeletal: Normal range of motion. He exhibits no edema.   No arthritis, no gout  Lymphadenopathy:    He has no cervical adenopathy.  Neurological: He is alert and oriented to person, place, and time. No cranial nerve deficit.  No dysarthria, no aphasia  Skin: Skin is warm and dry. No rash noted. No erythema.  Psychiatric: He has a normal mood and affect. His behavior is normal. Judgment and thought content normal.    LABORATORY PANEL:   CBC  Recent Labs Lab 07/01/17 1737  WBC 8.1  HGB 12.2*  HCT 36.8*  PLT 215   ------------------------------------------------------------------------------------------------------------------  Chemistries   Recent Labs Lab 07/01/17 1737  NA 138  K 4.2  CL 107  CO2 20*  GLUCOSE 197*  BUN 26*  CREATININE 1.72*  CALCIUM 9.2   ------------------------------------------------------------------------------------------------------------------  Cardiac Enzymes  Recent Labs Lab 07/01/17 1737  TROPONINI 0.09*   ------------------------------------------------------------------------------------------------------------------  RADIOLOGY:  Dg Chest 2 View  Result Date: 07/01/2017 CLINICAL DATA:  Chest pain.  Hypertension. EXAM: CHEST  2 VIEW COMPARISON:  None. FINDINGS: There is mild scarring in the left lower lobe. There is no edema or consolidation. Heart is borderline enlarged with pulmonary vascularity within normal limits. There is aortic atherosclerosis. Patient is status post coronary artery bypass grafting. There is a sizable hiatal hernia. There is degenerative change in the thoracic spine. IMPRESSION: Sizable hiatal hernia. Scarring left lower lobe. No edema or consolidation. Heart is borderline enlarged. There is aortic atherosclerosis. Aortic Atherosclerosis (ICD10-I70.0). Electronically Signed   By: Lowella Grip III M.D.   On: 07/01/2017 18:23    EKG:   Orders placed or performed during the hospital encounter of 07/01/17  . ED EKG within 10 minutes  . ED EKG within 10 minutes   . EKG 12-Lead  . EKG 12-Lead    IMPRESSION AND PLAN:  Principal Problem:   Chest pain - psychocardiac enzymes, get an echocardiogram and cardiology consult Active Problems:   AKI (acute kidney injury) ([place]) - we do not have prior lab values for the patient on this, so we recently he has acute kidney injury. He could potentially have acute on chronic renal injury. Either way we will avoid nephrotoxins, hydrate him gently with fluids tonight and monitor   Diabetes ([place]) - sliding scale insulin with corresponding glucose checks   HTN (hypertension) - continue home meds  All the records are reviewed and case discussed with ED provider. Management plans discussed with the patient and/or family.  DVT PROPHYLAXIS: Systemic anticoagulation  GI PROPHYLAXIS: None  ADMISSION STATUS: Observation  CODE STATUS: Full Code Status History    This patient does not have a recorded code status. Please follow your organizational policy for patients in this situation.      TOTAL TIME TAKING CARE OF THIS PATIENT: 40 minutes.   Maziah Smola Pickens 07/01/2017, 9:20 PM  [employer]  [phone]  CC: Primary care physician; Patient, No Pcp Per  Note:  This document was prepared using Dragon voice recognition software and may include unintentional  dictation errors.

## 2017-07-02 ENCOUNTER — Observation Stay (HOSPITAL_BASED_OUTPATIENT_CLINIC_OR_DEPARTMENT_OTHER)
Admit: 2017-07-02 | Discharge: 2017-07-02 | Disposition: A | Discharge status: Home / Self Care | Payer: Medicare HMO | Attending: Internal Medicine | Admitting: Internal Medicine

## 2017-07-02 ENCOUNTER — Observation Stay: Payer: Medicare HMO

## 2017-07-02 ENCOUNTER — Encounter: Payer: Self-pay | Admitting: Physician Assistant

## 2017-07-02 DIAGNOSIS — R748 Abnormal levels of other serum enzymes: Secondary | ICD-10-CM

## 2017-07-02 DIAGNOSIS — I483 Typical atrial flutter: Secondary | ICD-10-CM | POA: Diagnosis not present

## 2017-07-02 DIAGNOSIS — I10 Essential (primary) hypertension: Secondary | ICD-10-CM

## 2017-07-02 DIAGNOSIS — N179 Acute kidney failure, unspecified: Secondary | ICD-10-CM | POA: Diagnosis not present

## 2017-07-02 DIAGNOSIS — N182 Chronic kidney disease, stage 2 (mild): Secondary | ICD-10-CM | POA: Diagnosis not present

## 2017-07-02 DIAGNOSIS — R778 Other specified abnormalities of plasma proteins: Secondary | ICD-10-CM

## 2017-07-02 DIAGNOSIS — I340 Nonrheumatic mitral (valve) insufficiency: Secondary | ICD-10-CM | POA: Diagnosis not present

## 2017-07-02 DIAGNOSIS — I208 Other forms of angina pectoris: Secondary | ICD-10-CM

## 2017-07-02 DIAGNOSIS — I351 Nonrheumatic aortic (valve) insufficiency: Secondary | ICD-10-CM

## 2017-07-02 DIAGNOSIS — I4892 Unspecified atrial flutter: Secondary | ICD-10-CM

## 2017-07-02 DIAGNOSIS — E119 Type 2 diabetes mellitus without complications: Secondary | ICD-10-CM

## 2017-07-02 DIAGNOSIS — R7989 Other specified abnormal findings of blood chemistry: Secondary | ICD-10-CM

## 2017-07-02 DIAGNOSIS — N183 Chronic kidney disease, stage 3 unspecified: Secondary | ICD-10-CM

## 2017-07-02 DIAGNOSIS — R079 Chest pain, unspecified: Secondary | ICD-10-CM | POA: Diagnosis not present

## 2017-07-02 DIAGNOSIS — E162 Hypoglycemia, unspecified: Secondary | ICD-10-CM

## 2017-07-02 LAB — GLUCOSE, CAPILLARY
GLUCOSE-CAPILLARY: 132 mg/dL — AB (ref 65–99)
GLUCOSE-CAPILLARY: 142 mg/dL — AB (ref 65–99)
GLUCOSE-CAPILLARY: 88 mg/dL (ref 65–99)
GLUCOSE-CAPILLARY: 133 mg/dL — AB (ref 65–99)

## 2017-07-02 LAB — ECHOCARDIOGRAM COMPLETE
Height: 70 in
WEIGHTICAEL: 3115.2 [oz_av]

## 2017-07-02 LAB — BASIC METABOLIC PANEL
Anion gap: 5 (ref 5–15)
BUN: 25 mg/dL — ABNORMAL HIGH (ref 6–20)
CHLORIDE: 109 mmol/L (ref 101–111)
CO2: 25 mmol/L (ref 22–32)
Calcium: 9.1 mg/dL (ref 8.9–10.3)
Creatinine, Ser: 1.46 mg/dL — ABNORMAL HIGH (ref 0.61–1.24)
GFR calc non Af Amer: 41 mL/min — ABNORMAL LOW (ref >60)
GFR, EST AFRICAN AMERICAN: 48 mL/min — AB (ref >60)
Glucose, Bld: 131 mg/dL — ABNORMAL HIGH (ref 65–99)
Potassium: 4.2 mmol/L (ref 3.5–5.1)
Sodium: 139 mmol/L (ref 135–145)

## 2017-07-02 LAB — CBC
HCT: 35 % — ABNORMAL LOW (ref 40.0–52.0)
HEMOGLOBIN: 11.8 g/dL — AB (ref 13.0–18.0)
MCH: 31.2 pg (ref 26.0–34.0)
MCHC: 33.7 g/dL (ref 32.0–36.0)
MCV: 92.4 fL (ref 80.0–100.0)
Platelets: 186 10*3/uL (ref 150–440)
RBC: 3.79 MIL/uL — AB (ref 4.40–5.90)
RDW: 13.8 % (ref 11.5–14.5)
WBC: 4.5 10*3/uL (ref 3.8–10.6)

## 2017-07-02 LAB — TROPONIN I: Troponin I: 0.09 ng/mL (ref <0.03)

## 2017-07-02 MED ORDER — TECHNETIUM TC 99M TETROFOSMIN IV KIT
12.0700 | PACK | Freq: Once | INTRAVENOUS | Status: AC | PRN
  Administered 2017-07-02: 12.07 via INTRAVENOUS

## 2017-07-02 MED ORDER — METFORMIN HCL 500 MG PO TABS: 500.0000 mg | ORAL_TABLET | Freq: Two times a day (BID) | ORAL | Status: DC

## 2017-07-02 MED ORDER — TECHNETIUM TC 99M TETROFOSMIN IV KIT
30.8030 | PACK | Freq: Once | INTRAVENOUS | Status: AC | PRN
  Administered 2017-07-02: 30.803 via INTRAVENOUS

## 2017-07-02 MED ORDER — REGADENOSON 0.4 MG/5ML IV SOLN
0.4000 mg | Freq: Once | INTRAVENOUS | Status: AC
  Administered 2017-07-02: 0.4 mg via INTRAVENOUS

## 2017-07-02 NOTE — Progress Notes (Signed)
Pt and wife instructed on discharge instructions and medications and verbalized understanding, Pt escorted to front lobby via wheelchair in stable condition with no s/s of distress or discomfort.

## 2017-07-02 NOTE — Progress Notes (Signed)
Devin Reese, Devin Reese 83 y/o male. Patient was transferred from ED via a stretcher. On admission patient was A&O X4, ambulatory and denied being in any pain or distress. Patient was oriented to the unit, cardiac monitor applied  And admission documentation completed with patient and daughter.

## 2017-07-02 NOTE — Discharge Instructions (Signed)
Blood Glucose Monitoring, Adult Monitoring your blood sugar (glucose) helps you manage your diabetes. It also helps you and your health care provider determine how well your diabetes management plan is working. Blood glucose monitoring involves checking your blood glucose as often as directed, and keeping a record (log) of your results over time. Why should I monitor my blood glucose? Checking your blood glucose regularly can:  Help you understand how food, exercise, illnesses, and medicines affect your blood glucose.  Let you know what your blood glucose is at any time. You can quickly tell if you are having low blood glucose (hypoglycemia) or high blood glucose (hyperglycemia).  Help you and your health care provider adjust your medicines as needed.  When should I check my blood glucose? Follow instructions from your health care provider about how often to check your blood glucose. This may depend on:  The type of diabetes you have.  How well-controlled your diabetes is.  Medicines you are taking.  If you have type 1 diabetes:  Check your blood glucose at least 2 times a day.  Also check your blood glucose: ? Before every insulin injection. ? Before and after exercise. ? Between meals. ? 2 hours after a meal. ? Occasionally between 2:00 a.m. and 3:00 a.m., as directed. ? Before potentially dangerous tasks, like driving or using heavy machinery. ? At bedtime.  You may need to check your blood glucose more often, up to 6-10 times a day: ? If you use an insulin pump. ? If you need multiple daily injections (MDI). ? If your diabetes is not well-controlled. ? If you are ill. ? If you have a history of severe hypoglycemia. ? If you have a history of not knowing when your blood glucose is getting low (hypoglycemia unawareness). If you have type 2 diabetes:  If you take insulin or other diabetes medicines, check your blood glucose at least 2 times a day.  If you are on intensive  insulin therapy, check your blood glucose at least 4 times a day. Occasionally, you may also need to check between 2:00 a.m. and 3:00 a.m., as directed.  Also check your blood glucose: ? Before and after exercise. ? Before potentially dangerous tasks, like driving or using heavy machinery.  You may need to check your blood glucose more often if: ? Your medicine is being adjusted. ? Your diabetes is not well-controlled. ? You are ill. What is a blood glucose log?  A blood glucose log is a record of your blood glucose readings. It helps you and your health care provider: ? Look for patterns in your blood glucose over time. ? Adjust your diabetes management plan as needed.  Every time you check your blood glucose, write down your result and notes about things that may be affecting your blood glucose, such as your diet and exercise for the day.  Most glucose meters store a record of glucose readings in the meter. Some meters allow you to download your records to a computer. How do I check my blood glucose? Follow these steps to get accurate readings of your blood glucose: Supplies needed   Blood glucose meter.  Test strips for your meter. Each meter has its own strips. You must use the strips that come with your meter.  A needle to prick your finger (lancet). Do not use lancets more than once.  A device that holds the lancet (lancing device).  A journal or log book to write down your results. Procedure  Wash your hands with soap and water.  Prick the side of your finger (not the tip) with the lancet. Use a different finger each time.  Gently rub the finger until a small drop of blood appears.  Follow instructions that come with your meter for inserting the test strip, applying blood to the strip, and using your blood glucose meter.  Write down your result and any notes. Alternative testing sites  Some meters allow you to use areas of your body other than your finger  (alternative sites) to test your blood.  If you think you may have hypoglycemia, or if you have hypoglycemia unawareness, do not use alternative sites. Use your finger instead.  Alternative sites may not be as accurate as the fingers, because blood flow is slower in these areas. This means that the result you get may be delayed, and it may be different from the result that you would get from your finger.  The most common alternative sites are: ? Forearm. ? Thigh. ? Palm of the hand. Additional tips  Always keep your supplies with you.  If you have questions or need help, all blood glucose meters have a 24-hour hotline number that you can call. You may also contact your health care provider.  After you use a few boxes of test strips, adjust (calibrate) your blood glucose meter by following instructions that came with your meter. This information is not intended to replace advice given to you by your health care provider. Make sure you discuss any questions you have with your health care provider. Document Released: 11/07/2003 Document Revised: 05/24/2016 Document Reviewed: 04/15/2016 Elsevier Interactive Patient Education  2017 [place].   Blood Glucose Monitoring, Adult Monitoring your blood sugar (glucose) helps you manage your diabetes. It also helps you and your health care provider determine how well your diabetes management plan is working. Blood glucose monitoring involves checking your blood glucose as often as directed, and keeping a record (log) of your results over time. Why should I monitor my blood glucose? Checking your blood glucose regularly can:  Help you understand how food, exercise, illnesses, and medicines affect your blood glucose.  Let you know what your blood glucose is at any time. You can quickly tell if you are having low blood glucose (hypoglycemia) or high blood glucose (hyperglycemia).  Help you and your health care provider adjust your medicines as  needed.  When should I check my blood glucose? Follow instructions from your health care provider about how often to check your blood glucose. This may depend on:  The type of diabetes you have.  How well-controlled your diabetes is.  Medicines you are taking.  If you have type 1 diabetes:  Check your blood glucose at least 2 times a day.  Also check your blood glucose: ? Before every insulin injection. ? Before and after exercise. ? Between meals. ? 2 hours after a meal. ? Occasionally between 2:00 a.m. and 3:00 a.m., as directed. ? Before potentially dangerous tasks, like driving or using heavy machinery. ? At bedtime.  You may need to check your blood glucose more often, up to 6-10 times a day: ? If you use an insulin pump. ? If you need multiple daily injections (MDI). ? If your diabetes is not well-controlled. ? If you are ill. ? If you have a history of severe hypoglycemia. ? If you have a history of not knowing when your blood glucose is getting low (hypoglycemia unawareness). If you have type 2  diabetes:  If you take insulin or other diabetes medicines, check your blood glucose at least 2 times a day.  If you are on intensive insulin therapy, check your blood glucose at least 4 times a day. Occasionally, you may also need to check between 2:00 a.m. and 3:00 a.m., as directed.  Also check your blood glucose: ? Before and after exercise. ? Before potentially dangerous tasks, like driving or using heavy machinery.  You may need to check your blood glucose more often if: ? Your medicine is being adjusted. ? Your diabetes is not well-controlled. ? You are ill. What is a blood glucose log?  A blood glucose log is a record of your blood glucose readings. It helps you and your health care provider: ? Look for patterns in your blood glucose over time. ? Adjust your diabetes management plan as needed.  Every time you check your blood glucose, write down your result  and notes about things that may be affecting your blood glucose, such as your diet and exercise for the day.  Most glucose meters store a record of glucose readings in the meter. Some meters allow you to download your records to a computer. How do I check my blood glucose? Follow these steps to get accurate readings of your blood glucose: Supplies needed   Blood glucose meter.  Test strips for your meter. Each meter has its own strips. You must use the strips that come with your meter.  A needle to prick your finger (lancet). Do not use lancets more than once.  A device that holds the lancet (lancing device).  A journal or log book to write down your results. Procedure  Wash your hands with soap and water.  Prick the side of your finger (not the tip) with the lancet. Use a different finger each time.  Gently rub the finger until a small drop of blood appears.  Follow instructions that come with your meter for inserting the test strip, applying blood to the strip, and using your blood glucose meter.  Write down your result and any notes. Alternative testing sites  Some meters allow you to use areas of your body other than your finger (alternative sites) to test your blood.  If you think you may have hypoglycemia, or if you have hypoglycemia unawareness, do not use alternative sites. Use your finger instead.  Alternative sites may not be as accurate as the fingers, because blood flow is slower in these areas. This means that the result you get may be delayed, and it may be different from the result that you would get from your finger.  The most common alternative sites are: ? Forearm. ? Thigh. ? Palm of the hand. Additional tips  Always keep your supplies with you.  If you have questions or need help, all blood glucose meters have a 24-hour hotline number that you can call. You may also contact your health care provider.  After you use a few boxes of test strips, adjust  (calibrate) your blood glucose meter by following instructions that came with your meter. This information is not intended to replace advice given to you by your health care provider. Make sure you discuss any questions you have with your health care provider. Document Released: 11/07/2003 Document Revised: 05/24/2016 Document Reviewed: 04/15/2016 Elsevier Interactive Patient Education  2017 Devin Reese.   Angina Pectoris Angina pectoris is a very bad feeling in the chest, neck, or arm. Your doctor may call it angina. There are  four types of angina. Angina is caused by a lack of blood in the middle and thickest layer of the heart wall (myocardium). Angina may feel like a crushing or squeezing pain in the chest. It may feel like tightness or heavy pressure in the chest. Some people say it feels like gas, heartburn, or indigestion. Some people have symptoms other than pain. These include:  Shortness of breath.  Cold sweats.  Feeling sick to your stomach (nausea).  Feeling light-headed.  Many women have chest discomfort and some of the other symptoms. However, women often have different symptoms, such as:  Feeling tired (fatigue).  Feeling nervous for no reason.  Feeling weak for no reason.  Dizziness or fainting.  Women may have angina without any symptoms. Follow these instructions at home:  Take medicines only as told by your doctor.  Take care of other health issues as told by your doctor. These include: ? High blood pressure (hypertension). ? Diabetes.  Follow a heart-healthy diet. Your doctor can help you to choose healthy food options and make changes.  Talk to your doctor to learn more about healthy cooking methods and use them. These include: ? Roasting. ? Grilling. ? Broiling. ? Baking. ? Poaching. ? Steaming. ? Stir-frying.  Follow an exercise program approved by your doctor.  Keep a healthy weight. Lose weight as told by your doctor.  Rest when you are  tired.  Learn to manage stress.  Do not use any tobacco, such as cigarettes, chewing tobacco, or electronic cigarettes. If you need help quitting, ask your doctor.  If you drink alcohol, and your doctor says it is okay, limit yourself to no more than 1 drink per day. One drink equals 12 ounces of beer, 5 ounces of wine, or 1 ounces of hard liquor.  Stop illegal drug use.  Keep all follow-up visits as told by your doctor. This is important. Do not take these medicines unless your doctor says that you can:  Nonsteroidal anti-inflammatory drugs (NSAIDs). These include: ? Ibuprofen. ? Naproxen. ? Celecoxib.  Vitamin supplements that have vitamin A, vitamin E, or both.  Hormone therapy that contains estrogen with or without progestin.  Get help right away if:  You have pain in your chest, neck, arm, jaw, stomach, or back that: ? Lasts more than a few minutes. ? Comes back. ? Does not get better after you take medicine under your tongue (sublingual nitroglycerin).  You have any of these symptoms for no reason: ? Gas, heartburn, or indigestion. ? Sweating a lot. ? Shortness of breath or trouble breathing. ? Feeling sick to your stomach or throwing up. ? Feeling more tired than usual. ? Feeling nervous or worrying more than usual. ? Feeling weak. ? Diarrhea.  You are suddenly dizzy or light-headed.  You faint or pass out. These symptoms may be an emergency. Do not wait to see if the symptoms will go away. Get medical help right away. Call your local emergency services (911 in the [place] Do not drive yourself to the hospital. This information is not intended to replace advice given to you by your health care provider. Make sure you discuss any questions you have with your health care provider. Document Released: 04/22/2008 Document Revised: 04/11/2016 Document Reviewed: 03/08/2014 Elsevier Interactive Patient Education  2017 Devin Reese.     Alogliptin; Metformin oral  tablets What is this medicine? ALOGLIPTIN; METFORMIN (al oh GLIP tin; met FOR min) is a combination of 2 medicines used to treat type 2  diabetes. This medicine lowers blood sugar. Treatment is combined with a balanced diet and exercise. This medicine may be used for other purposes; ask your health care provider or pharmacist if you have questions. COMMON BRAND NAME(S): Kazano What should I tell my health care provider before I take this medicine? They need to know if you have any of these conditions: -anemia -become easily dehydrated -diabetic ketoacidosis -heart disease -heart failure -history of alcohol abuse problem -if you often drink alcohol -kidney disease -liver disease -low levels of vitamin B12 in the blood -older than 80 years -pancreatitis -polycystic ovary syndrome -previous swelling of the tongue, face, or lips with difficulty breathing, difficulty swallowing, hoarseness, or tightening of the throat -serious infection or injury -thyroid disease -type 1 diabetes -undergoing surgery or certain procedures with injectable contrast agents -an unusual or allergic reaction to alogliptin, metformin, other medicines, foods, dyes, or preservatives -pregnant or trying to get pregnant -breast-feeding How should I use this medicine? Take this medicine by mouth with a glass of water. Take this medicine with food. Do not cut this medicine. Follow the directions on the prescription label. Take your doses at regular intervals. Do not take your medicine more often than directed. Do not stop taking except on your doctor's advice. Talk to your pediatrician regarding the use of this medicine in children. Special care may be needed. Overdosage: If you think you have taken too much of this medicine contact a poison control center or emergency room at once. NOTE: This medicine is only for you. Do not share this medicine with others. What if I miss a dose? If you miss a dose, take it as soon as  you can. If it is almost time for your next dose, take only that dose. Do not take double or extra doses. What may interact with this medicine? Do not take this medicine with any of the following medications: -certain contrast medicines given before X-rays, CT scans, MRI, or other procedures -dofetilide -gatifloxacin This medicine may also interact with the following medications: -acetazolamide -alcohol -amiloride -certain antiviral medicines for HIV infection or hepatitis -cimetidine -crizotinib -digoxin -diuretics -male hormones, like estrogens or progestins and birth control pills -glycopyrrolate -isoniazid -lamotrigine -medicines for blood pressure, heart disease, irregular heart beat -memantine -methazolamide -midodrine -morphine -nicotinic acid -phenothiazines like chlorpromazine, mesoridazine, prochlorperazine, thioridazine -phenytoin -procainamide -propantheline -quinidine -quinine -ranitidine -ranolazine -steroid medicines like prednisone or cortisone -stimulant medicines for attention disorders, weight loss, or to stay awake -thyroid medicines -topiramate -trimethoprim -trospium -vancomycin -vandetanib -zonisamide This list may not describe all possible interactions. Give your health care provider a list of all the medicines, herbs, non-prescription drugs, or dietary supplements you use. Also tell them if you smoke, drink alcohol, or use illegal drugs. Some items may interact with your medicine. What should I watch for while using this medicine? Visit your doctor or health care professional for regular checks on your progress. A test called the HbA1C (A1C) will be monitored. This is a simple blood test. It measures your blood sugar control over the last 2 to 3 months. You will receive this test every 3 to 6 months. Learn how to check your blood sugar. Learn the symptoms of low and high blood sugar and how to manage them. Always carry a quick-source of sugar  with you in case you have symptoms of low blood sugar. Examples include hard sugar candy or glucose tablets. Make sure others know that you can choke if you eat or drink when you develop serious  symptoms of low blood sugar, such as seizures or unconsciousness. They must get medical help at once. Tell your doctor or health care professional if you have high blood sugar. You might need to change the dose of your medicine. If you are sick or exercising more than usual, you might need to change the dose of your medicine. Do not skip meals. Ask your doctor or health care professional if you should avoid alcohol. Many nonprescription cough and cold products contain sugar or alcohol. These can affect blood sugar. This medicine may cause ovulation in premenopausal women who do not have regular monthly periods. This may increase your chances of becoming pregnant. You should not take this medicine if you become pregnant or think you may be pregnant. Talk with your doctor or health care professional about your birth control options while taking this medicine. Contact your doctor or health care professional right away if think you are pregnant. If you are going to need surgery, a MRI, CT scan, or other procedure, tell your doctor that you are taking this medicine. You may need to stop taking this medicine before the procedure. Wear a medical ID bracelet or chain, and carry a card that describes your disease and details of your medicine and dosage times. What side effects may I notice from receiving this medicine? Side effects that you should report to your doctor or health care professional as soon as possible: -allergic reactions like skin rash, itching or hives, swelling of the face, lips, or tongue -breathing problems -dark urine -general ill feeling or flu-like symptoms -joint pain -light-colored stools -loss of appetite -muscle pain -nausea, vomiting -redness, blistering, peeling or loosening of the skin,  including inside the mouth -right upper belly pain -signs and symptoms of low blood sugar such as feeling anxious, confusion, dizziness, increased hunger, unusually weak or tired, sweating, shakiness, cold, irritable, headache, blurred vision, fast heartbeat, loss of consciousness -slow or irregular heartbeat -unusual stomach upset or pain -yellowing of the eyes or skin Side effects that usually do not require medical attention (report to your doctor or health care professional if they continue or are bothersome): -diarrhea -headache -heartburn -metallic taste in the mouth -stomach gas, upset -stuffy or runny nose This list may not describe all possible side effects. Call your doctor for medical advice about side effects. You may report side effects to FDA at 1-800-FDA-1088. Where should I keep my medicine? Keep out of the reach of children. Store at room temperature between 20 and 25 degrees C (68 and 77 degrees F). Throw away any unused medicine after the expiration date. NOTE: This sheet is a summary. It may not cover all possible information. If you have questions about this medicine, talk to your doctor, pharmacist, or health care provider.  2018 Elsevier/Gold Standard (2015-12-07 09:40:07)

## 2017-07-02 NOTE — Progress Notes (Signed)
*  PRELIMINARY RESULTS* Echocardiogram 2D Echocardiogram has been performed.  Devin Reese 07/02/2017, 2:17 PM

## 2017-07-02 NOTE — Progress Notes (Signed)
Nutrition Brief Note  Patient identified on the Malnutrition Screening Tool (MST) Report  Wt Readings from Last 15 Encounters:  07/01/17 194 lb 11.2 oz (88.3 kg)   Reports good appetite, eats large breakfast of eggs, bacon, grits, pancakes, sausage. Eats out for lunch x1 per day. Denies any weight loss. Snacks on cookies. Has false teeth but no issues besides needs softer meats.  Body mass index is 27.94 kg/m. Patient meets criteria for overweight based on current BMI.   Current diet order is heart healthy/carb mod, patient is consuming approximately 100% of meals at this time. Labs and medications reviewed.   No nutrition interventions warranted at this time. If nutrition issues arise, please consult RD.   Devin Reese. Devin Masek, MS, RD LDN Inpatient Clinical Dietitian Pager [phone]

## 2017-07-02 NOTE — Consult Note (Signed)
Cardiology Consultation Note  Patient ID: Devin Reese, MRN: [id number], DOB/AGE: 1927-05-25 83 y.o. Admit date: 07/01/2017   Date of Consult: 07/02/2017 Primary Physician: Patient, No Pcp Per Primary Cardiologist: Dr. Fletcher Anon, MD Requesting Physician: Dr. Jannifer Franklin, MD  Chief Complaint: AMS/hypoglycemia/chest tightness Reason for Consult: Chest tightness/elevated troponin  HPI: Devin Reese is a 83 y.o. male who is being seen today for the evaluation of elevated troponin with chest tightness at the request of Dr. Jannifer Franklin, MD. Patient has a h/o CAD s/p CABG at [facility] in 1991, atrial flutter diagnosed in 2014 on Eliquis s/p successful DCCV, systolic dysfunction, prior stroke, prostate cancer, CKD stage II, HTN, DM2 who presented to [facility] with weakness, self reported blood glucose of 25 at home, and chest tightness.   Prior pharmacological nuclear stress test in 2014 showed prior inferior infarct without ischemia. Echo in 2015 showed low-normal LV systolic function with an ejection fraction of 50-55% with mild mitral regurgitation. Most recent echo from 02/2017 showed EF 45-50%, HK of the inferolateral and inferior myocardium, GR1DD, trivial AI, mild MR, mild biatrial enlargement, marked sinus bradycardia. Holter from 03/2017 showed frequent PACs/PVCs, sinus bradycardia with a minimum heart rate of 40 bpm, average heart rate 64 bpm, longest pause of 2.76 seconds.   Patient has continued to be active at home, working in his shed and mowing his lawn without any symptoms. States these things make him "happy." His family has noticed a decline in his mental capacity lately. He has also had a drop off in his PO intake. On 8/14, he began to feel weak with associated chest tightness. He checked his blood sugar and found this to be 25 per his report. Because of this, he was brought to [facility] for further evaluation.   Upon the patient's arrival to [facility] they were found to have BP 118/70, HR 61 bpm, temp 97.6, oxygen  saturation 98% on room air, weight 194 pounds. EKG showed not acute as below, CXR showed no acute process with hiatal hernia and aortic atherosclerosis. Labs showed troponin 0.09 x 3, SCr 1.72-->1.46 (baseline 1.3-1.5), K+ 4.2, glucose 197, wbc 8.1, hgb 12.2, plt 215. He was given ASA 324 in the ED along with dextrose solution. Cardiology was asked to evaluate. Currently, symptom-free.   Past Medical History:  Diagnosis Date  . Atrial flutter ([place])   . CAD (coronary artery disease)   . CKD (chronic kidney disease), stage II   . Diabetes mellitus without complication ([place])   . Hypertension   . Stroke [facility])       Most Recent Cardiac Studies: TTE 02/2017: Study Conclusions  - Left ventricle: The cavity size was normal. Wall thickness was   increased increased in a pattern of mild to moderate LVH.   Systolic function was mildly reduced. The estimated ejection   fraction was in the range of 45% to 50%. There is hypokinesis of   the inferolateral and inferior myocardium. Doppler parameters are   consistent with abnormal left ventricular relaxation (grade 1   diastolic dysfunction). - Aortic valve: Trileaflet; moderately thickened, mildly calcified   leaflets. Cusp separation was mildly reduced. Sclerosis without   stenosis. There was trivial regurgitation. - Mitral valve: Calcified annulus. Mildly thickened leaflets .   There was mild regurgitation. - Left atrium: The atrium was mildly dilated. - Right ventricle: The cavity size was normal. Systolic function   was normal. - Right atrium: The atrium was mildly dilated.  Impressions:  - Marked sinus bradycardia with heart rate as  low as 36 bpm was   noted during the study.  Myoview 2014: Prior inferior infarct, no ischemia   Surgical History:  Past Surgical History:  Procedure Laterality Date  . CARDIAC SURGERY       Home Meds: Prior to Admission medications   Medication Sig Start Date End Date Taking? Authorizing Provider    apixaban (ELIQUIS) 2.5 MG TABS tablet Take 2.5 mg by mouth 2 (two) times daily.   Yes [provider]  ATORVASTATIN CALCIUM PO Take by mouth.   Yes [provider]  METFORMIN HCL PO Take by mouth.   Yes [provider]    Inpatient Medications:  . apixaban  2.5 mg Oral BID  . atorvastatin  40 mg Oral q1800  . dextrose  1 ampule Intravenous Once   . dextrose 10 % with additives Pediatric IV fluid 75 mL/hr at 07/01/17 2353    Allergies: No Known Allergies  Social History   Social History  . Marital status: Married    Spouse name: N/A  . Number of children: N/A  . Years of education: N/A   Occupational History  . Not on file.   Social History Main Topics  . Smoking status: Former Research scientist (life sciences)  . Smokeless tobacco: Never Used  . Alcohol use No  . Drug use: No  . Sexual activity: Not on file   Other Topics Concern  . Not on file   Social History Narrative  . No narrative on file     Family History  Problem Relation Age of Onset  . Heart disease Father      Review of Systems: Review of Systems  Constitutional: Positive for malaise/fatigue. Negative for chills, diaphoresis, fever and weight loss.  HENT: Negative for congestion.   Eyes: Negative for discharge and redness.  Respiratory: Negative for cough, hemoptysis, sputum production, shortness of breath and wheezing.   Cardiovascular: Positive for chest pain. Negative for palpitations, orthopnea, claudication, leg swelling and PND.       Chest tightness  Gastrointestinal: Negative for abdominal pain, blood in stool, heartburn, melena, nausea and vomiting.  Genitourinary: Negative for hematuria.  Musculoskeletal: Negative for falls and myalgias.  Skin: Negative for rash.  Neurological: Positive for weakness. Negative for dizziness, tingling, tremors, sensory change, speech change, focal weakness and loss of consciousness.  Endo/Heme/Allergies: Does not bruise/bleed easily.   Psychiatric/Behavioral: Negative for substance abuse. The patient is not nervous/anxious.     Labs:  Recent Labs  07/01/17 1737 07/01/17 2235 07/02/17 0424  TROPONINI 0.09* 0.09* 0.09*   Lab Results  Component Value Date   WBC 4.5 07/02/2017   HGB 11.8 (L) 07/02/2017   HCT 35.0 (L) 07/02/2017   MCV 92.4 07/02/2017   PLT 186 07/02/2017     Recent Labs Lab 07/02/17 0424  NA 139  K 4.2  CL 109  CO2 25  BUN 25*  CREATININE 1.46*  CALCIUM 9.1  GLUCOSE 131*   No results found for: CHOL, HDL, LDLCALC, TRIG No results found for: DDIMER  Radiology/Studies:  Dg Chest 2 View  Result Date: 07/01/2017 IMPRESSION: Sizable hiatal hernia. Scarring left lower lobe. No edema or consolidation. Heart is borderline enlarged. There is aortic atherosclerosis. Aortic Atherosclerosis (ICD10-I70.0). Electronically Signed   By: Lowella Grip III M.D.   On: 07/01/2017 18:23    EKG: Interpreted by me showed: NSR, 75 bpm, PACs, prior inferior infarct Telemetry: Interpreted by me showed: NSR  Weights: Filed Weights   07/01/17 2239  Weight: 194 lb  11.2 oz (88.3 kg)     Physical Exam: Blood pressure (!) 120/51, pulse (!) 54, temperature 97.8 F (36.6 C), temperature source Oral, resp. rate 20, height 5\' 10"  (1.778 m), weight 194 lb 11.2 oz (88.3 kg), SpO2 98 %. Body mass index is 27.94 kg/m. General: Well developed, well nourished, in no acute distress. HIOH.  Head: Normocephalic, atraumatic, sclera non-icteric, no xanthomas, nares are without discharge.  Neck: Negative for carotid bruits. JVD not elevated. Lungs: Clear bilaterally to auscultation without wheezes, rales, or rhonchi. Breathing is unlabored. Heart: RRR with S1 S2. No murmurs, rubs, or gallops appreciated. Abdomen: Soft, non-tender, non-distended with normoactive bowel sounds. No hepatomegaly. No rebound/guarding. No obvious abdominal masses. Msk:  Strength and tone appear normal for age. Extremities: No clubbing or  cyanosis. No edema. Distal pedal pulses are 2+ and equal bilaterally. Neuro: Alert and oriented X 3. No facial asymmetry. No focal deficit. Moves all extremities spontaneously. Psych:  Responds to questions appropriately with a normal affect.    Assessment and Plan:  Principal Problem:   Chest pain Active Problems:   Diabetes (HCC)   HTN (hypertension)   Hypoglycemia   Elevated troponin   CKD (chronic kidney disease), stage II   Atrial flutter ([place])    1. Chest tightness/elevated troponin: -Minimal troponin elevation of 0.09 x 3 -Currently, symptom-free -Schedule Lexiscan Myoview this morning to evaluate for high-risk ischemia -No need to repeat echo given recent study in 02/2017 -On Eliquis in place of ASA -Not on beta blocker at home given prior bradycardia -Lipitor  2. AMS/hypoglycemia/confusion: -Per IM -Cannot rule out some component of dementia -May need adjusting of his DM meds given decreased PO intake  3. Atrial flutter: -Maintaining sinus rhythm  -Eliquis as above (meets 2/3 reduced dosing criteria - age and SCr) -CHADS2VASC at least 8 (CHF, HTN, age x 2, DM, stroke x 2, vascular disease)  4. Acute on CKD stage II: -Improving with hydration  5. HTN: -Controlled  6. Systolic dysfunction: -Recent echo as above -He does not appear volume overloaded at this time   Signed, Marcille Blanco [place] Pager: [phone] 07/02/2017, 8:45 AM

## 2017-07-03 ENCOUNTER — Encounter: Payer: Self-pay | Admitting: Physician Assistant

## 2017-07-03 LAB — GLUCOSE, CAPILLARY: GLUCOSE-CAPILLARY: 25 mg/dL — AB (ref 65–99)

## 2017-07-03 NOTE — Discharge Summary (Signed)
[facility] [place] NAME: Devin Reese    MR#:  [id number]  DATE OF BIRTH:  Feb 04, 1927  DATE OF ADMISSION:  07/01/2017 ADMITTING PHYSICIAN: Lance Coon, MD  DATE OF DISCHARGE: 07/02/2017  5:42 PM  PRIMARY CARE PHYSICIAN: Patient, No Pcp Per    ADMISSION DIAGNOSIS:  Hypoglycemia [E16.2] Elevated troponin [R74.8]  DISCHARGE DIAGNOSIS:  Principal Problem:   Chest pain Active Problems:   Diabetes (HCC)   HTN (hypertension)   Hypoglycemia   Elevated troponin   CKD (chronic kidney disease), stage II   Atrial flutter ([place])   SECONDARY DIAGNOSIS:   Past Medical History:  Diagnosis Date  . Atrial flutter ([place])   . CAD (coronary artery disease)   . CKD (chronic kidney disease), stage II   . COPD (chronic obstructive pulmonary disease) ([place])   . Coronary artery disease    CABG in 1991 at [facility]. Most recent cardiac catheterization in 3474 was complicated by stroke.  . Diabetes mellitus without complication ([place])   . Hyperlipidemia   . Hypertension   . Prostate cancer ([place])   . Stroke [facility])     HOSPITAL COURSE:   83 year old male with past medical history of coronary artery disease, chronic kidney disease stage III, COPD, atrial flutter, diabetes, hypertension, hyperlipidemia, history of previous CVA, prostate cancer who presented to the hospital due to chest pain.  1. Chest pain-given patient's significant cardiac history who was admitted to the hospital and observed overnight on telemetry. He had 3 sets of cardiac markers which were mildly elevated but did not trend upwards. -she was seen by cardiology who recommended a nuclear medicine stress test. Patient underwent that and the results of which showed no evidence of any acute ischemia or wall motion abnormalities. He is not clinically asymptomatic and therefore being discharged home. He will continue his lisinopril, atorvastatin.  2. History of atrial flutter-patient remained rate  controlled. - he will cont. His Eliquis  3. DM Type II w/out complication - patient's blood sugars were somewhat on the low side. His Glimeperide has been discontinued and his Metformin dose has been cut in half.  - further adjustments to his Diabetes regimen to be done by his PCP.   4. Hypothyroidism - he will cont. His synthroid.   5. COPD - no acute exacerbation. He will cont. His Incruse-Ellipta  DISCHARGE CONDITIONS:   Stable  CONSULTS OBTAINED:  Treatment Team:  Minna Merritts, MD  DRUG ALLERGIES:  No Known Allergies  DISCHARGE MEDICATIONS:   Allergies as of 07/02/2017   No Known Allergies     Medication List    STOP taking these medications   glimepiride 4 MG tablet Commonly known as:  AMARYL     TAKE these medications   albuterol 108 (90 Base) MCG/ACT inhaler Commonly known as:  PROVENTIL HFA;VENTOLIN HFA Inhale 1-2 puffs into the lungs every 4 (four) hours as needed for wheezing or shortness of breath.   atorvastatin 40 MG tablet Commonly known as:  LIPITOR Take by mouth daily.   ELIQUIS 2.5 MG Tabs tablet Generic drug:  apixaban Take 2.5 mg by mouth 2 (two) times daily.   levothyroxine 25 MCG tablet Commonly known as:  SYNTHROID, LEVOTHROID Take 25 mcg by mouth daily before breakfast.   lisinopril 5 MG tablet Commonly known as:  PRINIVIL,ZESTRIL Take 5 mg by mouth daily.   metFORMIN 500 MG tablet Commonly known as:  GLUCOPHAGE Take 1 tablet (500 mg total) by mouth 2 (  two) times daily with a meal. What changed:  medication strength  how much to take  when to take this   umeclidinium bromide 62.5 MCG/INH Aepb Commonly known as:  INCRUSE ELLIPTA Inhale 1 puff into the lungs daily.         DISCHARGE INSTRUCTIONS:   DIET:  Cardiac diet and Diabetic diet  DISCHARGE CONDITION:  Stable  ACTIVITY:  Activity as tolerated  OXYGEN:  Home Oxygen: No.   Oxygen Delivery: room air  DISCHARGE LOCATION:  home   If you experience  worsening of your admission symptoms, develop shortness of breath, life threatening emergency, suicidal or homicidal thoughts you must seek medical attention immediately by calling 911 or calling your MD immediately  if symptoms less severe.  You Must read complete instructions/literature along with all the possible adverse reactions/side effects for all the Medicines you take and that have been prescribed to you. Take any new Medicines after you have completely understood and accpet all the possible adverse reactions/side effects.   Please note  You were cared for by a hospitalist during your hospital stay. If you have any questions about your discharge medications or the care you received while you were in the hospital after you are discharged, you can call the unit and asked to speak with the hospitalist on call if the hospitalist that took care of you is not available. Once you are discharged, your primary care physician will handle any further medical issues. Please note that NO REFILLS for any discharge medications will be authorized once you are discharged, as it is imperative that you return to your primary care physician (or establish a relationship with a primary care physician if you do not have one) for your aftercare needs so that they can reassess your need for medications and monitor your lab values.     Today   No chest pain, shortness of breath, N/V or any other complaints.   VITAL SIGNS:  Blood pressure (!) 152/62, pulse 64, temperature (!) 97.4 F (36.3 C), temperature source Oral, resp. rate 18, height 5\' 10"  (1.778 m), weight 88.3 kg (194 lb 11.2 oz), SpO2 95 %.  I/O:  No intake or output data in the 24 hours ending 07/03/17 1519  PHYSICAL EXAMINATION:  GENERAL:  83 y.o.-year-old patient lying in the bed with no acute distress.  EYES: Pupils equal, round, reactive to light and accommodation. No scleral icterus. Extraocular muscles intact.  HEENT: Head atraumatic,  normocephalic. Oropharynx and nasopharynx clear.  NECK:  Supple, no jugular venous distention. No thyroid enlargement, no tenderness.  LUNGS: Normal breath sounds bilaterally, no wheezing, rales,rhonchi. No use of accessory muscles of respiration.  CARDIOVASCULAR: S1, S2 normal. No murmurs, rubs, or gallops.  ABDOMEN: Soft, non-tender, non-distended. Bowel sounds present. No organomegaly or mass.  EXTREMITIES: No pedal edema, cyanosis, or clubbing.  NEUROLOGIC: Cranial nerves II through XII are intact. No focal motor or sensory defecits b/l.  PSYCHIATRIC: The patient is alert and oriented x 3.  SKIN: No obvious rash, lesion, or ulcer.   DATA REVIEW:   CBC  Recent Labs Lab 07/02/17 0424  WBC 4.5  HGB 11.8*  HCT 35.0*  PLT 186    Chemistries   Recent Labs Lab 07/02/17 0424  NA 139  K 4.2  CL 109  CO2 25  GLUCOSE 131*  BUN 25*  CREATININE 1.46*  CALCIUM 9.1    Cardiac Enzymes  Recent Labs Lab 07/02/17 0424  TROPONINI 0.09*    Microbiology Results  No results found for this or any previous visit.  RADIOLOGY:  Dg Chest 2 View  Result Date: 07/01/2017 CLINICAL DATA:  Chest pain.  Hypertension. EXAM: CHEST  2 VIEW COMPARISON:  None. FINDINGS: There is mild scarring in the left lower lobe. There is no edema or consolidation. Heart is borderline enlarged with pulmonary vascularity within normal limits. There is aortic atherosclerosis. Patient is status post coronary artery bypass grafting. There is a sizable hiatal hernia. There is degenerative change in the thoracic spine. IMPRESSION: Sizable hiatal hernia. Scarring left lower lobe. No edema or consolidation. Heart is borderline enlarged. There is aortic atherosclerosis. Aortic Atherosclerosis (ICD10-I70.0). Electronically Signed   By: Lowella Grip III M.D.   On: 07/01/2017 18:23      Management plans discussed with the patient, family and they are in agreement.  CODE STATUS:  Code Status History    Date  Active Date Inactive Code Status Order ID Comments User Context   07/01/2017 10:29 PM 07/02/2017  8:42 PM Full Code [id number]  Lance Coon, MD Inpatient      TOTAL TIME TAKING CARE OF THIS PATIENT: 40 minutes.    Henreitta Leber M.D on 07/03/2017 at 3:19 PM  Between 7am to 6pm - Pager - [phone]  After 6pm go to [email or web address] - Proofreader  [facility] [place] [facility]  Office  [phone]  CC: Primary care physician; Patient, No Pcp Per

## 2017-07-07 ENCOUNTER — Encounter (INDEPENDENT_AMBULATORY_CARE_PROVIDER_SITE_OTHER): Payer: Self-pay

## 2017-07-07 ENCOUNTER — Encounter: Payer: Self-pay | Admitting: Family Medicine

## 2017-07-07 ENCOUNTER — Ambulatory Visit (INDEPENDENT_AMBULATORY_CARE_PROVIDER_SITE_OTHER): Payer: Medicare HMO | Admitting: Family Medicine

## 2017-07-07 VITALS — BP 140/60 | HR 64 | Temp 97.6°F | Ht 66.0 in | Wt 190.4 lb

## 2017-07-07 DIAGNOSIS — E119 Type 2 diabetes mellitus without complications: Secondary | ICD-10-CM

## 2017-07-07 DIAGNOSIS — I4892 Unspecified atrial flutter: Secondary | ICD-10-CM

## 2017-07-07 DIAGNOSIS — R413 Other amnesia: Secondary | ICD-10-CM | POA: Diagnosis not present

## 2017-07-07 DIAGNOSIS — I10 Essential (primary) hypertension: Secondary | ICD-10-CM | POA: Diagnosis not present

## 2017-07-07 DIAGNOSIS — H5702 Anisocoria: Secondary | ICD-10-CM

## 2017-07-07 LAB — COMPREHENSIVE METABOLIC PANEL
ALT: 10 U/L (ref 0–53)
AST: 15 U/L (ref 0–37)
Albumin: 3.4 g/dL — ABNORMAL LOW (ref 3.5–5.2)
Alkaline Phosphatase: 86 U/L (ref 39–117)
BUN: 23 mg/dL (ref 6–23)
CALCIUM: 9.1 mg/dL (ref 8.4–10.5)
CHLORIDE: 105 meq/L (ref 96–112)
CO2: 26 meq/L (ref 19–32)
CREATININE: 1.48 mg/dL (ref 0.40–1.50)
GFR: 47.61 mL/min — ABNORMAL LOW (ref >60.00)
Glucose, Bld: 260 mg/dL — ABNORMAL HIGH (ref 70–99)
Potassium: 4.8 mEq/L (ref 3.5–5.1)
SODIUM: 137 meq/L (ref 135–145)
Total Bilirubin: 0.6 mg/dL (ref 0.2–1.2)
Total Protein: 7 g/dL (ref 6.0–8.3)

## 2017-07-07 LAB — HEMOGLOBIN A1C: HEMOGLOBIN A1C: 6.2 % (ref 4.6–6.5)

## 2017-07-07 NOTE — Assessment & Plan Note (Signed)
At goal for age. Continue to monitor and continue current medications.

## 2017-07-07 NOTE — Assessment & Plan Note (Signed)
Patient with history of diabetes. Had been getting hypoglycemic and they discontinued his sulfonylurea while he was in the hospital. He has not had any hypoglycemia since then. Discussed continuing his metformin for now and checking an A1c and other lab work today. We'll recheck him in a month.

## 2017-07-07 NOTE — Patient Instructions (Signed)
Nice to see you. Please continue the metformin for your diabetes. We'll see you back in about a month for follow-up on this. Please see your eye doctor.

## 2017-07-07 NOTE — Progress Notes (Signed)
[name], MD Phone: [phone]  Devin Reese is a 83 y.o. male who presents today for new patient visit.  Hypertension/A flutter/CAD: Currently on Eliquis. Recently hospitalized and stress test. No she's bleeding. No recurrent chest pain. No palpitations.  Diabetes: Was having issues with hypoglycemia down to 25 with his blood glucose. They discontinued his sulfonylurea in the hospital and is currently only on metformin. Sugars have been running between 113-173.  Notes he had some depression with the loss of his sister and he wasn't eating all that well. Notes more recently he has been eating better and has not felt depressed. It appears there is some concern for dementia though his wife thinks this may be related to his hearing. He occasionally does have trouble remembering things. He bathes himself and feeds himself. He dresses himself. His wife is always manage the finances and cooks. He is driving with no reported difficulty.  Patient reports a history of stroke in the past while having a heart catheterization. It affected the right visual field in his right eye. He notes no vision changes. No new neurological issues.  Active Ambulatory Problems    Diagnosis Date Noted  . OLD MYOCARDIAL INFARCTION 02/20/2010  . CAD, ARTERY BYPASS GRAFT 09/14/2009  . Coronary artery disease   . Hypertension   . Hyperlipidemia   . Atrial flutter ([place]) 02/25/2013  . Abnormal CXR 04/16/2013  . Chest pain 07/01/2017  . Diabetes ([place]) 07/01/2017  . HTN (hypertension) 07/01/2017  . Hypoglycemia 07/02/2017  . Elevated troponin 07/02/2017  . CKD (chronic kidney disease), stage II 07/02/2017  . Anisocoria 07/07/2017  . Memory difficulty 07/07/2017   Resolved Ambulatory Problems    Diagnosis Date Noted  . AKI (acute kidney injury) ([place]) 07/01/2017  . Atrial flutter ([place]) 07/02/2017   Past Medical History:  Diagnosis Date  . Atrial flutter ([place])   . CAD (coronary artery disease)   .  CKD (chronic kidney disease), stage II   . COPD (chronic obstructive pulmonary disease) ([place])   . Coronary artery disease   . Diabetes mellitus without complication ([place])   . Hyperlipidemia   . Hypertension   . Prostate cancer ([place])   . Stroke [facility])     Family History  Problem Relation Age of Onset  . Heart disease Father   . Coronary artery disease Father   . Cancer Unknown        family hx  . Coronary artery disease Unknown        family hx     Social History   Social History  . Marital status: Married    Spouse name: N/A  . Number of children: N/A  . Years of education: N/A   Occupational History  . Not on file.   Social History Main Topics  . Smoking status: Former Smoker    Packs/day: 1.00    Years: 25.00    Types: Cigarettes  . Smokeless tobacco: Never Used  . Alcohol use No     Comment: Rare  . Drug use: No  . Sexual activity: Not on file   Other Topics Concern  . Not on file   Social History Narrative   ** Merged History Encounter **       Retired, married, does not get regular exercise.     ROS  General:  Negative for nexplained weight loss, fever Skin: Negative for new or changing mole, sore that won't heal HEENT: Negative for trouble hearing, trouble seeing, ringing in ears, mouth  sores, hoarseness, change in voice, dysphagia. CV:  Negative for chest pain, dyspnea, edema, palpitations Resp: Negative for cough, dyspnea, hemoptysis GI: Negative for nausea, vomiting, diarrhea, constipation, abdominal pain, melena, hematochezia. GU: Negative for dysuria, incontinence, urinary hesitance, hematuria, vaginal or penile discharge, polyuria, sexual difficulty, lumps in testicle or breasts MSK: Negative for muscle cramps or aches, joint pain or swelling Neuro: Negative for headaches, weakness, numbness, dizziness, passing out/fainting Psych: Negative for depression, anxiety, positive for memory problems  Objective  Physical Exam Vitals:   07/07/17  0936  BP: 140/60  Pulse: 64  Temp: 97.6 F (36.4 C)  SpO2: 96%    BP Readings from Last 3 Encounters:  07/07/17 140/60  07/02/17 (!) 152/62  02/04/17 120/60   Wt Readings from Last 3 Encounters:  07/07/17 190 lb 6.4 oz (86.4 kg)  07/01/17 194 lb 11.2 oz (88.3 kg)  02/04/17 198 lb (89.8 kg)    Physical Exam  Constitutional: No distress.  HENT:  Head: Normocephalic and atraumatic.  Mouth/Throat: Oropharynx is clear and moist. No oropharyngeal exudate.  Eyes: Conjunctivae are normal.  Cardiovascular: Normal heart sounds.   On exam patient with a number of regular beats in a row and then what sounded to be an extra beat consistent with likely PAC or PVC, no irregularity to the rhythm  Pulmonary/Chest: Effort normal and breath sounds normal.  Abdominal: Soft. Bowel sounds are normal. He exhibits no distension. There is no tenderness. There is no rebound and no guarding.  Neurological: He is alert.  Right pupil slightly smaller than left pupil slightly more noticeable in dark compared to light, both pupils reactive to light, fundus exam attempted though difficult given pupillary constriction, right visual field of right eye absent, diminished hearing bilaterally that is chronic and stable, otherwise CN 2-12 intact, 5/5 strength in bilateral biceps, triceps, grip, quads, hamstrings, plantar and dorsiflexion, sensation to light touch intact in bilateral UE and LE, normal gait, absent patellar reflexes bilaterally  Skin: Skin is warm and dry. He is not diaphoretic.  Psychiatric: Mood and affect normal.   patient with 1 out of 3 word recall on mini cog and failed clock test  Assessment/Plan:   Hypertension At goal for age. Continue to monitor and continue current medications.  Atrial flutter Asymptomatic. Patient with likely PACs on exam particularly given known history of PACs. No irregularity noted. No tachycardia. He is on Eliquis. Continue to monitor.  Diabetes [facility]) Patient  with history of diabetes. Had been getting hypoglycemic and they discontinued his sulfonylurea while he was in the hospital. He has not had any hypoglycemia since then. Discussed continuing his metformin for now and checking an A1c and other lab work today. We'll recheck him in a month.  Anisocoria Noted on exam today. It does not appear to have been noted previously on review of the record. He has had a stroke that affected his right visual field in his right eye. Potentially this could be related to this and it wasn't picked up on previously. Suspect the small pupil is the abnormal one based on exam in light and dark. He has no noted ptosis. Potentially could be physiologic anisocoria. He has had no new neurological symptoms. Given isolated anisocoria I believe he should see his eye doctor for an exam to help determine the cause. His wife notes she will be able to get him into see them fairly quickly.  Memory difficulty Possibly some memory difficulty. His difficulty hearing could also contribute to this. Failed the  mini cog test today. We'll plan on doing more extensive memory testing at his next visit.   Orders Placed This Encounter  Procedures  . HgB A1c  . Comp Met (CMET)    No orders of the defined types were placed in this encounter.    [name], MD [place]

## 2017-07-07 NOTE — Assessment & Plan Note (Signed)
Noted on exam today. It does not appear to have been noted previously on review of the record. He has had a stroke that affected his right visual field in his right eye. Potentially this could be related to this and it wasn't picked up on previously. Suspect the small pupil is the abnormal one based on exam in light and dark. He has no noted ptosis. Potentially could be physiologic anisocoria. He has had no new neurological symptoms. Given isolated anisocoria I believe he should see his eye doctor for an exam to help determine the cause. His wife notes she will be able to get him into see them fairly quickly.

## 2017-07-07 NOTE — Assessment & Plan Note (Signed)
Possibly some memory difficulty. His difficulty hearing could also contribute to this. Failed the mini cog test today. We'll plan on doing more extensive memory testing at his next visit.

## 2017-07-07 NOTE — Assessment & Plan Note (Signed)
Asymptomatic. Patient with likely PACs on exam particularly given known history of PACs. No irregularity noted. No tachycardia. He is on Eliquis. Continue to monitor.

## 2017-07-08 LAB — NM MYOCAR MULTI W/SPECT W/WALL MOTION / EF
CHL CUP RESTING HR STRESS: 54 {beats}/min
CHL CUP STRESS STAGE 2 GRADE: 0 %
CHL CUP STRESS STAGE 2 HR: 59 {beats}/min
CHL CUP STRESS STAGE 3 HR: 61 {beats}/min
CHL CUP STRESS STAGE 4 SPEED: 0 mph
CHL CUP STRESS STAGE 5 HR: 74 {beats}/min
CHL CUP STRESS STAGE 5 SBP: 131 mmHg
CSEPEW: 1 METS
CSEPHR: 62 %
LV dias vol: 96 mL (ref 62–150)
LVSYSVOL: 49 mL
NUC STRESS TID: 0.83
Peak HR: 61 {beats}/min
Percent of predicted max HR: 46 %
SDS: 0
SRS: 17
SSS: 19
Stage 1 Grade: 0 %
Stage 1 HR: 59 {beats}/min
Stage 1 Speed: 0 mph
Stage 2 Speed: 0 mph
Stage 3 Grade: 0 %
Stage 3 Speed: 0 mph
Stage 4 Grade: 0 %
Stage 4 HR: 83 {beats}/min
Stage 5 DBP: 62 mmHg
Stage 5 Grade: 0 %
Stage 5 Speed: 0 mph

## 2017-07-12 ENCOUNTER — Encounter: Payer: Self-pay | Admitting: Family Medicine

## 2017-07-14 ENCOUNTER — Other Ambulatory Visit: Payer: Self-pay | Admitting: Family Medicine

## 2017-07-14 DIAGNOSIS — N183 Chronic kidney disease, stage 3 unspecified: Secondary | ICD-10-CM

## 2017-07-17 DIAGNOSIS — E119 Type 2 diabetes mellitus without complications: Secondary | ICD-10-CM | POA: Diagnosis not present

## 2017-07-17 LAB — HM DIABETES EYE EXAM

## 2017-07-25 ENCOUNTER — Encounter: Payer: Self-pay | Admitting: Family Medicine

## 2017-08-08 ENCOUNTER — Ambulatory Visit (INDEPENDENT_AMBULATORY_CARE_PROVIDER_SITE_OTHER): Payer: Medicare HMO | Admitting: Cardiovascular Disease

## 2017-08-08 ENCOUNTER — Encounter: Payer: Self-pay | Admitting: Cardiovascular Disease

## 2017-08-08 VITALS — BP 128/68 | HR 70 | Ht 66.0 in | Wt 188.5 lb

## 2017-08-08 DIAGNOSIS — I25118 Atherosclerotic heart disease of native coronary artery with other forms of angina pectoris: Secondary | ICD-10-CM | POA: Diagnosis not present

## 2017-08-08 DIAGNOSIS — I10 Essential (primary) hypertension: Secondary | ICD-10-CM | POA: Diagnosis not present

## 2017-08-08 DIAGNOSIS — I4892 Unspecified atrial flutter: Secondary | ICD-10-CM | POA: Diagnosis not present

## 2017-08-08 DIAGNOSIS — E785 Hyperlipidemia, unspecified: Secondary | ICD-10-CM

## 2017-08-08 NOTE — Progress Notes (Signed)
Cardiology Office Note   Date:  08/08/2017   ID:  Devin Reese, DOB 1927-02-17, MRN [id number]  PCP:  Leone Haven, MD  Cardiologist:   Kathlyn Sacramento, MD   Chief Complaint  Patient presents with  . other    6 month follow up. Meds reviewed by the pt. verbally.       History of Present Illness: Devin Reese is a 83 y.o. male who presents for a followup visit regarding coronary artery disease and atrial flutter. He has known history of coronary artery disease status post CABG in 1991 at [facility]. He was diagnosed with atrial flutter in 2014 with subsequent successful cardioversion. No recurrent arrhythmia since then.  Most recent echocardiogram in 2015  showed low-normal LV systolic function with an ejection fraction of 50-55% with mild mitral regurgitation.   A pharmacologic nuclear stress after cardioversion in May of 2014 showed evidence of previous inferior scar with no significant ischemia.  Carvedilol was discontinued recently due to bradycardia. He has been doing well overall with very mild chest pain with over exertion but not with regular activities. He has been having issues with hypoglycemia. He also had chest pain and was hospitalized in August with minimally elevated troponin. Echocardiogram showed stable ejection fraction of 45% with hypokinesis of the inferior and inferolateral wall. There was mild mitral and aortic regurgitation. He underwent a pharmacologic nuclear stress test which showed no significant ischemia (report is not completed).   Past Medical History:  Diagnosis Date  . Atrial flutter ([place])   . CAD (coronary artery disease)   . CKD (chronic kidney disease), stage II   . COPD (chronic obstructive pulmonary disease) ([place])   . Coronary artery disease    CABG in 1991 at [facility]. Most recent cardiac catheterization in 2800 was complicated by stroke.  . Diabetes mellitus without complication ([place])   . Hyperlipidemia   . Hypertension   .  Prostate cancer ([place])   . Stroke [facility])     Past Surgical History:  Procedure Laterality Date  . CARDIAC CATHETERIZATION    . CARDIAC SURGERY    . CARDIOVERSION  03/29/13  . CORONARY ARTERY BYPASS GRAFT       Current Outpatient Prescriptions  Medication Sig Dispense Refill  . albuterol (PROVENTIL HFA;VENTOLIN HFA) 108 (90 Base) MCG/ACT inhaler Inhale 1-2 puffs into the lungs every 4 (four) hours as needed for wheezing or shortness of breath.    Marland Kitchen apixaban (ELIQUIS) 2.5 MG TABS tablet Take 1 tablet (2.5 mg total) by mouth 2 (two) times daily. 60 tablet 3  . atorvastatin (LIPITOR) 40 MG tablet Take by mouth daily.     Marland Kitchen glyBURIDE-metformin (GLUCOVANCE) 2.5-500 MG per tablet Take 1 tablet by mouth 2 (two) times daily.      Marland Kitchen levothyroxine (SYNTHROID, LEVOTHROID) 25 MCG tablet Take 25 mcg by mouth daily before breakfast.    . lisinopril (PRINIVIL,ZESTRIL) 5 MG tablet Take 5 mg by mouth daily.    . metFORMIN (GLUCOPHAGE) 500 MG tablet Take 1 tablet (500 mg total) by mouth 2 (two) times daily with a meal.    . nitroGLYCERIN (NITROSTAT) 0.4 MG SL tablet Place 1 tablet (0.4 mg total) under the tongue every 5 (five) minutes as needed for chest pain. 25 tablet 3  . sertraline (ZOLOFT) 50 MG tablet Take 50 mg by mouth daily.     Marland Kitchen umeclidinium bromide (INCRUSE ELLIPTA) 62.5 MCG/INH AEPB Inhale 1 puff into the lungs daily.  No current facility-administered medications for this visit.     Allergies:   Patient has no known allergies.    Social History:  The patient  reports that he has quit smoking. His smoking use included Cigarettes. He has a 25.00 pack-year smoking history. He has never used smokeless tobacco. He reports that he does not drink alcohol or use drugs.   Family History:  The patient's family history includes Cancer in his unknown relative; Coronary artery disease in his father and unknown relative; Heart disease in his father.    ROS:  Please see the history of present  illness.   Otherwise, review of systems are positive for none.   All other systems are reviewed and negative.    PHYSICAL EXAM: VS:  BP 128/68 (BP Location: Left Arm, Patient Position: Sitting, Cuff Size: Normal)   Pulse 70   Ht 5\' 6"  (1.676 m)   Wt 188 lb 8 oz (85.5 kg)   BMI 30.42 kg/m  , BMI Body mass index is 30.42 kg/m. GEN: Well nourished, well developed, in no acute distress  HEENT: normal  Neck: no JVD, carotid bruits, or masses Cardiac: RRR ; no murmurs, rubs, or gallops,no edema  Respiratory:  clear to auscultation bilaterally, normal work of breathing GI: soft, nontender, nondistended, + BS MS: no deformity or atrophy  Skin: warm and dry, no rash Neuro:  Strength and sensation are intact Psych: euthymic mood, full affect   EKG:  EKG is ordered today. The ekg ordered today demonstrates normal sinus rhythm with PACs. Possible old inferior infarct.  Recent Labs: 07/02/2017: Hemoglobin 11.8; Platelets 186 07/07/2017: ALT 10; BUN 23; Creatinine, Ser 1.48; Potassium 4.8; Sodium 137    Lipid Panel    Component Value Date/Time   CHOL 143 03/20/2017 0826   TRIG 95 03/20/2017 0826   HDL 40 03/20/2017 0826   CHOLHDL 3.6 03/20/2017 0826   LDLCALC 84 03/20/2017 0826      Wt Readings from Last 3 Encounters:  08/08/17 188 lb 8 oz (85.5 kg)  07/07/17 190 lb 6.4 oz (86.4 kg)  07/01/17 194 lb 11.2 oz (88.3 kg)         ASSESSMENT AND PLAN:  1.  Paroxysmal atrial flutter: He continues to be in sinus rhythm. Continue long-term anticoagulation with Eliquis. His creatinine has been fluctuating around 1.5 and have elected to keep him on the 2.5 mg dose.  2. Coronary artery disease involving native coronary arteries with stable angina:   He has stable anginal symptoms with no recent ischemia on nuclear stress testing. Continue medical therapy.   3. Essential hypertension: Blood pressure is  well controlled on current medications   4. Hyperlipidemia: Continue atorvastatin  40 mg once daily. His LDL improved after switching from pravastatin.   Disposition:   FU with me in 6 months  Signed,  Kathlyn Sacramento, MD  08/08/2017 8:34 AM    [place]

## 2017-08-08 NOTE — Patient Instructions (Signed)
Medication Instructions: Continue same medications.   Labwork: None.   Procedures/Testing: None.   Follow-Up: 6 months with Dr. Zianna Dercole.   Any Additional Special Instructions Will Be Listed Below (If Applicable).     If you need a refill on your cardiac medications before your next appointment, please call your pharmacy.   

## 2017-08-20 ENCOUNTER — Encounter: Payer: Self-pay | Admitting: Family Medicine

## 2017-08-20 ENCOUNTER — Ambulatory Visit (INDEPENDENT_AMBULATORY_CARE_PROVIDER_SITE_OTHER): Payer: Medicare HMO | Admitting: Family Medicine

## 2017-08-20 VITALS — BP 134/78 | HR 63 | Temp 97.6°F | Wt 191.6 lb

## 2017-08-20 DIAGNOSIS — Z23 Encounter for immunization: Secondary | ICD-10-CM

## 2017-08-20 DIAGNOSIS — J449 Chronic obstructive pulmonary disease, unspecified: Secondary | ICD-10-CM | POA: Insufficient documentation

## 2017-08-20 DIAGNOSIS — E039 Hypothyroidism, unspecified: Secondary | ICD-10-CM | POA: Insufficient documentation

## 2017-08-20 DIAGNOSIS — E162 Hypoglycemia, unspecified: Secondary | ICD-10-CM

## 2017-08-20 DIAGNOSIS — R7309 Other abnormal glucose: Secondary | ICD-10-CM

## 2017-08-20 DIAGNOSIS — E119 Type 2 diabetes mellitus without complications: Secondary | ICD-10-CM

## 2017-08-20 DIAGNOSIS — I10 Essential (primary) hypertension: Secondary | ICD-10-CM | POA: Diagnosis not present

## 2017-08-20 MED ORDER — ALBUTEROL SULFATE HFA 108 (90 BASE) MCG/ACT IN AERS
1.0000 | INHALATION_SPRAY | RESPIRATORY_TRACT | 2 refills | Status: DC | PRN
Start: 2017-08-20 — End: 2020-10-24

## 2017-08-20 NOTE — Assessment & Plan Note (Signed)
Most recent A1c well controlled. He continues to have intermittent low blood sugars. Discussed discontinuing metformin. Check glucose today. They will monitor for the next week off of metformin and ensure that he eats 3 adequate meals daily. They will contact us in 1 week to let us know what her sugars have been running. If they are persistently low prior to then they will contact us.

## 2017-08-20 NOTE — Assessment & Plan Note (Signed)
Taking Synthroid. Check TSH. 

## 2017-08-20 NOTE — Patient Instructions (Addendum)
Nice to see you. You need to restart on your daily inhaler. Please call us to let us know the name of what you have at home. If you need a refill please let us know. If your wheezing does not improve with use of this medication please let us know. We will check lab work today and contact you with the results. Please discontinue the metformin. Please monitor your blood glucose at varying times during the day. Please contact us in 1 week to let us know what it is running.

## 2017-08-20 NOTE — Assessment & Plan Note (Signed)
Previously relatively well controlled. Does note some intermittent wheezing since stopping his controller inhaler. They're unsure of the exact name though it appears he is supposed to be on Incruse ellipta daily. Encouraged him to restart on this. They'll contact us with the exact name of what he has at home. Albuterol refilled. Given return precautions.

## 2017-08-20 NOTE — Assessment & Plan Note (Signed)
At goal.  Continue current medications.  Check labs  

## 2017-08-20 NOTE — Progress Notes (Signed)
  [name], MD Phone: [phone]  Devin Reese is a 83 y.o. male who presents today for follow-up.  Hypertension: Not checking blood pressure. Taking lisinopril. No chest pain or shortness of breath.  Diabetes: He is taking metformin. He notes hypoglycemic episodes down into the 40s at times. About 40% of his checks over the last month have been low. He feels tired when this occurs and eat something and feels improved. He had issues with low sugar previously and they discontinued his glyburide. They do not believe he is still taking this. No polyuria or polydipsia.  Patient does note he wheezes quite a bit. He notes no chest pain. No shortness of breath. He saw Dr. Fletcher Anon last week for a checkup. He last saw his pulmonologist 2 months ago. He is supposed to be on a long-acting anticholinergic agent though he reports he has not taken this recently as he thought the doctor told him not to take this. He uses the albuterol inhaler usually 1 or 2 times a day. Cough some at night.  He does take Synthroid. It is been sometime since his last TSH check.  PMH: Former smoker   ROS see history of present illness  Objective  Physical Exam Vitals:   08/20/17 1554  BP: 134/78  Pulse: 63  Temp: 97.6 F (36.4 C)  SpO2: 96%    BP Readings from Last 3 Encounters:  08/20/17 134/78  08/08/17 128/68  07/07/17 140/60   Wt Readings from Last 3 Encounters:  08/20/17 191 lb 9.6 oz (86.9 kg)  08/08/17 188 lb 8 oz (85.5 kg)  07/07/17 190 lb 6.4 oz (86.4 kg)    Physical Exam  Constitutional: No distress.  Cardiovascular: Normal rate, regular rhythm and normal heart sounds.   Pulmonary/Chest: Effort normal. No respiratory distress. He has wheezes (scattered expiratory wheezes). He has no rales.  Musculoskeletal: He exhibits no edema.  Neurological: He is alert. Gait normal.  Skin: He is not diaphoretic.     Assessment/Plan: Please see individual problem list.  Hypertension At  goal. Continue current medications. Check labs.  Diabetes ([place]) Most recent A1c well controlled. He continues to have intermittent low blood sugars. Discussed discontinuing metformin. Check glucose today. They will monitor for the next week off of metformin and ensure that he eats 3 adequate meals daily. They will contact us in 1 week to let us know what her sugars have been running. If they are persistently low prior to then they will contact us.  COPD (chronic obstructive pulmonary disease) ([place]) Previously relatively well controlled. Does note some intermittent wheezing since stopping his controller inhaler. They're unsure of the exact name though it appears he is supposed to be on Incruse ellipta daily. Encouraged him to restart on this. They'll contact us with the exact name of what he has at home. Albuterol refilled. Given return precautions.  Hypothyroidism Taking Synthroid. Check TSH.   Orders Placed This Encounter  Procedures  . Flu vaccine HIGH DOSE PF  . Basic Metabolic Panel (BMET)  . TSH    Meds ordered this encounter  Medications  . albuterol (PROVENTIL HFA;VENTOLIN HFA) 108 (90 Base) MCG/ACT inhaler    Sig: Inhale 1-2 puffs into the lungs every 4 (four) hours as needed for wheezing or shortness of breath.    Dispense:  1 Inhaler    Refill:  [place], MD [place]

## 2017-08-21 LAB — BASIC METABOLIC PANEL
BUN: 24 mg/dL — AB (ref 6–23)
CALCIUM: 9.8 mg/dL (ref 8.4–10.5)
CHLORIDE: 108 meq/L (ref 96–112)
CO2: 24 mEq/L (ref 19–32)
CREATININE: 1.5 mg/dL (ref 0.40–1.50)
GFR: 46.86 mL/min — AB (ref >60.00)
GLUCOSE: 73 mg/dL (ref 70–99)
POTASSIUM: 4.6 meq/L (ref 3.5–5.1)
SODIUM: 140 meq/L (ref 135–145)

## 2017-08-21 LAB — TSH: TSH: 1.19 u[IU]/mL (ref 0.35–4.50)

## 2017-08-26 ENCOUNTER — Other Ambulatory Visit: Payer: Self-pay | Admitting: Family Medicine

## 2017-08-26 MED ORDER — UMECLIDINIUM BROMIDE 62.5 MCG/INH IN AEPB: 1.0000 | INHALATION_SPRAY | Freq: Every day | RESPIRATORY_TRACT | 3 refills | Status: DC

## 2017-09-25 ENCOUNTER — Telehealth: Payer: Self-pay | Admitting: Family Medicine

## 2017-09-25 DIAGNOSIS — I10 Essential (primary) hypertension: Secondary | ICD-10-CM | POA: Diagnosis not present

## 2017-09-25 DIAGNOSIS — R809 Proteinuria, unspecified: Secondary | ICD-10-CM | POA: Diagnosis not present

## 2017-09-25 DIAGNOSIS — E1129 Type 2 diabetes mellitus with other diabetic kidney complication: Secondary | ICD-10-CM | POA: Diagnosis not present

## 2017-09-25 DIAGNOSIS — R600 Localized edema: Secondary | ICD-10-CM | POA: Diagnosis not present

## 2017-09-25 DIAGNOSIS — N183 Chronic kidney disease, stage 3 (moderate): Secondary | ICD-10-CM | POA: Diagnosis not present

## 2017-09-25 NOTE — Telephone Encounter (Signed)
Please advise 

## 2017-09-25 NOTE — Telephone Encounter (Signed)
Copied from [place] [id number]. Topic: Quick Communication - See Telephone Encounter >> Sep 25, 2017 11:47 AM Devin Reese wrote: CRM for notification. See Telephone encounter for:  09/25/17. Pt wants different meds for blood sugar control

## 2017-09-26 NOTE — Telephone Encounter (Signed)
Tried calling, no vm 

## 2017-09-29 ENCOUNTER — Ambulatory Visit: Payer: Self-pay | Admitting: *Deleted

## 2017-09-29 NOTE — Telephone Encounter (Signed)
Patient Metformin was DC due Kidney function and Glimepiride stopped due to low CBG's patient tried to reach Dr. Caryl Bis before he left out office due to Nephrology had advised patient he needed to be on something for his Hyperglycemia , patient fasting CBG this morning 245?

## 2017-09-29 NOTE — Telephone Encounter (Signed)
Patient metformin was stopped due to Kidney decreased but patient remains to elevated CBG readings at 245 fasting Dr. Ellamae Sia Dr. Caryl Bis recommended coming into the office for visit to start new medication. I have no other appointment ok I scheduled with NP.

## 2017-09-29 NOTE — Telephone Encounter (Addendum)
Pt's wife June called regarding husbands blood sugars; per wife Dr Caryl Bis stopped pt's metformin and referred him to kidney MD; Pt did see kidney MD and was told that the pt did need medication for high blood sugars; pt's blood sugar was elevated last week and was 245 this am. Pt's wife originally called on 09/25/17. Do they need to come in for a visit? They can be reached at [phone] has 1030 appointment this morning; Routed to [place]; They will contact patient with further instruction

## 2017-09-29 NOTE — Telephone Encounter (Signed)
I would recommend office visit in complicated elderly patient- can you guys get him in this week at [place]?

## 2017-09-30 ENCOUNTER — Encounter: Payer: Self-pay | Admitting: Family

## 2017-09-30 ENCOUNTER — Ambulatory Visit: Payer: Medicare HMO | Admitting: Family

## 2017-09-30 VITALS — BP 130/60 | HR 75 | Temp 97.8°F | Ht 66.0 in | Wt 186.4 lb

## 2017-09-30 DIAGNOSIS — E119 Type 2 diabetes mellitus without complications: Secondary | ICD-10-CM

## 2017-09-30 DIAGNOSIS — J449 Chronic obstructive pulmonary disease, unspecified: Secondary | ICD-10-CM | POA: Diagnosis not present

## 2017-09-30 NOTE — Telephone Encounter (Signed)
Patient is scheduled with Mable Paris today for blood sugar control

## 2017-09-30 NOTE — Progress Notes (Signed)
Subjective:    Patient ID: Devin Reese, male    DOB: August 15, 1927, 83 y.o.   MRN: [id number]  CC: Devin Reese is a 83 y.o. male who presents today for follow up.   HPI: Elevated blood sugars.   Has seen kidney doctor and advised not to take metformin as 'hard on kidneys. No longer on glimepiride.  FBG yesterday was 200, ranges from 221, 171, 158, 163, 186, 192, 204, 197, 208, 245, and today was 67 at 11am. Had tomato soup last night. Notes usually eats cookies at night however didn't eat cookies last night.   H/o COPD. Has been coughing; its productive however not increased from baseline. No fever, sob, CP. Some wheezing after activity- which he notes is working in the yard.  Using albuterol inhaler once per day. Using ellipta.   H/o atrial flutter- on eliquis      h/o hypoglycemia- at last visit with PCP, glucose 73.   HISTORY:  Past Medical History:  Diagnosis Date  . Atrial flutter ([place])   . CAD (coronary artery disease)   . CKD (chronic kidney disease), stage II   . COPD (chronic obstructive pulmonary disease) ([place])   . Coronary artery disease    CABG in 1991 at [facility]. Most recent cardiac catheterization in 5993 was complicated by stroke.  . Diabetes mellitus without complication (Devin Reese)   . Hyperlipidemia   . Hypertension   . Prostate cancer (Devin Reese)   . Stroke Devin Reese)    Past Surgical History:  Procedure Laterality Date  . CARDIAC CATHETERIZATION    . CARDIAC SURGERY    . CARDIOVERSION  03/29/13  . CORONARY ARTERY BYPASS GRAFT     Family History  Problem Relation Age of Onset  . Heart disease Father   . Coronary artery disease Father   . Cancer Unknown        family hx  . Coronary artery disease Unknown        family hx     Allergies: Patient has no known allergies. Current Outpatient Medications on File Prior to Visit  Medication Sig Dispense Refill  . albuterol (PROVENTIL HFA;VENTOLIN HFA) 108 (90 Base) MCG/ACT inhaler Inhale 1-2 puffs into the  lungs every 4 (four) hours as needed for wheezing or shortness of breath. 1 Inhaler 2  . apixaban (ELIQUIS) 2.5 MG TABS tablet Take 1 tablet (2.5 mg total) by mouth 2 (two) times daily. 60 tablet 3  . atorvastatin (LIPITOR) 40 MG tablet Take by mouth daily.     Marland Kitchen levothyroxine (SYNTHROID, LEVOTHROID) 25 MCG tablet Take 25 mcg by mouth daily before breakfast.    . lisinopril (PRINIVIL,ZESTRIL) 5 MG tablet Take 5 mg by mouth daily.    . nitroGLYCERIN (NITROSTAT) 0.4 MG SL tablet Place 1 tablet (0.4 mg total) under the tongue every 5 (five) minutes as needed for chest pain. 25 tablet 3  . sertraline (ZOLOFT) 50 MG tablet Take 50 mg by mouth daily.     Marland Kitchen umeclidinium bromide (INCRUSE ELLIPTA) 62.5 MCG/INH AEPB Inhale 1 puff into the lungs daily. 30 each 3   No current facility-administered medications on file prior to visit.     Social History   Tobacco Use  . Smoking status: Former Smoker    Packs/day: 1.00    Years: 25.00    Pack years: 25.00    Types: Cigarettes  . Smokeless tobacco: Never Used  Substance Use Topics  . Alcohol use: No    Comment: Rare  .  Drug use: No    Review of Systems  Constitutional: Negative for chills and fever.  HENT: Positive for congestion. Negative for ear pain, sinus pressure and sore throat.   Respiratory: Positive for cough and wheezing. Negative for shortness of breath.   Cardiovascular: Negative for chest pain and palpitations.  Gastrointestinal: Negative for diarrhea, nausea and vomiting.  Neurological: Negative for headaches.      Objective:    BP 130/60   Pulse 75   Temp 97.8 F (36.6 C) (Oral)   Ht 5\' 6"  (1.676 m)   Wt 186 lb 6.4 oz (84.6 kg)   SpO2 96%   BMI 30.09 kg/m  BP Readings from Last 3 Encounters:  09/30/17 130/60  08/20/17 134/78  08/08/17 128/68   Wt Readings from Last 3 Encounters:  09/30/17 186 lb 6.4 oz (84.6 kg)  08/20/17 191 lb 9.6 oz (86.9 kg)  08/08/17 188 lb 8 oz (85.5 kg)    Physical Exam    Constitutional: He appears well-developed and well-nourished.  Cardiovascular: Regular rhythm and normal heart sounds.  Pulmonary/Chest: Effort normal. No respiratory distress. He has wheezes in the right lower field and the left lower field. He has no rhonchi. He has no rales.  Neurological: He is alert.  Skin: Skin is warm and dry.  Psychiatric: He has a normal mood and affect. His speech is normal and behavior is normal.  Vitals reviewed. Patient felt significantly better after albuterol treatment. Lung sounds increased      Assessment & Plan:   Problem List Items Addressed This Visit      Respiratory   COPD (chronic obstructive pulmonary disease) ([place])    sa02 96. Patient is very well-appearing. Not labored in speech. Pleased with nebulizer response and he felt better afterwards. Offered prednisone today, patient politely declines; states that he would try his albuterol inhaler more often and use Mucinex. If no improvement in productive cough, patient will let our office know.        Endocrine   Diabetes ([place]) - Primary    In reviewing chart and speaking with patient and wife at length, very concerned about recent hypoglycemic episodes. H/o Ckd.  Even this morning his blood sugar was 67. I advised patient that I would focus more on diet at this time as well as completing his blood sugar log to further understand what is going on. He will notify us with any further low blood sugars. Referral to dietitian is placed today. Advised patient to follow up with Lavenia Atlas, pharmacist. He'll make this appointment at checkout. Pending A1c      Relevant Orders   Referral to Nutrition and Diabetes Services   POCT HgB A1C       I am having Abbey Chatters. Osmanovic "Trilby Drummer" maintain his sertraline, nitroGLYCERIN, apixaban, atorvastatin, levothyroxine, lisinopril, albuterol, and umeclidinium bromide.   No orders of the defined types were placed in this encounter.   Return precautions given.    Risks, benefits, and alternatives of the medications and treatment plan prescribed today were discussed, and patient expressed understanding.   Education regarding symptom management and diagnosis given to patient on AVS.  Continue to follow with Leone Haven, MD for routine health maintenance.   [name] and I agreed with plan.   Mable Paris, FNP

## 2017-09-30 NOTE — Progress Notes (Signed)
Pre visit review using our clinic review tool, if applicable. No additional management support is needed unless otherwise documented below in the visit note. 

## 2017-09-30 NOTE — Assessment & Plan Note (Signed)
In reviewing chart and speaking with patient and wife at length, very concerned about recent hypoglycemic episodes. H/o Ckd.  Even this morning his blood sugar was 67. I advised patient that I would focus more on diet at this time as well as completing his blood sugar log to further understand what is going on. He will notify us with any further low blood sugars. Referral to dietitian is placed today. Advised patient to follow up with Lavenia Atlas, pharmacist. He'll make this appointment at checkout. Pending A1c

## 2017-09-30 NOTE — Assessment & Plan Note (Signed)
sa02 96. Patient is very well-appearing. Not labored in speech. Pleased with nebulizer response and he felt better afterwards. Offered prednisone today, patient politely declines; states that he would try his albuterol inhaler more often and use Mucinex. If no improvement in productive cough, patient will let our office know.

## 2017-09-30 NOTE — Patient Instructions (Signed)
As discussed, I have grave concern regarding history of low blood sugars. At this time, we jointly agreed with focus on diet, we may be a kitchen in an appropriate range without medication therapy. Referral to nutritionist  Please call the office and make follow up appointment with our pharmacist, Deirdre Pippins. She is specialized in diabetes and she can help with log of blood sugar.   Mucinex with plenty of water  PLEASE Use albuterol every 6 hours for first 24 hours to get good medication into the lungs and loosen congestion; after, you may use as needed and eventually stop all together when cough resolves.  If  No improvement with cough, please let us know immediately as you may need antibiotics, or steroids.   If there is no improvement in your symptoms, or if there is any worsening of symptoms, or if you have any additional concerns, please return for re-evaluation; or, if we are closed, consider going to the Emergency Room for evaluation if symptoms urgent.

## 2017-10-07 ENCOUNTER — Other Ambulatory Visit: Payer: Self-pay | Admitting: Cardiovascular Disease

## 2017-10-20 DIAGNOSIS — K219 Gastro-esophageal reflux disease without esophagitis: Secondary | ICD-10-CM | POA: Diagnosis not present

## 2017-10-20 DIAGNOSIS — R0609 Other forms of dyspnea: Secondary | ICD-10-CM | POA: Diagnosis not present

## 2017-10-20 DIAGNOSIS — J449 Chronic obstructive pulmonary disease, unspecified: Secondary | ICD-10-CM | POA: Diagnosis not present

## 2017-11-20 ENCOUNTER — Encounter: Payer: Self-pay | Admitting: *Deleted

## 2017-11-20 ENCOUNTER — Encounter: Payer: Medicare HMO | Attending: Family Medicine | Admitting: *Deleted

## 2017-11-20 VITALS — BP 122/64 | Ht 66.0 in | Wt 180.4 lb

## 2017-11-20 DIAGNOSIS — E119 Type 2 diabetes mellitus without complications: Secondary | ICD-10-CM | POA: Insufficient documentation

## 2017-11-20 DIAGNOSIS — E1122 Type 2 diabetes mellitus with diabetic chronic kidney disease: Secondary | ICD-10-CM

## 2017-11-20 DIAGNOSIS — Z713 Dietary counseling and surveillance: Secondary | ICD-10-CM | POA: Insufficient documentation

## 2017-11-20 NOTE — Progress Notes (Signed)
Diabetes Self-Management Education  Visit Type: First/Initial  Appt. Start Time: 1325 Appt. End Time: 1430  11/20/2017  [name], identified by name and date of birth, is a 84 y.o. male with a diagnosis of Diabetes: Type 2.   ASSESSMENT  Blood pressure 122/64, height 5\' 6"  (1.676 m), weight 180 lb 6.4 oz (81.8 kg). Body mass index is 29.12 kg/m.  Diabetes Self-Management Education - 11/20/17 1446      Visit Information   Visit Type  First/Initial      Initial Visit   Diabetes Type  Type 2    Are you currently following a meal plan?  No    Are you taking your medications as prescribed?  Yes    Date Diagnosed  > 5 years ago      Health Coping   How would you rate your overall health?  Fair      Psychosocial Assessment   Patient Belief/Attitude about Diabetes  Motivated to manage diabetes    Self-care barriers  None    Self-management support  Doctor's office;Family    Other persons present  Spouse/SO    Patient Concerns  Nutrition/Meal planning;Glycemic Control;Weight Control;Monitoring    Special Needs  None    Preferred Learning Style  Hands on;Visual    Learning Readiness  Ready    How often do you need to have someone help you when you read instructions, pamphlets, or other written materials from your doctor or pharmacy?  1 - Never    What is the last grade level you completed in school?  12th      Pre-Education Assessment   Patient understands the diabetes disease and treatment process.  Needs Instruction    Patient understands incorporating nutritional management into lifestyle.  Needs Instruction    Patient undertands incorporating physical activity into lifestyle.  Needs Instruction    Patient understands using medications safely.  Needs Instruction    Patient understands monitoring blood glucose, interpreting and using results  Needs Review    Patient understands prevention, detection, and treatment of acute complications.  Needs Instruction    Patient  understands prevention, detection, and treatment of chronic complications.  Needs Instruction    Patient understands how to develop strategies to address psychosocial issues.  Needs Instruction    Patient understands how to develop strategies to promote health/change behavior.  Needs Instruction      Complications   Last HgB A1C per patient/outside source  6.2 % 07/07/17 - recent one on 11/13 with no results via chart    How often do you check your blood sugar?  1-2 times/day    Fasting Blood glucose range (mg/dL)  70-129;130-179;180-200;>200 FBG's range from 119-257 mg/dL (higher reading after eating fruit at bedtime)    Postprandial Blood glucose range (mg/dL)  130-179 pp's 130-154 mg/dL    Number of hypoglycemic episodes per month  -- Pt was hospitalized in August 2018 for hypoglycemia. Per Nov note at MD office - BG was in the 60's mg/dL. He was taken off Glimepiride for hypoglycemia and off Metformin due to kidneys.     Have you had a dilated eye exam in the past 12 months?  Yes    Have you had a dental exam in the past 12 months?  No dentures    Are you checking your feet?  No      Dietary Intake   Breakfast  toast, pancakes, sausage and egg sandwich    Lunch  skips  Snack (afternoon)  cookies, fruit    Dinner  fish, beef, pork, chicken with green beans, corn, potatoes, pinto beans, occasional pasta and salad    Snack (evening)  fruit - multiple servings    Beverage(s)  water, coffee, sweet tea, fruit juice      Exercise   Exercise Type  ADL's      Patient Education   Previous Diabetes Education  No    Disease state   Explored patient's options for treatment of their diabetes    Nutrition management   Role of diet in the treatment of diabetes and the relationship between the three main macronutrients and blood glucose level;Reviewed blood glucose goals for pre and post meals and how to evaluate the patients' food intake on their blood glucose level.    Physical activity and  exercise   Role of exercise on diabetes management, blood pressure control and cardiac health.    Monitoring  Taught/evaluated SMBG meter.;Purpose and frequency of SMBG.;Taught/discussed recording of test results and interpretation of SMBG.;Identified appropriate SMBG and/or A1C goals.    Acute complications  Taught treatment of hypoglycemia - the 15 rule.    Chronic complications  Relationship between chronic complications and blood glucose control    Psychosocial adjustment  Identified and addressed patients feelings and concerns about diabetes      Individualized Goals (developed by patient)   Reducing Risk Improve blood sugars Prevent diabetes complications Lose weight     Outcomes   Expected Outcomes  Demonstrated interest in learning. Expect positive outcomes    Future DMSE  PRN    Program Status  Not Completed       Individualized Plan for Diabetes Self-Management Training:   Learning Objective:  Patient will have a greater understanding of diabetes self-management. Patient education plan is to attend individual and/or group sessions per assessed needs and concerns.   Plan:   Patient Instructions  Check blood sugars 2 x day before breakfast and 2 hrs after supper every day Eat 3 meals day,  1-2  snacks a day Space meals 4-6 hours apart Don't skip meals  Avoid sugar sweetened drinks (tea, juices) unless treating a low blood sugar Limit desserts/sweets Avoid eating fruit at bedtrime Carry fast acting glucose and a snack at all times Call back if you want to schedule an appointment with the nurse or dietitian.   Expected Outcomes:  Demonstrated interest in learning. Expect positive outcomes  Education material provided:  General Meal Planning Guidelines Simple Meal Plan Glucose tablets Symptoms, causes and treatments of Hypoglycemia  If problems or questions, patient to contact team via:  Devin Reese, [place], [place], CDE [phone]  Future DSME appointment: PRN   Patient wants to work on changes and call back if he needs to follow up with nurse or dietitian.

## 2017-11-20 NOTE — Patient Instructions (Addendum)
Check blood sugars 2 x day before breakfast and 2 hrs after supper every day  Eat 3 meals day,  1-2  snacks a day Space meals 4-6 hours apart Don't skip meals  Avoid sugar sweetened drinks (tea, juices) unless treating a low blood sugar Limit desserts/sweets Avoid eating fruit at bedtrime  Carry fast acting glucose and a snack at all times  Call back if you want to schedule an appointment with the nurse or dietitian.

## 2017-11-25 DIAGNOSIS — N183 Chronic kidney disease, stage 3 (moderate): Secondary | ICD-10-CM | POA: Diagnosis not present

## 2017-12-02 ENCOUNTER — Encounter: Payer: Self-pay | Admitting: Family Medicine

## 2017-12-02 ENCOUNTER — Other Ambulatory Visit: Payer: Self-pay

## 2017-12-02 ENCOUNTER — Ambulatory Visit (INDEPENDENT_AMBULATORY_CARE_PROVIDER_SITE_OTHER): Payer: Medicare HMO | Admitting: Family Medicine

## 2017-12-02 VITALS — BP 116/64 | HR 58 | Temp 97.4°F | Wt 180.6 lb

## 2017-12-02 DIAGNOSIS — H5702 Anisocoria: Secondary | ICD-10-CM

## 2017-12-02 DIAGNOSIS — E785 Hyperlipidemia, unspecified: Secondary | ICD-10-CM

## 2017-12-02 DIAGNOSIS — I4892 Unspecified atrial flutter: Secondary | ICD-10-CM | POA: Diagnosis not present

## 2017-12-02 DIAGNOSIS — L570 Actinic keratosis: Secondary | ICD-10-CM

## 2017-12-02 DIAGNOSIS — E119 Type 2 diabetes mellitus without complications: Secondary | ICD-10-CM | POA: Diagnosis not present

## 2017-12-02 DIAGNOSIS — Z5181 Encounter for therapeutic drug level monitoring: Secondary | ICD-10-CM | POA: Diagnosis not present

## 2017-12-02 DIAGNOSIS — I10 Essential (primary) hypertension: Secondary | ICD-10-CM | POA: Diagnosis not present

## 2017-12-02 LAB — CBC
HCT: 37 % — ABNORMAL LOW (ref 39.0–52.0)
HEMOGLOBIN: 12.2 g/dL — AB (ref 13.0–17.0)
MCHC: 33.1 g/dL (ref 30.0–36.0)
MCV: 93.5 fl (ref 78.0–100.0)
PLATELETS: 197 10*3/uL (ref 150.0–400.0)
RBC: 3.96 Mil/uL — ABNORMAL LOW (ref 4.22–5.81)
RDW: 13.4 % (ref 11.5–15.5)
WBC: 5.2 10*3/uL (ref 4.0–10.5)

## 2017-12-02 LAB — BASIC METABOLIC PANEL
BUN: 30 mg/dL — AB (ref 6–23)
CHLORIDE: 103 meq/L (ref 96–112)
CO2: 26 meq/L (ref 19–32)
CREATININE: 1.43 mg/dL (ref 0.40–1.50)
Calcium: 9.3 mg/dL (ref 8.4–10.5)
GFR: 49.49 mL/min — ABNORMAL LOW (ref >60.00)
Glucose, Bld: 307 mg/dL — ABNORMAL HIGH (ref 70–99)
POTASSIUM: 4.6 meq/L (ref 3.5–5.1)
Sodium: 137 mEq/L (ref 135–145)

## 2017-12-02 LAB — HEMOGLOBIN A1C: HEMOGLOBIN A1C: 9.6 % — AB (ref 4.6–6.5)

## 2017-12-02 NOTE — Assessment & Plan Note (Signed)
-  Continue Lipitor °

## 2017-12-02 NOTE — Assessment & Plan Note (Signed)
Refer to dermatology for evaluation. 

## 2017-12-02 NOTE — Assessment & Plan Note (Signed)
Controlled.   -Continue current medication

## 2017-12-02 NOTE — Progress Notes (Signed)
  Devin Rumps, MD Phone: [phone]  Devin Reese is a 84 y.o. male who presents today for f/u.  Hypertension: Notes it has not been very high.  Taking lisinopril.  No chest pain or shortness of breath.  History of a flutter: Followed by cardiology.  No palpitations.  He is on Eliquis.  No bleeding.  He is on Lipitor.  No right upper quadrant pain or myalgias.    Diabetes: Notes his sugars have been running up.  Recently anywhere from 180s up to 400.  No hypoglycemia.  No polyuria or polydipsia.  No dietary changes.  Patient notes significant sun exposure in the past.  Noted to have numerous AK's on his arms.  Several SKs on his ears.  Social History   Tobacco Use  Smoking Status Former Smoker  . Packs/day: 1.00  . Years: 25.00  . Pack years: 25.00  . Types: Cigarettes  Smokeless Tobacco Never Used     ROS see history of present illness  Objective  Physical Exam Vitals:   12/02/17 1447  BP: 116/64  Pulse: (!) 58  Temp: (!) 97.4 F (36.3 C)  SpO2: 98%    BP Readings from Last 3 Encounters:  12/02/17 116/64  11/20/17 122/64  09/30/17 130/60   Wt Readings from Last 3 Encounters:  12/02/17 180 lb 9.6 oz (81.9 kg)  11/20/17 180 lb 6.4 oz (81.8 kg)  09/30/17 186 lb 6.4 oz (84.6 kg)    Physical Exam  Constitutional: No distress.  Cardiovascular: Normal rate, regular rhythm and normal heart sounds.  Pulmonary/Chest: Effort normal and breath sounds normal.  Musculoskeletal: He exhibits no edema.  Neurological: He is alert. Gait normal.  Skin: Skin is warm and dry. He is not diaphoretic.  Scattered actinic keratoses on his bilateral forearms and hands, apparent seborrheic keratoses on the posterior aspect of his right ear and superior aspect of his left ear     Assessment/Plan: Please see individual problem list.  Atrial flutter Normal sinus rhythm today.  He will continue to follow with cardiology.  Check CBC given slight mild anemia  previously.  Hypertension Controlled.  Continue current medication.  Diabetes ([place]) Uncontrolled per home CBGs.  Will check an A1c and likely start back on metformin.  Anisocoria He saw his ophthalmologist and they stated everything was stable from prior.  States they were advised there is nothing to worry about.  Continues to have slightly larger pupil in the left eye.  Hyperlipidemia Continue Lipitor.  AK (actinic keratosis) Refer to dermatology for evaluation.   Orders Placed This Encounter  Procedures  . HgB A1c  . CBC  . Basic Metabolic Panel (BMET)  . Ambulatory referral to Dermatology    Referral Priority:   Routine    Referral Type:   Consultation    Referral Reason:   Specialty Services Required    Requested Specialty:   Dermatology    Number of Visits Requested:   1    No orders of the defined types were placed in this encounter.    Devin Rumps, MD [place]

## 2017-12-02 NOTE — Patient Instructions (Signed)
Nice to see you. We will check lab work today and contact you with the results. We will refer you to dermatology.

## 2017-12-02 NOTE — Assessment & Plan Note (Signed)
Uncontrolled per home CBGs.  Will check an A1c and likely start back on metformin.

## 2017-12-02 NOTE — Assessment & Plan Note (Signed)
He saw his ophthalmologist and they stated everything was stable from prior.  States they were advised there is nothing to worry about.  Continues to have slightly larger pupil in the left eye.

## 2017-12-02 NOTE — Assessment & Plan Note (Signed)
Normal sinus rhythm today.  He will continue to follow with cardiology.  Check CBC given slight mild anemia previously.

## 2017-12-04 ENCOUNTER — Other Ambulatory Visit: Payer: Self-pay

## 2017-12-04 NOTE — Telephone Encounter (Signed)
Last OV 12/02/17 filed under historical

## 2017-12-06 ENCOUNTER — Other Ambulatory Visit: Payer: Self-pay | Admitting: Family Medicine

## 2017-12-06 MED ORDER — METFORMIN HCL 500 MG PO TABS: 500.0000 mg | ORAL_TABLET | Freq: Two times a day (BID) | ORAL | 3 refills | Status: DC

## 2017-12-06 MED ORDER — LISINOPRIL 5 MG PO TABS: 5.0000 mg | ORAL_TABLET | Freq: Every day | ORAL | 1 refills | Status: DC

## 2018-01-15 DIAGNOSIS — J432 Centrilobular emphysema: Secondary | ICD-10-CM | POA: Diagnosis not present

## 2018-01-15 DIAGNOSIS — J449 Chronic obstructive pulmonary disease, unspecified: Secondary | ICD-10-CM | POA: Diagnosis not present

## 2018-01-20 DIAGNOSIS — D485 Neoplasm of uncertain behavior of skin: Secondary | ICD-10-CM | POA: Diagnosis not present

## 2018-01-20 DIAGNOSIS — L570 Actinic keratosis: Secondary | ICD-10-CM | POA: Diagnosis not present

## 2018-01-20 DIAGNOSIS — D225 Melanocytic nevi of trunk: Secondary | ICD-10-CM | POA: Diagnosis not present

## 2018-01-20 DIAGNOSIS — L820 Inflamed seborrheic keratosis: Secondary | ICD-10-CM | POA: Diagnosis not present

## 2018-01-20 DIAGNOSIS — L821 Other seborrheic keratosis: Secondary | ICD-10-CM | POA: Diagnosis not present

## 2018-01-20 DIAGNOSIS — X32XXXA Exposure to sunlight, initial encounter: Secondary | ICD-10-CM | POA: Diagnosis not present

## 2018-02-06 ENCOUNTER — Other Ambulatory Visit: Payer: Self-pay | Admitting: Cardiovascular Disease

## 2018-02-17 ENCOUNTER — Other Ambulatory Visit: Payer: Self-pay | Admitting: Cardiovascular Disease

## 2018-02-17 NOTE — Telephone Encounter (Signed)
Please review for refill, Thanks !  

## 2018-03-05 DIAGNOSIS — E1165 Type 2 diabetes mellitus with hyperglycemia: Secondary | ICD-10-CM | POA: Diagnosis not present

## 2018-03-05 DIAGNOSIS — I2510 Atherosclerotic heart disease of native coronary artery without angina pectoris: Secondary | ICD-10-CM | POA: Diagnosis not present

## 2018-03-05 DIAGNOSIS — I700 Atherosclerosis of aorta: Secondary | ICD-10-CM | POA: Diagnosis not present

## 2018-03-05 DIAGNOSIS — I252 Old myocardial infarction: Secondary | ICD-10-CM | POA: Diagnosis not present

## 2018-03-05 DIAGNOSIS — R69 Illness, unspecified: Secondary | ICD-10-CM | POA: Diagnosis not present

## 2018-03-05 DIAGNOSIS — E039 Hypothyroidism, unspecified: Secondary | ICD-10-CM | POA: Diagnosis not present

## 2018-03-05 DIAGNOSIS — I4892 Unspecified atrial flutter: Secondary | ICD-10-CM | POA: Diagnosis not present

## 2018-03-05 DIAGNOSIS — E785 Hyperlipidemia, unspecified: Secondary | ICD-10-CM | POA: Diagnosis not present

## 2018-03-05 DIAGNOSIS — J449 Chronic obstructive pulmonary disease, unspecified: Secondary | ICD-10-CM | POA: Diagnosis not present

## 2018-03-05 DIAGNOSIS — I10 Essential (primary) hypertension: Secondary | ICD-10-CM | POA: Diagnosis not present

## 2018-03-09 ENCOUNTER — Other Ambulatory Visit: Payer: Self-pay | Admitting: Family Medicine

## 2018-03-10 ENCOUNTER — Other Ambulatory Visit: Payer: Self-pay

## 2018-03-10 MED ORDER — LEVOTHYROXINE SODIUM 25 MCG PO TABS: 25.0000 ug | ORAL_TABLET | Freq: Every day | ORAL | 1 refills | Status: DC

## 2018-03-10 NOTE — Telephone Encounter (Signed)
Last OV 12/02/17 filed under historical

## 2018-03-18 ENCOUNTER — Other Ambulatory Visit: Payer: Self-pay

## 2018-03-18 MED ORDER — APIXABAN 2.5 MG PO TABS: ORAL_TABLET | ORAL | 1 refills | Status: DC

## 2018-03-26 DIAGNOSIS — N183 Chronic kidney disease, stage 3 (moderate): Secondary | ICD-10-CM | POA: Diagnosis not present

## 2018-03-26 DIAGNOSIS — R600 Localized edema: Secondary | ICD-10-CM | POA: Diagnosis not present

## 2018-03-26 DIAGNOSIS — I10 Essential (primary) hypertension: Secondary | ICD-10-CM | POA: Diagnosis not present

## 2018-03-26 DIAGNOSIS — E1129 Type 2 diabetes mellitus with other diabetic kidney complication: Secondary | ICD-10-CM | POA: Diagnosis not present

## 2018-04-10 ENCOUNTER — Encounter: Payer: Self-pay | Admitting: Cardiovascular Disease

## 2018-04-10 ENCOUNTER — Ambulatory Visit: Payer: Medicare HMO | Admitting: Cardiovascular Disease

## 2018-04-10 VITALS — BP 110/58 | HR 56 | Ht 69.0 in | Wt 175.2 lb

## 2018-04-10 DIAGNOSIS — I10 Essential (primary) hypertension: Secondary | ICD-10-CM | POA: Diagnosis not present

## 2018-04-10 DIAGNOSIS — E785 Hyperlipidemia, unspecified: Secondary | ICD-10-CM

## 2018-04-10 DIAGNOSIS — I4892 Unspecified atrial flutter: Secondary | ICD-10-CM | POA: Diagnosis not present

## 2018-04-10 DIAGNOSIS — I25118 Atherosclerotic heart disease of native coronary artery with other forms of angina pectoris: Secondary | ICD-10-CM | POA: Diagnosis not present

## 2018-04-10 NOTE — Progress Notes (Signed)
Cardiology Office Note   Date:  04/10/2018   ID:  Devin Reese, DOB 25-Jun-1927, MRN [id number]  PCP:  Leone Haven, MD  Cardiologist:   Kathlyn Sacramento, MD   Chief Complaint  Patient presents with  . Other    6 month follow up. Meds reviewed by the pt. verbally. "doing well."       History of Present Illness: Devin Reese is a 84 y.o. male who presents for a followup visit regarding coronary artery disease and paroxysmal atrial flutter. He has known history of coronary artery disease status post CABG in 1991 at [facility]. He was diagnosed with atrial flutter in 2014 with subsequent successful cardioversion. No recurrent arrhythmia since then.  Most recent echocardiogram in 2018 showed mildly reduced LV systolic function with an EF of 40 to 45% with inferior wall hypokinesis, mild mitral and aortic regurgitation. A pharmacologic nuclear stress after cardioversion in May of 2014 showed evidence of previous inferior scar with no significant ischemia.  Carvedilol was discontinued recently due to bradycardia.  He had a nuclear stress test in August 2018 when he was hospitalized with hypoglycemia and mildly elevated troponin.  The test showed prior inferior scar with no significant ischemia.  He has been doing well with no recent chest pain, shortness of breath or palpitations.  No dizziness, syncope or presyncope.  Past Medical History:  Diagnosis Date  . Atrial flutter ([place])   . CAD (coronary artery disease)   . CKD (chronic kidney disease), stage II   . COPD (chronic obstructive pulmonary disease) ([place])   . Coronary artery disease    CABG in 1991 at [facility]. Most recent cardiac catheterization in 2440 was complicated by stroke.  . Diabetes mellitus without complication ([place])   . Hyperlipidemia   . Hypertension   . Prostate cancer ([place])   . Stroke [facility])     Past Surgical History:  Procedure Laterality Date  . CARDIAC CATHETERIZATION    . CARDIAC SURGERY    .  CARDIOVERSION  03/29/13  . CORONARY ARTERY BYPASS GRAFT       Current Outpatient Medications  Medication Sig Dispense Refill  . albuterol (PROVENTIL HFA;VENTOLIN HFA) 108 (90 Base) MCG/ACT inhaler Inhale 1-2 puffs into the lungs every 4 (four) hours as needed for wheezing or shortness of breath. 1 Inhaler 2  . apixaban (ELIQUIS) 2.5 MG TABS tablet Take 1 tablet (2.5 mg total) by mouth 2 (two) times daily. 60 tablet 1  . atorvastatin (LIPITOR) 40 MG tablet Take 1 tablet (40 mg total) by mouth daily. 30 tablet 6  . Fluticasone-Umeclidin-Vilant (TRELEGY ELLIPTA IN) Inhale 1 puff into the lungs daily.    Marland Kitchen levothyroxine (SYNTHROID, LEVOTHROID) 25 MCG tablet Take 1 tablet (25 mcg total) by mouth daily before breakfast. 90 tablet 1  . lisinopril (PRINIVIL,ZESTRIL) 5 MG tablet Take 1 tablet (5 mg total) by mouth daily. 90 tablet 0  . metFORMIN (GLUCOPHAGE) 500 MG tablet Take 1 tablet (500 mg total) by mouth 2 (two) times daily with a meal. 180 tablet 3  . nitroGLYCERIN (NITROSTAT) 0.4 MG SL tablet Place 1 tablet (0.4 mg total) under the tongue every 5 (five) minutes as needed for chest pain. 25 tablet 3  . sertraline (ZOLOFT) 50 MG tablet Take 50 mg by mouth daily.     No current facility-administered medications for this visit.     Allergies:   Patient has no known allergies.    Social History:  The patient  reports  that he has quit smoking. His smoking use included cigarettes. He has a 25.00 pack-year smoking history. He has never used smokeless tobacco. He reports that he does not drink alcohol or use drugs.   Family History:  The patient's family history includes Cancer in his unknown relative; Coronary artery disease in his father and unknown relative; Diabetes in his brother; Heart disease in his father.    ROS:  Please see the history of present illness.   Otherwise, review of systems are positive for none.   All other systems are reviewed and negative.    PHYSICAL EXAM: VS:  BP (!)  110/58 (BP Location: Left Arm, Patient Position: Sitting, Cuff Size: Normal)   Pulse (!) 56   Ht 5\' 9"  (1.753 m)   Wt 175 lb 4 oz (79.5 kg)   BMI 25.88 kg/m  , BMI Body mass index is 25.88 kg/m. GEN: Well nourished, well developed, in no acute distress  HEENT: normal  Neck: no JVD, carotid bruits, or masses Cardiac: RRR ; no murmurs, rubs, or gallops,no edema  Respiratory:  clear to auscultation bilaterally, normal work of breathing GI: soft, nontender, nondistended, + BS MS: no deformity or atrophy  Skin: warm and dry, no rash Neuro:  Strength and sensation are intact Psych: euthymic mood, full affect   EKG:  EKG is ordered today. The ekg ordered today demonstrates normal sinus rhythm with PACs.   Recent Labs: 07/07/2017: ALT 10 08/20/2017: TSH 1.19 12/02/2017: BUN 30; Creatinine, Ser 1.43; Hemoglobin 12.2; Platelets 197.0; Potassium 4.6; Sodium 137    Lipid Panel    Component Value Date/Time   CHOL 143 03/20/2017 0826   TRIG 95 03/20/2017 0826   HDL 40 03/20/2017 0826   CHOLHDL 3.6 03/20/2017 0826   LDLCALC 84 03/20/2017 0826      Wt Readings from Last 3 Encounters:  04/10/18 175 lb 4 oz (79.5 kg)  12/02/17 180 lb 9.6 oz (81.9 kg)  11/20/17 180 lb 6.4 oz (81.8 kg)         ASSESSMENT AND PLAN:  1.  Paroxysmal atrial flutter: He continues to be in sinus rhythm with PACs. Continue long-term anticoagulation with Eliquis.  His creatinine fluctuates between 1.4 and 1.5.  Considering his age, I elected to keep him on the 2.5 mg dose of Eliquis.  2. Coronary artery disease involving native coronary arteries with stable angina:    Symptoms seems to be better than last year.  He is not on a beta-blocker due to chronic bradycardia.  3. Essential hypertension: Blood pressure is  well controlled on lisinopril.  4. Hyperlipidemia: Continue atorvastatin 40 mg once daily.  Lipid profile last year showed an LDL of 84.   Disposition:   FU with me in 6  months  Signed,  Kathlyn Sacramento, MD  04/10/2018 9:42 AM    [place]

## 2018-04-10 NOTE — Patient Instructions (Signed)
Medication Instructions: Continue same medications.   Labwork: None.   Procedures/Testing: None.   Follow-Up: 6 months with Dr. Arida.   Any Additional Special Instructions Will Be Listed Below (If Applicable).     If you need a refill on your cardiac medications before your next appointment, please call your pharmacy.   

## 2018-06-04 ENCOUNTER — Encounter: Payer: Self-pay | Admitting: Family Medicine

## 2018-06-04 ENCOUNTER — Ambulatory Visit (INDEPENDENT_AMBULATORY_CARE_PROVIDER_SITE_OTHER): Payer: Medicare HMO | Admitting: Family Medicine

## 2018-06-04 ENCOUNTER — Ambulatory Visit (INDEPENDENT_AMBULATORY_CARE_PROVIDER_SITE_OTHER): Payer: Medicare HMO

## 2018-06-04 VITALS — BP 108/60 | HR 59 | Temp 97.4°F | Ht 66.0 in | Wt 174.0 lb

## 2018-06-04 VITALS — BP 108/60 | HR 59 | Temp 97.4°F | Wt 174.4 lb

## 2018-06-04 DIAGNOSIS — Z7901 Long term (current) use of anticoagulants: Secondary | ICD-10-CM | POA: Diagnosis not present

## 2018-06-04 DIAGNOSIS — J449 Chronic obstructive pulmonary disease, unspecified: Secondary | ICD-10-CM | POA: Diagnosis not present

## 2018-06-04 DIAGNOSIS — Z0000 Encounter for general adult medical examination without abnormal findings: Secondary | ICD-10-CM

## 2018-06-04 DIAGNOSIS — I10 Essential (primary) hypertension: Secondary | ICD-10-CM | POA: Diagnosis not present

## 2018-06-04 DIAGNOSIS — E119 Type 2 diabetes mellitus without complications: Secondary | ICD-10-CM

## 2018-06-04 DIAGNOSIS — E785 Hyperlipidemia, unspecified: Secondary | ICD-10-CM | POA: Diagnosis not present

## 2018-06-04 LAB — CBC
HEMATOCRIT: 36 % — AB (ref 39.0–52.0)
Hemoglobin: 12 g/dL — ABNORMAL LOW (ref 13.0–17.0)
MCHC: 33.5 g/dL (ref 30.0–36.0)
MCV: 93.6 fl (ref 78.0–100.0)
PLATELETS: 192 10*3/uL (ref 150.0–400.0)
RBC: 3.84 Mil/uL — ABNORMAL LOW (ref 4.22–5.81)
RDW: 13.7 % (ref 11.5–15.5)
WBC: 5.3 10*3/uL (ref 4.0–10.5)

## 2018-06-04 LAB — LIPID PANEL
Cholesterol: 136 mg/dL (ref 0–200)
HDL: 41.8 mg/dL (ref >39.00)
LDL Cholesterol: 76 mg/dL (ref 0–99)
NONHDL: 94.27
Total CHOL/HDL Ratio: 3
Triglycerides: 89 mg/dL (ref 0.0–149.0)
VLDL: 17.8 mg/dL (ref 0.0–40.0)

## 2018-06-04 LAB — COMPREHENSIVE METABOLIC PANEL
ALK PHOS: 102 U/L (ref 39–117)
ALT: 17 U/L (ref 0–53)
AST: 22 U/L (ref 0–37)
Albumin: 3.7 g/dL (ref 3.5–5.2)
BUN: 30 mg/dL — ABNORMAL HIGH (ref 6–23)
CHLORIDE: 103 meq/L (ref 96–112)
CO2: 28 meq/L (ref 19–32)
Calcium: 9.2 mg/dL (ref 8.4–10.5)
Creatinine, Ser: 1.76 mg/dL — ABNORMAL HIGH (ref 0.40–1.50)
GFR: 38.9 mL/min — AB (ref >60.00)
GLUCOSE: 161 mg/dL — AB (ref 70–99)
POTASSIUM: 4.7 meq/L (ref 3.5–5.1)
Sodium: 138 mEq/L (ref 135–145)
TOTAL PROTEIN: 6.9 g/dL (ref 6.0–8.3)
Total Bilirubin: 0.5 mg/dL (ref 0.2–1.2)

## 2018-06-04 LAB — HEMOGLOBIN A1C: HEMOGLOBIN A1C: 8.3 % — AB (ref 4.6–6.5)

## 2018-06-04 NOTE — Assessment & Plan Note (Signed)
Continue Eliquis for a flutter history.  Check CBC.

## 2018-06-04 NOTE — Progress Notes (Signed)
Subjective:   Devin Reese is a 84 y.o. male who presents for an Initial Medicare Annual Wellness Visit.  Review of Systems  No ROS.  Medicare Wellness Visit. Additional risk factors are reflected in the social history.  Cardiac Risk Factors include: advanced age (>49men, >73 women);male gender;hypertension;diabetes mellitus    Objective:    Today's Vitals   06/04/18 0941  BP: 108/60  Pulse: (!) 59  Temp: (!) 97.4 F (36.3 C)  TempSrc: Oral  SpO2: 97%  Weight: 174 lb (78.9 kg)  Height: 5\' 6"  (1.676 m)   Body mass index is 28.08 kg/m.  Advanced Directives 06/04/2018 11/20/2017 07/01/2017 07/01/2017  Does Patient Have a Medical Advance Directive? No No No No  Would patient like information on creating a medical advance directive? No - Patient declined No - Patient declined No - Patient declined -    Current Medications (verified) Outpatient Encounter Medications as of 06/04/2018  Medication Sig  . albuterol (PROVENTIL HFA;VENTOLIN HFA) 108 (90 Base) MCG/ACT inhaler Inhale 1-2 puffs into the lungs every 4 (four) hours as needed for wheezing or shortness of breath.  Marland Kitchen apixaban (ELIQUIS) 2.5 MG TABS tablet Take 1 tablet (2.5 mg total) by mouth 2 (two) times daily.  Marland Kitchen atorvastatin (LIPITOR) 40 MG tablet Take 1 tablet (40 mg total) by mouth daily.  . Fluticasone-Umeclidin-Vilant (TRELEGY ELLIPTA IN) Inhale 1 puff into the lungs daily.  Marland Kitchen levothyroxine (SYNTHROID, LEVOTHROID) 25 MCG tablet Take 1 tablet (25 mcg total) by mouth daily before breakfast.  . lisinopril (PRINIVIL,ZESTRIL) 5 MG tablet Take 1 tablet (5 mg total) by mouth daily.  . metFORMIN (GLUCOPHAGE) 500 MG tablet Take 1 tablet (500 mg total) by mouth 2 (two) times daily with a meal.  . nitroGLYCERIN (NITROSTAT) 0.4 MG SL tablet Place 1 tablet (0.4 mg total) under the tongue every 5 (five) minutes as needed for chest pain.  Marland Kitchen sertraline (ZOLOFT) 50 MG tablet Take 50 mg by mouth daily.   No facility-administered  encounter medications on file as of 06/04/2018.     Allergies (verified) Patient has no known allergies.   History: Past Medical History:  Diagnosis Date  . Atrial flutter ([place])   . CAD (coronary artery disease)   . CKD (chronic kidney disease), stage II   . COPD (chronic obstructive pulmonary disease) ([place])   . Coronary artery disease    CABG in 1991 at [facility]. Most recent cardiac catheterization in 9563 was complicated by stroke.  . Diabetes mellitus without complication ([place])   . Hyperlipidemia   . Hypertension   . Prostate cancer ([place])   . Stroke [facility])    Past Surgical History:  Procedure Laterality Date  . CARDIAC CATHETERIZATION    . CARDIAC SURGERY    . CARDIOVERSION  03/29/13  . CORONARY ARTERY BYPASS GRAFT     Family History  Problem Relation Age of Onset  . Heart disease Father   . Coronary artery disease Father   . Cancer Unknown        family hx  . Coronary artery disease Unknown        family hx   . Diabetes Brother    Social History   Socioeconomic History  . Marital status: Married    Spouse name: Not on file  . Number of children: Not on file  . Years of education: Not on file  . Highest education level: Not on file  Occupational History  . Not on file  Social Needs  .  Financial resource strain: Not hard at all  . Food insecurity:    Worry: Never true    Inability: Never true  . Transportation needs:    Medical: No    Non-medical: No  Tobacco Use  . Smoking status: Former Smoker    Packs/day: 1.00    Years: 25.00    Pack years: 25.00    Types: Cigarettes  . Smokeless tobacco: Never Used  Substance and Sexual Activity  . Alcohol use: No    Comment: Rare  . Drug use: No  . Sexual activity: Not on file  Lifestyle  . Physical activity:    Days per week: Not on file    Minutes per session: Not on file  . Stress: Only a little  Relationships  . Social connections:    Talks on phone: Not on file    Gets together: Not on file    Attends  religious service: Not on file    Active member of club or organization: Not on file    Attends meetings of clubs or organizations: Not on file    Relationship status: Not on file  Other Topics Concern  . Not on file  Social History Narrative   ** Merged History Encounter **       Retired, married, does not get regular exercise.    Tobacco Counseling Counseling given: Not Answered   Clinical Intake:  Pre-visit preparation completed: Yes  Pain : No/denies pain     Nutritional Status: BMI 25 -29 Overweight Diabetes: Yes(Followed by pcp)  How often do you need to have someone help you when you read instructions, pamphlets, or other written materials from your doctor or pharmacy?: 1 - Never  Interpreter Needed?: No     Activities of Daily Living In your present state of health, do you have any difficulty performing the following activities: 06/04/2018 07/01/2017  Hearing? Y Y  Comment Hearing aids, bilateral -  Vision? N N  Difficulty concentrating or making decisions? N Y  Walking or climbing stairs? N Y  Dressing or bathing? N N  Doing errands, shopping? N N  Preparing Food and eating ? N -  Comment Wife prepares the food.  Self feeds.  -  Using the Toilet? N -  In the past six months, have you accidently leaked urine? N -  Do you have problems with loss of bowel control? N -  Managing your Medications? N -  Managing your Finances? Y -  Comment Wife assists -  Housekeeping or managing your Housekeeping? N -  Some recent data might be hidden     Immunizations and Health Maintenance Immunization History  Administered Date(s) Administered  . Influenza, High Dose Seasonal PF 08/20/2017  . Pneumococcal Polysaccharide-23 12/25/2015   Health Maintenance Due  Topic Date Due  . TETANUS/TDAP  11[birth year]22/1947  . PNA vac Low Risk Adult (1 of 2 - PCV13) 11/21/1993  . HEMOGLOBIN A1C  06/01/2018    Patient Care Team: Leone Haven, MD as PCP - General (Family  Medicine) Lorelee Market, MD (Family Medicine)  Indicate any recent Medical Services you may have received from other than [facility] providers in the past year (date may be approximate).    Assessment:   This is a routine wellness examination for Martha. The goal of the wellness visit is to assist the patient how to close the gaps in care and create a preventative care plan for the patient.   The roster of all physicians providing medical  care to patient is listed in the Snapshot section of the chart.  Osteoporosis risk reviewed.    Safety issues reviewed; Smoke and carbon monoxide detectors in the home. No firearms in the home. Wears seatbelts when driving or riding with others. No violence in the home.  They do not have excessive sun exposure.  Discussed the need for sun protection: hats, long sleeves and the use of sunscreen if there is significant sun exposure.  Patient is alert, normal appearance, oriented to person/place/and time. Correctly identified the president of the [place] and recalls of 2/3 words.Performs simple calculations and can read correct time from watch face. Displays appropriate judgement.  No new identified risk were noted.  No failures at ADL's or IADL's.    BMI- discussed the importance of a healthy diet, water intake and the benefits of aerobic exercise. Educational material provided.   24 hour diet recall: Generally eats 2 meals per day, snacks in between and late at night. Regular diet. Low carb diet encouraged. Glucerna supplemental drink samples provided to try instead of skipping meals. States blood sugars checked daily and averages 150-165.   Dental- dentures.   Sleep patterns- Sleeps 8 hours at night.  Wakes feeling rested.   Pneumococcal and TDAP vaccine discussed.   Patient Concerns: None at this time. Follow up with PCP as needed.  Hearing/Vision screen Hearing Screening Comments: Followed by [facility] Visits as needed Hearing aid,  bilateral Vision Screening Comments: Followed by [facility] Wears corrective lenses for reading Annual visits; diabetic eye exam No retinopathy reported Cataract extraction, bilateral Visual acuity not assessed per patient preference   Dietary issues and exercise activities discussed: Current Exercise Habits: Home exercise routine, Type of exercise: walking, Time (Minutes): 15, Frequency (Times/Week): 3, Weekly Exercise (Minutes/Week): 45, Intensity: Mild  Goals    . DIET - REDUCE SUGAR INTAKE     -Monitor the amount of sweets and carbs eaten daily. -Eat small portions of watermelon and bananas as a dessert every once in awhile, not daily. -Glucerna supplement drinks instead of skipping a meal or at night for a snack instead of sweets. (Samples and coupons provided) -Educational material provided for diabetic choices. Continue to monitor blood sugar daily.       Depression Screen PHQ 2/9 Scores 06/04/2018 11/20/2017 07/07/2017  PHQ - 2 Score 0 0 0    Fall Risk Fall Risk  06/04/2018 11/20/2017 07/07/2017  Falls in the past year? No No No   Cognitive Function:     6CIT Screen 06/04/2018  What Year? 0 points  What month? 0 points  What time? 0 points  Count back from 20 0 points  Months in reverse 0 points  Repeat phrase 0 points  Total Score 0    Screening Tests Health Maintenance  Topic Date Due  . TETANUS/TDAP  11/22/1947  . PNA vac Low Risk Adult (1 of 2 - PCV13) 11/21/1993  . HEMOGLOBIN A1C  06/01/2018  . INFLUENZA VACCINE  06/18/2018  . OPHTHALMOLOGY EXAM  07/17/2018  . FOOT EXAM  06/05/2019      Plan:    End of life planning; Advanced aging; Advanced directives discussed.  No HCPOA/Living Will.  Additional information declined at this time.  I have personally reviewed and noted the following in the patient's chart:   . Medical and social history . Use of alcohol, tobacco or illicit drugs  . Current medications and supplements . Functional ability and  status . Nutritional status . Physical activity .  Advanced directives . List of other physicians . Hospitalizations, surgeries, and ER visits in previous 12 months . Vitals . Screenings to include cognitive, depression, and falls . Referrals and appointments  In addition, I have reviewed and discussed with patient certain preventive protocols, quality metrics, and best practice recommendations. A written personalized care plan for preventive services as well as general preventive health recommendations were provided to patient.     Varney Biles, LPN   1/98/2429

## 2018-06-04 NOTE — Assessment & Plan Note (Signed)
Check lipid panel  

## 2018-06-04 NOTE — Assessment & Plan Note (Signed)
Continue metformin.  Check A1c. 

## 2018-06-04 NOTE — Assessment & Plan Note (Signed)
Well-controlled.  Continue lisinopril.  Check CMP.

## 2018-06-04 NOTE — Patient Instructions (Addendum)
  Devin Reese , Thank you for taking time to come for your Medicare Wellness Visit. I appreciate your ongoing commitment to your health goals. Please review the following plan we discussed and let me know if I can assist you in the future.   These are the goals we discussed: Goals    . DIET - REDUCE SUGAR INTAKE     -Monitor the amount of sweets and carbs eaten daily. -Eat small portions of watermelon and bananas as a dessert every once in awhile, not daily. -Glucerna supplement drinks instead of skipping a meal or at night for a snack instead of sweets. (Samples and coupons provided) -Educational material provided for diabetic choices. Continue to monitor blood sugar daily.        This is a list of the screening recommended for you and due dates:  Health Maintenance  Topic Date Due  . Tetanus Vaccine  11[birth year]22/1947  . Pneumonia vaccines (1 of 2 - PCV13) 11/21/1993  . Hemoglobin A1C  06/01/2018  . Flu Shot  06/18/2018  . Eye exam for diabetics  07/17/2018  . Complete foot exam   06/05/2019

## 2018-06-04 NOTE — Assessment & Plan Note (Signed)
Symptoms well controlled.  Discussed taking the Trelegy every day though if he is not going to do this at least continuing what he is doing as it has been controlling his symptoms.

## 2018-06-04 NOTE — Progress Notes (Signed)
  [name], MD Phone: [phone]  Devin Reese is a 84 y.o. male who presents today for f/u.  CC: Hypertension, COPD, diabetes, chronic anticoagulation  HYPERTENSION  Disease Monitoring  Home BP Monitoring not checking chest pain-no    dyspnea-no Medications  Compliance-taking lisinopril.  Edema-no  DIABETES Disease Monitoring: Blood Sugar ranges-around 160 polyuria/phagia/dipsia-no     Optho- UTD Medications: Compliance- taking metformin Hypoglycemic symptoms- no  COPD: Medication compliance- takes trelegy some days   Rescue inhaler use- no Dyspnea- no   Wheezing- occasional   Cough- occasional   Productive- no  He notes his hearing is not quite as good as it used to be.  He does wear hearing aids.  He has not had them adjusted or reevaluated recently.  Chronic anticoagulation: Patient is on Eliquis for history of a flutter.  He notes no bleeding issues though does have some bruising.  Due for CBC.   Social History   Tobacco Use  Smoking Status Former Smoker  . Packs/day: 1.00  . Years: 25.00  . Pack years: 25.00  . Types: Cigarettes  Smokeless Tobacco Never Used     ROS see history of present illness  Objective  Physical Exam Vitals:   06/04/18 0757  BP: 108/60  Pulse: (!) 59  Temp: (!) 97.4 F (36.3 C)  SpO2: 97%    BP Readings from Last 3 Encounters:  06/04/18 108/60  04/10/18 (!) 110/58  12/02/17 116/64   Wt Readings from Last 3 Encounters:  06/04/18 174 lb 6.4 oz (79.1 kg)  04/10/18 175 lb 4 oz (79.5 kg)  12/02/17 180 lb 9.6 oz (81.9 kg)    Physical Exam  Constitutional: No distress.  Cardiovascular: Normal rate, regular rhythm and normal heart sounds.  Pulmonary/Chest: Effort normal and breath sounds normal.  Musculoskeletal: He exhibits no edema.  Neurological: He is alert.  Skin: Skin is warm and dry. He is not diaphoretic.   Diabetic Foot Exam - Simple   Simple Foot Form Diabetic Foot exam was performed with the  following findings:  Yes 06/04/2018  8:32 AM  Visual Inspection No deformities, no ulcerations, no other skin breakdown bilaterally:  Yes Sensation Testing See comments:  Yes Pulse Check Posterior Tibialis and Dorsalis pulse intact bilaterally:  Yes Comments Slightly decreased monofilament testing bilateral right slightly greater than left, intact light touch sensation bilaterally      Assessment/Plan: Please see individual problem list.  Hypertension Well-controlled.  Continue lisinopril.  Check CMP.  Chronic anticoagulation Continue Eliquis for a flutter history.  Check CBC.  Hyperlipidemia Check lipid panel.  COPD (chronic obstructive pulmonary disease) (HCC) Symptoms well controlled.  Discussed taking the Trelegy every day though if he is not going to do this at least continuing what he is doing as it has been controlling his symptoms.  Diabetes ([place]) Continue metformin.  Check A1c.  He will see his audiologist regarding his hearing.  Health Maintenance: We will request vaccine records from his pharmacy.  Orders Placed This Encounter  Procedures  . HgB A1c  . Comp Met (CMET)  . CBC  . Lipid panel    No orders of the defined types were placed in this encounter.    [name], MD [place]

## 2018-06-04 NOTE — Patient Instructions (Signed)
Nice to see you. Please continue your current medications. Please try to take the Trelegy every day. We will check lab work and contact you with the results.

## 2018-06-09 ENCOUNTER — Other Ambulatory Visit: Payer: Self-pay | Admitting: Family Medicine

## 2018-06-09 ENCOUNTER — Telehealth: Payer: Self-pay

## 2018-06-09 DIAGNOSIS — N179 Acute kidney failure, unspecified: Secondary | ICD-10-CM

## 2018-06-09 MED ORDER — LINAGLIPTIN 5 MG PO TABS: 5.0000 mg | ORAL_TABLET | Freq: Every day | ORAL | 1 refills | Status: DC

## 2018-06-09 NOTE — Telephone Encounter (Signed)
-----   Message from Leone Haven, MD sent at 06/05/2018  6:07 PM EDT ----- Please let the patient know that his kidney function is slightly worse than prior with some possible dehydration.  He should try to maintain adequate hydration and we can recheck at the end of this coming week.  His A1c is improved though still not quite at goal.  I would suggest adding an additional medication to help with his diabetes.  His cholesterol is adequately controlled for his age.  His hemoglobin is stable.

## 2018-06-12 ENCOUNTER — Telehealth: Payer: Self-pay

## 2018-06-12 ENCOUNTER — Other Ambulatory Visit (INDEPENDENT_AMBULATORY_CARE_PROVIDER_SITE_OTHER): Payer: Medicare HMO

## 2018-06-12 DIAGNOSIS — N179 Acute kidney failure, unspecified: Secondary | ICD-10-CM

## 2018-06-12 LAB — BASIC METABOLIC PANEL
BUN: 33 mg/dL — ABNORMAL HIGH (ref 6–23)
CHLORIDE: 104 meq/L (ref 96–112)
CO2: 25 mEq/L (ref 19–32)
Calcium: 9.3 mg/dL (ref 8.4–10.5)
Creatinine, Ser: 1.86 mg/dL — ABNORMAL HIGH (ref 0.40–1.50)
GFR: 36.49 mL/min — AB (ref >60.00)
Glucose, Bld: 157 mg/dL — ABNORMAL HIGH (ref 70–99)
POTASSIUM: 5 meq/L (ref 3.5–5.1)
SODIUM: 137 meq/L (ref 135–145)

## 2018-06-12 NOTE — Telephone Encounter (Signed)
-----   Message from Leone Haven, MD sent at 06/12/2018 10:57 AM EDT ----- Please let the patient know that his kidney function appears similar to prior.  He may be slightly dehydrated or this potentially could represent progression of his chronic kidney disease.  He should try to maintain hydration and we can recheck in 1 to 2 months. please place an order for a BMP for AKI.

## 2018-07-12 ENCOUNTER — Other Ambulatory Visit: Payer: Self-pay | Admitting: Family Medicine

## 2018-07-15 DIAGNOSIS — J449 Chronic obstructive pulmonary disease, unspecified: Secondary | ICD-10-CM | POA: Diagnosis not present

## 2018-07-21 ENCOUNTER — Other Ambulatory Visit: Payer: Self-pay | Admitting: Family Medicine

## 2018-08-13 ENCOUNTER — Other Ambulatory Visit (INDEPENDENT_AMBULATORY_CARE_PROVIDER_SITE_OTHER): Payer: Medicare HMO

## 2018-08-13 DIAGNOSIS — N179 Acute kidney failure, unspecified: Secondary | ICD-10-CM | POA: Diagnosis not present

## 2018-08-13 LAB — BASIC METABOLIC PANEL
BUN: 21 mg/dL (ref 6–23)
CO2: 27 meq/L (ref 19–32)
CREATININE: 1.65 mg/dL — AB (ref 0.40–1.50)
Calcium: 8.9 mg/dL (ref 8.4–10.5)
Chloride: 106 mEq/L (ref 96–112)
GFR: 41.89 mL/min — ABNORMAL LOW (ref >60.00)
Glucose, Bld: 144 mg/dL — ABNORMAL HIGH (ref 70–99)
Potassium: 4.6 mEq/L (ref 3.5–5.1)
Sodium: 139 mEq/L (ref 135–145)

## 2018-09-17 ENCOUNTER — Other Ambulatory Visit: Payer: Self-pay | Admitting: Cardiovascular Disease

## 2018-09-17 NOTE — Telephone Encounter (Signed)
Refill Request.  

## 2018-09-21 IMAGING — CR DG HUMERUS 2V *R*
2 series · 2 of 2 positions shown · non-contrast
Comparison: None.

CLINICAL DATA: Recent fall with right arm pain, initial encounter

EXAM:
RIGHT HUMERUS - 2+ VIEW

[humerus ap]
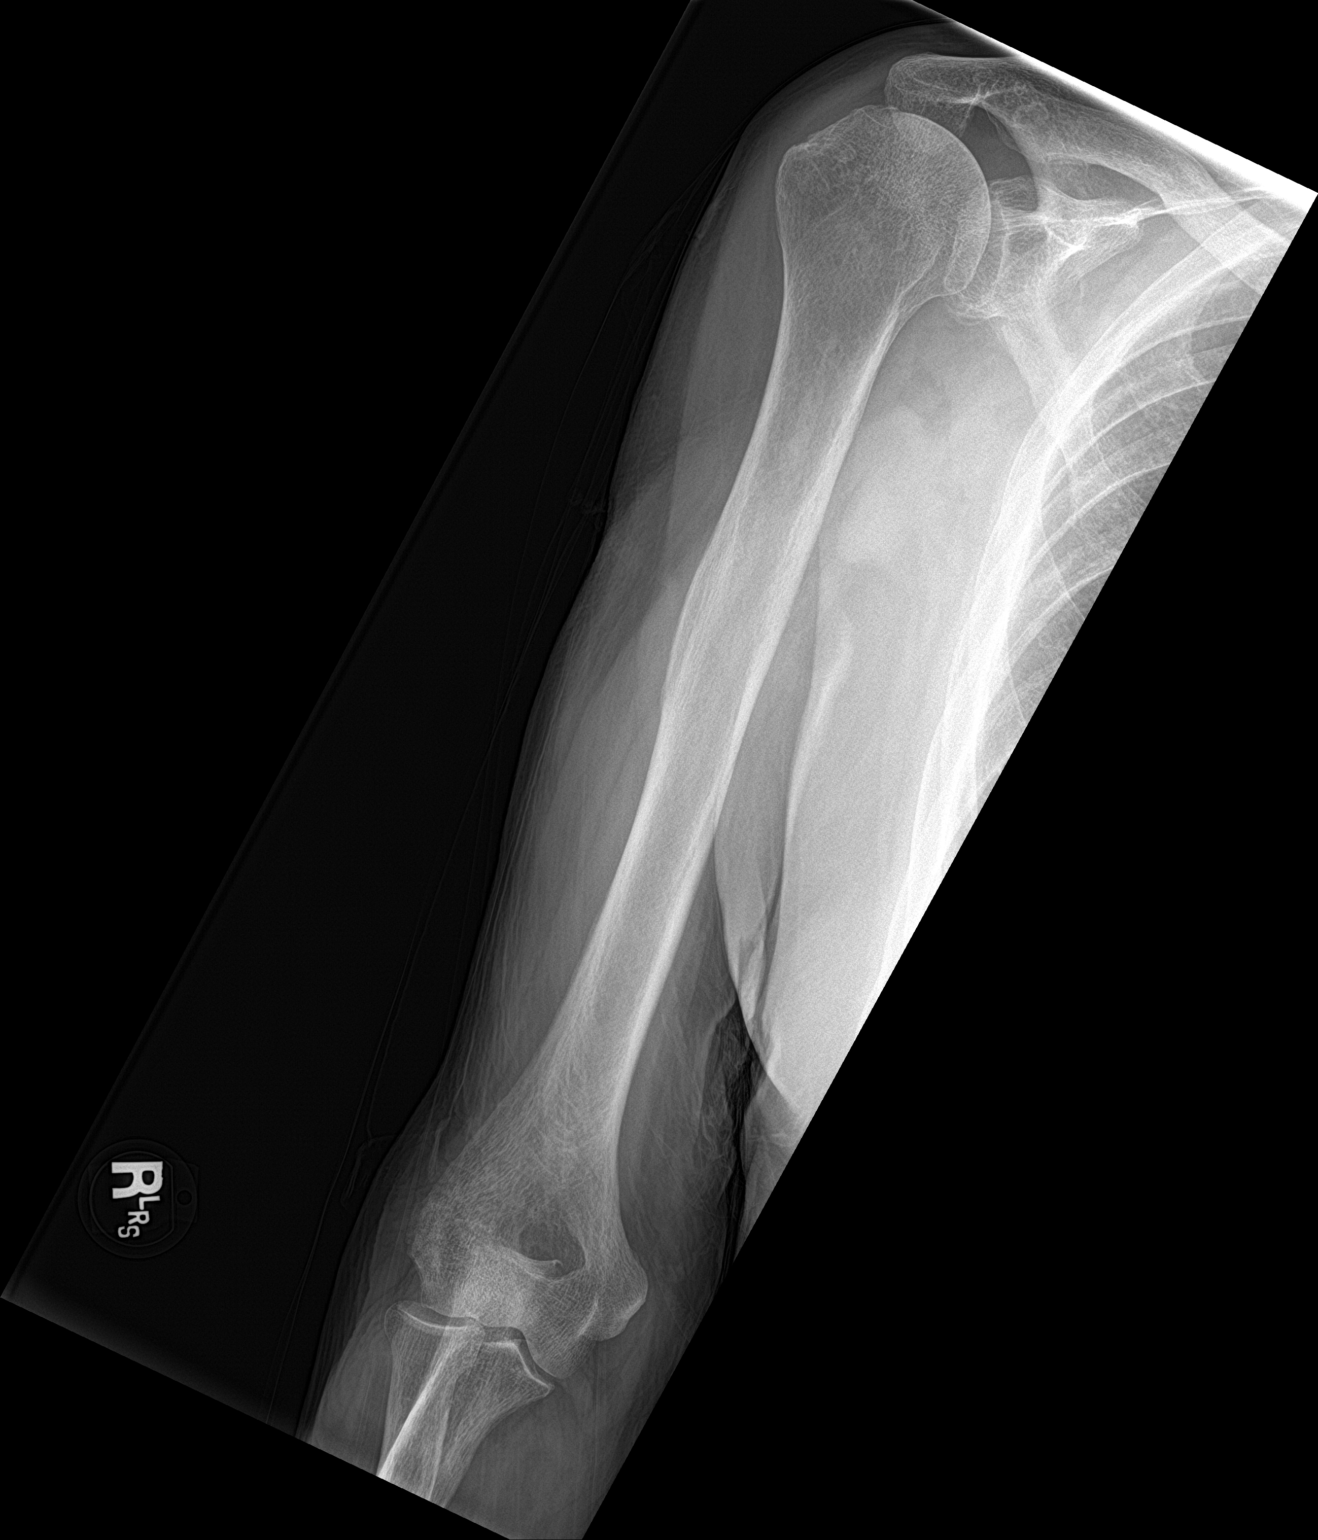

[humerus lat]
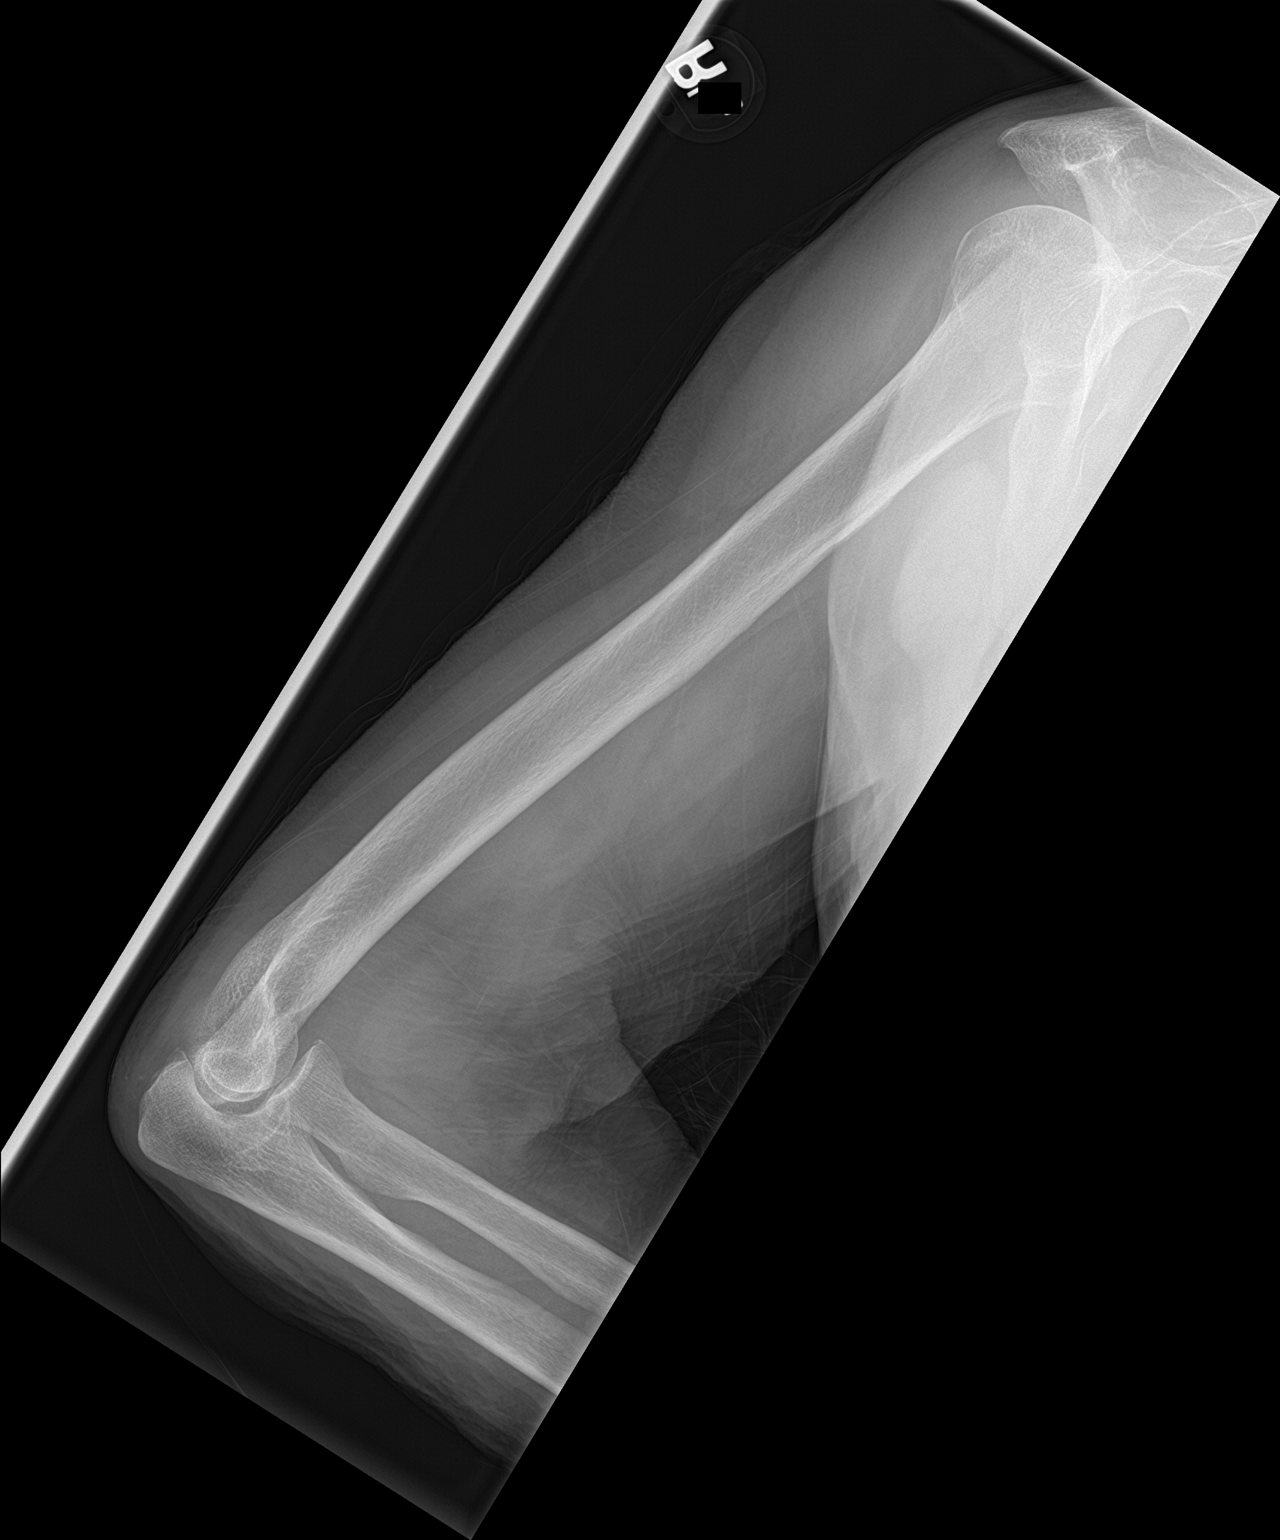

[2 of 2 positions shown; findings below may reference images not displayed]

FINDINGS: There is no evidence of fracture or other focal bone lesions. Soft
tissues are unremarkable.
IMPRESSION: No acute abnormality noted.

## 2018-09-23 ENCOUNTER — Ambulatory Visit: Payer: Medicare HMO | Admitting: Family Medicine

## 2018-09-29 ENCOUNTER — Encounter: Payer: Self-pay | Admitting: Family Medicine

## 2018-09-29 ENCOUNTER — Ambulatory Visit (INDEPENDENT_AMBULATORY_CARE_PROVIDER_SITE_OTHER): Payer: Medicare HMO | Admitting: Family Medicine

## 2018-09-29 VITALS — BP 108/60 | HR 56 | Temp 97.8°F | Ht 66.0 in | Wt 177.2 lb

## 2018-09-29 DIAGNOSIS — I10 Essential (primary) hypertension: Secondary | ICD-10-CM | POA: Diagnosis not present

## 2018-09-29 DIAGNOSIS — Z23 Encounter for immunization: Secondary | ICD-10-CM | POA: Diagnosis not present

## 2018-09-29 DIAGNOSIS — I4892 Unspecified atrial flutter: Secondary | ICD-10-CM

## 2018-09-29 DIAGNOSIS — E119 Type 2 diabetes mellitus without complications: Secondary | ICD-10-CM | POA: Diagnosis not present

## 2018-09-29 DIAGNOSIS — L570 Actinic keratosis: Secondary | ICD-10-CM | POA: Diagnosis not present

## 2018-09-29 LAB — POCT GLYCOSYLATED HEMOGLOBIN (HGB A1C): Hemoglobin A1C: 6.4 % — AB (ref 4.0–5.6)

## 2018-09-29 NOTE — Assessment & Plan Note (Signed)
No longer on medication.  He will continue to monitor BP at home.

## 2018-09-29 NOTE — Patient Instructions (Signed)
Nice to see you. We will check an A1c and contact you with the results.

## 2018-09-29 NOTE — Assessment & Plan Note (Signed)
Check A1c.  Continue Tradjenta.

## 2018-09-29 NOTE — Progress Notes (Signed)
  [name], MD Phone: [phone]  Devin Reese is a 84 y.o. male who presents today for follow-up.  CC: Hypertension, diabetes, atrial flutter, actinic keratoses  Hypertension: Not typically this low.  He is no longer on any medications.  No chest pain, shortness of breath, or edema.  No lightheadedness.  Diabetes: Typically 140s fasting.  Taking Tradjenta.  Occasional polyuria.  He stopped the metformin.  No hypoglycemia.  He is not quite due for ophthalmology.  He sees them once a year.  Atrial flutter: Taking Eliquis.  No bleeding issues.  No palpitations.  Actinic keratoses: He reports he saw dermatology over the summer and had several spots frozen.  He sees them typically once yearly.  Social History   Tobacco Use  Smoking Status Former Smoker  . Packs/day: 1.00  . Years: 25.00  . Pack years: 25.00  . Types: Cigarettes  Smokeless Tobacco Never Used     ROS see history of present illness  Objective  Physical Exam Vitals:   09/29/18 1454  BP: 108/60  Pulse: (!) 56  Temp: 97.8 F (36.6 C)  SpO2: 98%    BP Readings from Last 3 Encounters:  09/29/18 108/60  06/04/18 108/60  06/04/18 108/60   Wt Readings from Last 3 Encounters:  09/29/18 177 lb 3.2 oz (80.4 kg)  06/04/18 174 lb (78.9 kg)  06/04/18 174 lb 6.4 oz (79.1 kg)    Physical Exam  Constitutional: No distress.  Cardiovascular: Normal rate, regular rhythm and normal heart sounds.  Pulmonary/Chest: Effort normal and breath sounds normal.  Musculoskeletal: He exhibits no edema.  Neurological: He is alert.  Skin: Skin is warm and dry. He is not diaphoretic.     Assessment/Plan: Please see individual problem list.  Atrial flutter Sinus rhythm.  Continue current regimen.  Continue to follow with cardiology.  Hypertension No longer on medication.  He will continue to monitor BP at home.  Diabetes (HCC) Check A1c.  Continue Tradjenta.  AK (actinic keratosis) Patient will  continue to see dermatology.    Orders Placed This Encounter  Procedures  . Flu vaccine HIGH DOSE PF (Fluzone High dose)  . POCT HgB A1C    No orders of the defined types were placed in this encounter.    [name], MD [place]

## 2018-09-29 NOTE — Assessment & Plan Note (Signed)
Patient will continue to see dermatology. 

## 2018-09-29 NOTE — Assessment & Plan Note (Signed)
Sinus rhythm.  Continue current regimen.  Continue to follow with cardiology.

## 2018-10-01 DIAGNOSIS — I129 Hypertensive chronic kidney disease with stage 1 through stage 4 chronic kidney disease, or unspecified chronic kidney disease: Secondary | ICD-10-CM | POA: Diagnosis not present

## 2018-10-01 DIAGNOSIS — E1129 Type 2 diabetes mellitus with other diabetic kidney complication: Secondary | ICD-10-CM | POA: Diagnosis not present

## 2018-10-01 DIAGNOSIS — N183 Chronic kidney disease, stage 3 (moderate): Secondary | ICD-10-CM | POA: Diagnosis not present

## 2018-10-16 ENCOUNTER — Other Ambulatory Visit: Payer: Self-pay | Admitting: Cardiovascular Disease

## 2018-10-20 ENCOUNTER — Other Ambulatory Visit: Payer: Self-pay | Admitting: Family Medicine

## 2018-11-02 ENCOUNTER — Other Ambulatory Visit: Payer: Self-pay

## 2018-11-13 ENCOUNTER — Other Ambulatory Visit: Payer: Self-pay | Admitting: Family Medicine

## 2018-11-13 ENCOUNTER — Other Ambulatory Visit: Payer: Self-pay | Admitting: Cardiovascular Disease

## 2018-12-07 ENCOUNTER — Ambulatory Visit: Payer: Medicare HMO | Admitting: Family Medicine

## 2018-12-10 DIAGNOSIS — R600 Localized edema: Secondary | ICD-10-CM | POA: Diagnosis not present

## 2018-12-10 DIAGNOSIS — I10 Essential (primary) hypertension: Secondary | ICD-10-CM | POA: Diagnosis not present

## 2018-12-10 DIAGNOSIS — E1129 Type 2 diabetes mellitus with other diabetic kidney complication: Secondary | ICD-10-CM | POA: Diagnosis not present

## 2018-12-10 DIAGNOSIS — R809 Proteinuria, unspecified: Secondary | ICD-10-CM | POA: Diagnosis not present

## 2018-12-10 DIAGNOSIS — N183 Chronic kidney disease, stage 3 (moderate): Secondary | ICD-10-CM | POA: Diagnosis not present

## 2019-01-28 ENCOUNTER — Ambulatory Visit (INDEPENDENT_AMBULATORY_CARE_PROVIDER_SITE_OTHER): Payer: Medicare HMO | Admitting: Family Medicine

## 2019-01-28 ENCOUNTER — Encounter: Payer: Self-pay | Admitting: Family Medicine

## 2019-01-28 ENCOUNTER — Other Ambulatory Visit: Payer: Self-pay

## 2019-01-28 DIAGNOSIS — I10 Essential (primary) hypertension: Secondary | ICD-10-CM | POA: Diagnosis not present

## 2019-01-28 DIAGNOSIS — I4892 Unspecified atrial flutter: Secondary | ICD-10-CM | POA: Diagnosis not present

## 2019-01-28 DIAGNOSIS — J449 Chronic obstructive pulmonary disease, unspecified: Secondary | ICD-10-CM

## 2019-01-28 DIAGNOSIS — E119 Type 2 diabetes mellitus without complications: Secondary | ICD-10-CM | POA: Diagnosis not present

## 2019-01-28 LAB — BASIC METABOLIC PANEL
BUN: 40 mg/dL — AB (ref 6–23)
CALCIUM: 9.3 mg/dL (ref 8.4–10.5)
CO2: 25 mEq/L (ref 19–32)
CREATININE: 2.1 mg/dL — AB (ref 0.40–1.50)
Chloride: 103 mEq/L (ref 96–112)
GFR: 29.81 mL/min — ABNORMAL LOW (ref >60.00)
Glucose, Bld: 200 mg/dL — ABNORMAL HIGH (ref 70–99)
Potassium: 4.9 mEq/L (ref 3.5–5.1)
Sodium: 137 mEq/L (ref 135–145)

## 2019-01-28 LAB — MICROALBUMIN / CREATININE URINE RATIO
Creatinine,U: 146.2 mg/dL
Microalb Creat Ratio: 0.5 mg/g (ref 0.0–30.0)
Microalb, Ur: 0.8 mg/dL (ref 0.0–1.9)

## 2019-01-28 LAB — HEMOGLOBIN A1C: HEMOGLOBIN A1C: 7 % — AB (ref 4.6–6.5)

## 2019-01-28 MED ORDER — PNEUMOCOCCAL 13-VAL CONJ VACC IM SUSP: 0.5000 mL | Freq: Once | INTRAMUSCULAR | 0 refills | Status: AC

## 2019-01-28 NOTE — Assessment & Plan Note (Signed)
Adequately controlled.  Appears that he has been on medication recently.  Will check a BMP.

## 2019-01-28 NOTE — Assessment & Plan Note (Signed)
Adequately controlled.  Continue current regimen. 

## 2019-01-28 NOTE — Assessment & Plan Note (Signed)
Doing well with regards to this.  Rate is controlled without medication.  He will monitor.

## 2019-01-28 NOTE — Assessment & Plan Note (Signed)
Seems to be well controlled.  Continue Tradjenta.  Encouraged to contact ophthalmology to see if he is due for a visit.

## 2019-01-28 NOTE — Progress Notes (Signed)
  [name], MD Phone: [phone]  Devin Reese is a 85 y.o. male who presents today for f/u.  Atrial flutter: Patient notes no palpitations or bleeding issues.  He is taking Eliquis.  Diabetes: Notes typically runs in the 120s to 140s.  Taking Tradjenta.  No polyuria or polydipsia.  He had a hypoglycemic episode several weeks ago where is not feeling good.  He ate something and felt better.  He reports having seen ophthalmology in the past year or so.  Hypertension: They report he is taking lisinopril.  No chest pain.  No edema.  He notes no change to his chronic dyspnea.  COPD: He is using his inhalers.  Does not have to use the albuterol very frequently.  No cough or wheezing.  No change in his breathing.  Social History   Tobacco Use  Smoking Status Former Smoker  . Packs/day: 1.00  . Years: 25.00  . Pack years: 25.00  . Types: Cigarettes  Smokeless Tobacco Never Used     ROS see history of present illness  Objective  Physical Exam Vitals:   01/28/19 1014  BP: (!) 120/50  Pulse: (!) 56  Temp: (!) 97.4 F (36.3 C)  SpO2: 96%    BP Readings from Last 3 Encounters:  01/28/19 (!) 120/50  09/29/18 108/60  06/04/18 108/60   Wt Readings from Last 3 Encounters:  01/28/19 178 lb 12.8 oz (81.1 kg)  09/29/18 177 lb 3.2 oz (80.4 kg)  06/04/18 174 lb (78.9 kg)    Physical Exam Constitutional:      General: He is not in acute distress.    Appearance: He is not diaphoretic.  Cardiovascular:     Rate and Rhythm: Regular rhythm. Bradycardia present.     Heart sounds: Normal heart sounds.  Pulmonary:     Effort: Pulmonary effort is normal.     Breath sounds: Normal breath sounds.  Musculoskeletal:     Right lower leg: No edema.     Left lower leg: No edema.  Skin:    General: Skin is warm and dry.  Neurological:     Mental Status: He is alert.      Assessment/Plan: Please see individual problem list.  Atrial flutter Doing well with regards  to this.  Rate is controlled without medication.  He will monitor.  Hypertension Adequately controlled.  Appears that he has been on medication recently.  Will check a BMP.  Diabetes ([place]) Seems to be well controlled.  Continue Tradjenta.  Encouraged to contact ophthalmology to see if he is due for a visit.  COPD (chronic obstructive pulmonary disease) (HCC) Adequately controlled.  Continue current regimen.   Health Maintenance: Prevnar ordered to be done at pharmacy.  Orders Placed This Encounter  Procedures  . HgB A1c  . Basic Metabolic Panel (BMET)  . Urine Microalbumin w/creat. ratio    Meds ordered this encounter  Medications  . pneumococcal 13-valent conjugate vaccine (PREVNAR 13) SUSP injection    Sig: Inject 0.5 mLs into the muscle once for 1 dose.    Dispense:  0.5 mL    Refill:  0     [name], MD [place]

## 2019-01-29 ENCOUNTER — Ambulatory Visit: Payer: Medicare HMO | Admitting: Family Medicine

## 2019-02-01 ENCOUNTER — Other Ambulatory Visit: Payer: Medicare HMO

## 2019-02-01 ENCOUNTER — Other Ambulatory Visit: Payer: Self-pay | Admitting: Family Medicine

## 2019-02-01 DIAGNOSIS — N179 Acute kidney failure, unspecified: Secondary | ICD-10-CM

## 2019-02-01 DIAGNOSIS — R69 Illness, unspecified: Secondary | ICD-10-CM | POA: Diagnosis not present

## 2019-02-02 ENCOUNTER — Other Ambulatory Visit: Payer: Self-pay

## 2019-02-02 ENCOUNTER — Other Ambulatory Visit (INDEPENDENT_AMBULATORY_CARE_PROVIDER_SITE_OTHER): Payer: Medicare HMO

## 2019-02-02 DIAGNOSIS — N179 Acute kidney failure, unspecified: Secondary | ICD-10-CM | POA: Diagnosis not present

## 2019-02-02 LAB — BASIC METABOLIC PANEL
BUN: 42 mg/dL — ABNORMAL HIGH (ref 6–23)
CO2: 27 mEq/L (ref 19–32)
Calcium: 9 mg/dL (ref 8.4–10.5)
Chloride: 106 mEq/L (ref 96–112)
Creatinine, Ser: 1.93 mg/dL — ABNORMAL HIGH (ref 0.40–1.50)
GFR: 32.85 mL/min — ABNORMAL LOW (ref >60.00)
GLUCOSE: 202 mg/dL — AB (ref 70–99)
Potassium: 4.9 mEq/L (ref 3.5–5.1)
Sodium: 140 mEq/L (ref 135–145)

## 2019-02-02 NOTE — Addendum Note (Signed)
Addended by: Arby Barrette on: 02/02/2019 11:22 AM   Modules accepted: Orders

## 2019-03-09 ENCOUNTER — Other Ambulatory Visit: Payer: Self-pay | Admitting: Cardiovascular Disease

## 2019-03-09 ENCOUNTER — Telehealth: Payer: Self-pay

## 2019-03-09 ENCOUNTER — Other Ambulatory Visit: Payer: Self-pay | Admitting: Family Medicine

## 2019-03-09 NOTE — Telephone Encounter (Signed)

## 2019-03-09 NOTE — Telephone Encounter (Signed)
Patient overdue for F/U. Please call to schedule. Thank you!

## 2019-03-10 ENCOUNTER — Other Ambulatory Visit: Payer: Self-pay

## 2019-03-10 ENCOUNTER — Encounter: Payer: Self-pay | Admitting: Cardiovascular Disease

## 2019-03-10 ENCOUNTER — Telehealth (INDEPENDENT_AMBULATORY_CARE_PROVIDER_SITE_OTHER): Payer: Medicare HMO | Admitting: Cardiovascular Disease

## 2019-03-10 VITALS — BP 119/66 | HR 77 | Ht 66.0 in | Wt 178.8 lb

## 2019-03-10 DIAGNOSIS — I2510 Atherosclerotic heart disease of native coronary artery without angina pectoris: Secondary | ICD-10-CM

## 2019-03-10 DIAGNOSIS — I4892 Unspecified atrial flutter: Secondary | ICD-10-CM

## 2019-03-10 MED ORDER — LISINOPRIL 5 MG PO TABS: 5.0000 mg | ORAL_TABLET | Freq: Every day | ORAL | 2 refills | Status: DC

## 2019-03-10 MED ORDER — LINAGLIPTIN 5 MG PO TABS: ORAL_TABLET | ORAL | 2 refills | Status: DC

## 2019-03-10 NOTE — Patient Instructions (Signed)
Medication Instructions:  No change in medications If you need a refill on your cardiac medications before your next appointment, please call your pharmacy.   Lab work: None If you have labs (blood work) drawn today and your tests are completely normal, you will receive your results only by: . MyChart Message (if you have MyChart) OR . A paper copy in the mail If you have any lab test that is abnormal or we need to change your treatment, we will call you to review the results.  Testing/Procedures: None  Follow-Up: At [facility], you and your health needs are our priority.  As part of our continuing mission to provide you with exceptional heart care, we have created designated Provider Care Teams.  These Care Teams include your primary Cardiologist (physician) and Advanced Practice Providers (APPs -  Physician Assistants and Nurse Practitioners) who all work together to provide you with the care you need, when you need it. You will need a follow up appointment in 6 months.  Please call our office 2 months in advance to schedule this appointment.  You may see Ikran Patman, MD or one of the following Advanced Practice Providers on your designated Care Team:   Christopher Berge, NP Ryan Dunn, PA-C . Jacquelyn Visser, PA-C   

## 2019-03-10 NOTE — Progress Notes (Signed)
Virtual Visit via Telephone Note   This visit type was conducted due to national recommendations for restrictions regarding the COVID-19 Pandemic (e.g. social distancing) in an effort to limit this patient's exposure and mitigate transmission in our community.  Due to his co-morbid illnesses, this patient is at least at moderate risk for complications without adequate follow up.  This format is felt to be most appropriate for this patient at this time.  The patient did not have access to video technology/had technical difficulties with video requiring transitioning to audio format only (telephone).  All issues noted in this document were discussed and addressed.  No physical exam could be performed with this format.  Please refer to the patient's chart for his  consent to telehealth for [facility].   Evaluation Performed:  Follow-up visit  Date:  03/10/2019   ID:  Devin Reese, Devin Reese 06/07/1927, MRN [id number]  Patient Location: Home Provider Location: Office  PCP:  Leone Haven, MD  Cardiologist:  Kathlyn Sacramento, MD  Electrophysiologist:  None   Chief Complaint: Follow-up visit regarding atrial flutter and coronary artery disease  History of Present Illness:    Devin Reese is a 85 y.o. male who was reached by telephone for a follow-up visit. He has known history of coronary artery disease status post CABG in 1991 at [facility]. He was diagnosed with atrial flutter in 2014 with subsequent successful cardioversion. No recurrent arrhythmia since then.  Most recent echocardiogram in 2018 showed mildly reduced LV systolic function with an EF of 40 to 45% with inferior wall hypokinesis, mild mitral and aortic regurgitation. A pharmacologic nuclear stress after cardioversion in May of 2014 showed evidence of previous inferior scar with no significant ischemia.  Carvedilol was discontinued recently due to bradycardia.  He had a nuclear stress test in August 2018 when he was  hospitalized with hypoglycemia and mildly elevated troponin.  The test showed prior inferior scar with no significant ischemia.  He fell off a ladder last week from a relatively short height.  He injured his right side of the chest but no other obvious injuries.  He did not notice any bleeding or bruising.  The area it continues to be sore.  Otherwise he has been doing well with no chest pain, shortness of breath or palpitations  The patient does not have symptoms concerning for COVID-19 infection (fever, chills, cough, or new shortness of breath).    Past Medical History:  Diagnosis Date  . Atrial flutter ([place])   . CAD (coronary artery disease)   . CKD (chronic kidney disease), stage II   . COPD (chronic obstructive pulmonary disease) ([place])   . Coronary artery disease    CABG in 1991 at [facility]. Most recent cardiac catheterization in 9449 was complicated by stroke.  . Diabetes mellitus without complication ([place])   . Hyperlipidemia   . Hypertension   . Prostate cancer ([place])   . Stroke [facility])    Past Surgical History:  Procedure Laterality Date  . CARDIAC CATHETERIZATION    . CARDIAC SURGERY    . CARDIOVERSION  03/29/13  . CORONARY ARTERY BYPASS GRAFT       Current Meds  Medication Sig  . albuterol (PROVENTIL HFA;VENTOLIN HFA) 108 (90 Base) MCG/ACT inhaler Inhale 1-2 puffs into the lungs every 4 (four) hours as needed for wheezing or shortness of breath.  Marland Kitchen atorvastatin (LIPITOR) 40 MG tablet Take 1 tablet (40 mg total) by mouth daily.  Marland Kitchen ELIQUIS 2.5 MG TABS  tablet Take 1 tablet (2.5 mg total) by mouth 2 (two) times daily.  . Fluticasone-Umeclidin-Vilant (TRELEGY ELLIPTA IN) Inhale 1 puff into the lungs daily.  Marland Kitchen levothyroxine (SYNTHROID) 25 MCG tablet Take 1 tablet (25 mcg total) by mouth daily before breakfast.  . lisinopril (ZESTRIL) 5 MG tablet Take 1 tablet (5 mg total) by mouth daily.  . nitroGLYCERIN (NITROSTAT) 0.4 MG SL tablet Place 1 tablet (0.4 mg total) under the tongue  every 5 (five) minutes as needed for chest pain.  Marland Kitchen sertraline (ZOLOFT) 50 MG tablet Take 50 mg by mouth daily.  . TRADJENTA 5 MG TABS tablet Take 1 tablet (5 mg total) by mouth daily.     Allergies:   Patient has no known allergies.   Social History   Tobacco Use  . Smoking status: Former Smoker    Packs/day: 1.00    Years: 25.00    Pack years: 25.00    Types: Cigarettes  . Smokeless tobacco: Never Used  Substance Use Topics  . Alcohol use: No    Comment: Rare  . Drug use: No     Family Hx: The patient's family history includes Cancer in his unknown relative; Coronary artery disease in his father and unknown relative; Diabetes in his brother; Heart disease in his father.  ROS:   Please see the history of present illness.     All other systems reviewed and are negative.   Prior CV studies:   The following studies were reviewed today:    Labs/Other Tests and Data Reviewed:    EKG:  No ECG reviewed.  Recent Labs: 06/04/2018: ALT 17; Hemoglobin 12.0; Platelets 192.0 02/02/2019: BUN 42; Creatinine, Ser 1.93; Potassium 4.9; Sodium 140   Recent Lipid Panel Lab Results  Component Value Date/Time   CHOL 136 06/04/2018 08:22 AM   CHOL 143 03/20/2017 08:26 AM   TRIG 89.0 06/04/2018 08:22 AM   HDL 41.80 06/04/2018 08:22 AM   HDL 40 03/20/2017 08:26 AM   CHOLHDL 3 06/04/2018 08:22 AM   LDLCALC 76 06/04/2018 08:22 AM   LDLCALC 84 03/20/2017 08:26 AM    Wt Readings from Last 3 Encounters:  03/10/19 178 lb 12.8 oz (81.1 kg)  01/28/19 178 lb 12.8 oz (81.1 kg)  09/29/18 177 lb 3.2 oz (80.4 kg)     Objective:    Vital Signs:  BP 119/66   Pulse 77   Ht 5\' 6"  (1.676 m)   Wt 178 lb 12.8 oz (81.1 kg)   BMI 28.86 kg/m     ASSESSMENT & PLAN:    1.  Paroxysmal atrial flutter: No evidence of recurrent arrhythmia and heart rate seems to be controlled.   Continue long-term anticoagulation with low-dose Eliquis given age and chronic kidney disease.  2. Coronary artery  disease involving native coronary arteries with stable angina:    Symptoms seems to be better than last year.  He is not on a beta-blocker due to chronic bradycardia.  3. Essential hypertension: Blood pressure is  well controlled on lisinopril.  4. Hyperlipidemia: Continue atorvastatin 40 mg once daily.   Most recent lipid profile showed an LDL of 76.  5.  Recent fall: No syncope.  No obvious physical injuries according to him other than sore right chest.  No significant bleeding or bruising.  It is possible he might have a rib fracture but the treatment for is conservative.  I asked him to use Tylenol and a heating pad as needed and if there is no significant  improvement in another week, he should contact his primary care physician.    COVID-19 Education: The signs and symptoms of COVID-19 were discussed with the patient and how to seek care for testing (follow up with PCP or arrange E-visit).  The importance of social distancing was discussed today.  Time:   Today, I have spent 23 minutes with the patient with telehealth technology discussing the above problems.     Medication Adjustments/Labs and Tests Ordered: Current medicines are reviewed at length with the patient today.  Concerns regarding medicines are outlined above.   Tests Ordered: No orders of the defined types were placed in this encounter.   Medication Changes: No orders of the defined types were placed in this encounter.   Disposition:  Follow up in 6 month(s)  Signed, Kathlyn Sacramento, MD  03/10/2019 10:12 AM    [place]

## 2019-04-30 DIAGNOSIS — I129 Hypertensive chronic kidney disease with stage 1 through stage 4 chronic kidney disease, or unspecified chronic kidney disease: Secondary | ICD-10-CM | POA: Diagnosis not present

## 2019-04-30 DIAGNOSIS — N183 Chronic kidney disease, stage 3 (moderate): Secondary | ICD-10-CM | POA: Diagnosis not present

## 2019-04-30 DIAGNOSIS — E1129 Type 2 diabetes mellitus with other diabetic kidney complication: Secondary | ICD-10-CM | POA: Diagnosis not present

## 2019-04-30 DIAGNOSIS — R600 Localized edema: Secondary | ICD-10-CM | POA: Diagnosis not present

## 2019-05-18 DIAGNOSIS — X32XXXA Exposure to sunlight, initial encounter: Secondary | ICD-10-CM | POA: Diagnosis not present

## 2019-05-18 DIAGNOSIS — L570 Actinic keratosis: Secondary | ICD-10-CM | POA: Diagnosis not present

## 2019-05-18 DIAGNOSIS — L821 Other seborrheic keratosis: Secondary | ICD-10-CM | POA: Diagnosis not present

## 2019-06-07 ENCOUNTER — Other Ambulatory Visit: Payer: Self-pay

## 2019-06-07 ENCOUNTER — Ambulatory Visit: Payer: Medicare HMO

## 2019-06-07 ENCOUNTER — Ambulatory Visit (INDEPENDENT_AMBULATORY_CARE_PROVIDER_SITE_OTHER): Payer: Medicare HMO | Admitting: Family Medicine

## 2019-06-07 ENCOUNTER — Encounter: Payer: Self-pay | Admitting: Family Medicine

## 2019-06-07 DIAGNOSIS — Z8546 Personal history of malignant neoplasm of prostate: Secondary | ICD-10-CM | POA: Diagnosis not present

## 2019-06-07 DIAGNOSIS — H9190 Unspecified hearing loss, unspecified ear: Secondary | ICD-10-CM | POA: Insufficient documentation

## 2019-06-07 DIAGNOSIS — E119 Type 2 diabetes mellitus without complications: Secondary | ICD-10-CM | POA: Diagnosis not present

## 2019-06-07 DIAGNOSIS — I10 Essential (primary) hypertension: Secondary | ICD-10-CM

## 2019-06-07 DIAGNOSIS — H9193 Unspecified hearing loss, bilateral: Secondary | ICD-10-CM | POA: Diagnosis not present

## 2019-06-07 DIAGNOSIS — E785 Hyperlipidemia, unspecified: Secondary | ICD-10-CM | POA: Diagnosis not present

## 2019-06-07 DIAGNOSIS — W19XXXA Unspecified fall, initial encounter: Secondary | ICD-10-CM

## 2019-06-07 NOTE — Assessment & Plan Note (Signed)
Related to being on a ladder.  I discussed that he should absolutely not be on a ladder given his age and the fact that he is on a blood thinner.  He verbalized understanding.

## 2019-06-07 NOTE — Assessment & Plan Note (Signed)
Released by urology and oncology per patient report.  Given his age there is no role for monitoring.

## 2019-06-07 NOTE — Progress Notes (Signed)
Virtual Visit via telephone Note  This visit type was conducted due to national recommendations for restrictions regarding the COVID-19 pandemic (e.g. social distancing).  This format is felt to be most appropriate for this patient at this time.  All issues noted in this document were discussed and addressed.  No physical exam was performed (except for noted visual exam findings with Video Visits).   I connected with Devin Reese today at  9:30 AM EDT by telephone and verified that I am speaking with the correct person using two identifiers. Location patient: home Location provider: work Persons participating in the virtual visit: patient, provider, wife  I discussed the limitations, risks, security and privacy concerns of performing an evaluation and management service by telephone and the availability of in person appointments. I also discussed with the patient that there may be a patient responsible charge related to this service. The patient expressed understanding and agreed to proceed.  Interactive audio and video telecommunications were attempted between this provider and patient, however failed, due to patient having technical difficulties OR patient did not have access to video capability.  We continued and completed visit with audio only.  Reason for visit: follow-up  HPI: HYPERTENSION Disease Monitoring: Blood pressure range-129/62  Chest pain- no      Dyspnea- no Medications: Compliance- taking lisiopril   Edema- no  DIABETES Disease Monitoring: Blood Sugar ranges-131 today Polyuria/phagia/dipsia- no      Medications: Compliance- taking tradjenta    HYPERLIPIDEMIA Disease Monitoring: See symptoms for Hypertension Medications: Compliance- taking lipitor Right upper quadrant pain- no  Muscle aches- no  Fall: one time. Was on a ladder and shifted and fell off. Hit his chest on the ladder. Was sore for a couple days, though no significant injury. No head injury.    History of prostate cancer: Patient underwent radiation therapy in the 1990s.  His last PSA was collected in 2013 and was acceptable.  He and his wife both note that he was released by oncology and urology many years ago.  Difficulty hearing: This is a chronic issue.  He has hearing aids though he lost 1 of them.  He does report being evaluated for replacement though is going to cost too much money.   ROS: See pertinent positives and negatives per HPI.  Past Medical History:  Diagnosis Date  . Atrial flutter ([place])   . CAD (coronary artery disease)   . CKD (chronic kidney disease), stage II   . COPD (chronic obstructive pulmonary disease) ([place])   . Coronary artery disease    CABG in 1991 at [facility]. Most recent cardiac catheterization in 1610 was complicated by stroke.  . Diabetes mellitus without complication ([place])   . Hyperlipidemia   . Hypertension   . Prostate cancer ([place])   . Stroke [facility])     Past Surgical History:  Procedure Laterality Date  . CARDIAC CATHETERIZATION    . CARDIAC SURGERY    . CARDIOVERSION  03/29/13  . CORONARY ARTERY BYPASS GRAFT      Family History  Problem Relation Age of Onset  . Heart disease Father   . Coronary artery disease Father   . Cancer Other        family hx  . Coronary artery disease Other        family hx   . Diabetes Brother     SOCIAL HX: Former smoker   Current Outpatient Medications:  .  albuterol (PROVENTIL HFA;VENTOLIN HFA) 108 (90 Base) MCG/ACT inhaler, Inhale  1-2 puffs into the lungs every 4 (four) hours as needed for wheezing or shortness of breath., Disp: 1 Inhaler, Rfl: 2 .  atorvastatin (LIPITOR) 40 MG tablet, Take 1 tablet (40 mg total) by mouth daily., Disp: 90 tablet, Rfl: 0 .  ELIQUIS 2.5 MG TABS tablet, Take 1 tablet (2.5 mg total) by mouth 2 (two) times daily., Disp: 60 tablet, Rfl: 5 .  Fluticasone-Umeclidin-Vilant (TRELEGY ELLIPTA IN), Inhale 1 puff into the lungs daily., Disp: , Rfl:  .  levothyroxine (SYNTHROID)  25 MCG tablet, Take 1 tablet (25 mcg total) by mouth daily before breakfast., Disp: 90 tablet, Rfl: 0 .  linagliptin (TRADJENTA) 5 MG TABS tablet, Take 1 tablet (5 mg total) by mouth daily., Disp: 90 tablet, Rfl: 2 .  lisinopril (ZESTRIL) 5 MG tablet, Take 1 tablet (5 mg total) by mouth daily., Disp: 90 tablet, Rfl: 2 .  nitroGLYCERIN (NITROSTAT) 0.4 MG SL tablet, Place 1 tablet (0.4 mg total) under the tongue every 5 (five) minutes as needed for chest pain., Disp: 25 tablet, Rfl: 3 .  sertraline (ZOLOFT) 50 MG tablet, Take 50 mg by mouth daily., Disp: , Rfl:   EXAM: This was a telehealth telephone visit notes for physical exam was completed.  ASSESSMENT AND PLAN:  Discussed the following assessment and plan:  Hypertension Adequately controlled.  Continue current regimen.  Diabetes [facility]) He will come in to the office in 1 to 2 months for lab.  Continue Tradjenta.  Hyperlipidemia Continue Lipitor.  History of prostate cancer Released by urology and oncology per patient report.  Given his age there is no role for monitoring.  Difficulty hearing He has been evaluated for this.  I suggested reevaluation given that he lost 1 of his hearing aids.  He will think about this.  Fall Related to being on a ladder.  I discussed that he should absolutely not be on a ladder given his age and the fact that he is on a blood thinner.  He verbalized understanding.    I discussed the assessment and treatment plan with the patient. The patient was provided an opportunity to ask questions and all were answered. The patient agreed with the plan and demonstrated an understanding of the instructions.   The patient was advised to call back or seek an in-person evaluation if the symptoms worsen or if the condition fails to improve as anticipated.  I provided 15 minutes of non-face-to-face time during this encounter.   [name], MD

## 2019-06-07 NOTE — Assessment & Plan Note (Signed)
-  Continue Lipitor °

## 2019-06-07 NOTE — Assessment & Plan Note (Signed)
Adequately controlled.  Continue current regimen. 

## 2019-06-07 NOTE — Assessment & Plan Note (Signed)
He has been evaluated for this.  I suggested reevaluation given that he lost 1 of his hearing aids.  He will think about this.

## 2019-06-07 NOTE — Assessment & Plan Note (Signed)
He will come in to the office in 1 to 2 months for lab.  Continue Tradjenta.

## 2019-07-15 ENCOUNTER — Other Ambulatory Visit: Payer: Self-pay

## 2019-07-15 ENCOUNTER — Ambulatory Visit (INDEPENDENT_AMBULATORY_CARE_PROVIDER_SITE_OTHER): Payer: Medicare HMO

## 2019-07-15 VITALS — BP 108/52 | HR 59 | Temp 94.5°F

## 2019-07-15 DIAGNOSIS — Z0000 Encounter for general adult medical examination without abnormal findings: Secondary | ICD-10-CM

## 2019-07-15 NOTE — Progress Notes (Signed)
Subjective:   Devin Reese is a 85 y.o. male who presents for Medicare Annual/Subsequent preventive examination.  Review of Systems:  No ROS.  Medicare Wellness Virtual Visit.  Visual/audio telehealth visit. Vital signs provided by patient.  See social history for additional risk factors.   Cardiac Risk Factors include: advanced age (>56men, >78 women);male gender;hypertension;diabetes mellitus     Objective:    Vitals: BP (!) 108/52 (BP Location: Left Arm, Patient Position: Sitting, Cuff Size: Normal)   Pulse (!) 59   Temp (!) 94.5 F (34.7 C) (Oral)   There is no height or weight on file to calculate BMI.  Advanced Directives 07/15/2019 06/04/2018 11/20/2017 07/01/2017 07/01/2017  Does Patient Have a Medical Advance Directive? No No No No No  Would patient like information on creating a medical advance directive? No - Patient declined No - Patient declined No - Patient declined No - Patient declined -    Tobacco Social History   Tobacco Use  Smoking Status Former Smoker  . Packs/day: 1.00  . Years: 25.00  . Pack years: 25.00  . Types: Cigarettes  Smokeless Tobacco Never Used     Counseling given: Not Answered   Clinical Intake:  Pre-visit preparation completed: Yes           How often do you need to have someone help you when you read instructions, pamphlets, or other written materials from your doctor or pharmacy?: 1 - Never  Interpreter Needed?: No     Past Medical History:  Diagnosis Date  . Atrial flutter ([place])   . CAD (coronary artery disease)   . CKD (chronic kidney disease), stage II   . COPD (chronic obstructive pulmonary disease) ([place])   . Coronary artery disease    CABG in 1991 at [facility]. Most recent cardiac catheterization in 3785 was complicated by stroke.  . Diabetes mellitus without complication ([place])   . Hyperlipidemia   . Hypertension   . Prostate cancer ([place])   . Stroke [facility])    Past Surgical History:  Procedure Laterality  Date  . CARDIAC CATHETERIZATION    . CARDIAC SURGERY    . CARDIOVERSION  03/29/13  . CORONARY ARTERY BYPASS GRAFT     Family History  Problem Relation Age of Onset  . Heart disease Father   . Coronary artery disease Father   . Cancer Other        family hx  . Coronary artery disease Other        family hx   . Diabetes Brother   . Heart attack Brother    Social History   Socioeconomic History  . Marital status: Married    Spouse name: Not on file  . Number of children: Not on file  . Years of education: Not on file  . Highest education level: Not on file  Occupational History  . Not on file  Social Needs  . Financial resource strain: Not hard at all  . Food insecurity    Worry: Never true    Inability: Never true  . Transportation needs    Medical: No    Non-medical: No  Tobacco Use  . Smoking status: Former Smoker    Packs/day: 1.00    Years: 25.00    Pack years: 25.00    Types: Cigarettes  . Smokeless tobacco: Never Used  Substance and Sexual Activity  . Alcohol use: No    Comment: Rare  . Drug use: No  . Sexual activity: Not on  file  Lifestyle  . Physical activity    Days per week: Not on file    Minutes per session: Not on file  . Stress: Only a little  Relationships  . Social Herbalist on phone: Not on file    Gets together: Not on file    Attends religious service: Not on file    Active member of club or organization: Not on file    Attends meetings of clubs or organizations: Not on file    Relationship status: Not on file  Other Topics Concern  . Not on file  Social History Narrative   ** Merged History Encounter **       Retired, married, does not get regular exercise.     Outpatient Encounter Medications as of 07/15/2019  Medication Sig  . albuterol (PROVENTIL HFA;VENTOLIN HFA) 108 (90 Base) MCG/ACT inhaler Inhale 1-2 puffs into the lungs every 4 (four) hours as needed for wheezing or shortness of breath.  Arne [name] 2.5 MG TABS  tablet Take 1 tablet (2.5 mg total) by mouth 2 (two) times daily.  . Fluticasone-Umeclidin-Vilant (TRELEGY ELLIPTA IN) Inhale 1 puff into the lungs daily.  Marland Kitchen levothyroxine (SYNTHROID) 25 MCG tablet Take 1 tablet (25 mcg total) by mouth daily before breakfast.  . linagliptin (TRADJENTA) 5 MG TABS tablet Take 1 tablet (5 mg total) by mouth daily.  Marland Kitchen lisinopril (ZESTRIL) 5 MG tablet Take 1 tablet (5 mg total) by mouth daily.  . nitroGLYCERIN (NITROSTAT) 0.4 MG SL tablet Place 1 tablet (0.4 mg total) under the tongue every 5 (five) minutes as needed for chest pain.  Marland Kitchen atorvastatin (LIPITOR) 40 MG tablet Take 1 tablet (40 mg total) by mouth daily.  . sertraline (ZOLOFT) 50 MG tablet Take 50 mg by mouth daily.   No facility-administered encounter medications on file as of 07/15/2019.     Activities of Daily Living In your present state of health, do you have any difficulty performing the following activities: 07/15/2019  Hearing? Y  Comment Hearing aids, bilateral  Vision? N  Difficulty concentrating or making decisions? Y  Comment Age appropriate  Walking or climbing stairs? N  Dressing or bathing? N  Doing errands, shopping? N  Preparing Food and eating ? N  Using the Toilet? N  In the past six months, have you accidently leaked urine? N  Comment Followed by Dr. Candiss Norse  Do you have problems with loss of bowel control? N  Managing your Medications? N  Comment Wife assists as needed  Managing your Finances? N  Comment Wife assists as needed  Housekeeping or managing your Housekeeping? N  Some recent data might be hidden    Patient Care Team: Leone Haven, MD as PCP - General (Family Medicine) Wellington Hampshire, MD as PCP - Cardiology (Cardiology) Lorelee Market, MD (Family Medicine)   Assessment:   This is a routine wellness examination for Devin Reese.  I connected with patient 07/15/19 at 10:30 AM EDT by an audio enabled telemedicine application and verified that I am speaking  with the correct person using two identifiers. Patient stated full name and DOB. Patient gave permission to continue with virtual visit. Patient's location was at home and Nurse's location was at [place] office.   Health Maintenance Due: Influenza vaccine 2020- discussed; to be completed in season with doctor or local pharmacy.   Foot exam- followed by pcp Tdap- discussed; to be completed with doctor in visit or local pharmacy.  Update all pending  maintenance due as appropriate.   See completed HM at the end of note.   Eye: Visual acuity not assessed. Virtual visit. Wears readers only. Followed by their ophthalmologist every 12 months.   Dental: Dentures- yes  Hearing: Hearing aids- yes  Safety:  Patient feels safe at home- yes Patient does have smoke detectors at home- yes Patient does wear sunscreen or protective clothing when in direct sunlight - yes Patient does wear seat belt when in a moving vehicle - yes Patient drives- yes Adequate lighting in walkways free from debris- yes Grab bars and handrails used as appropriate- yes Ambulates with no assistive device  Social: Alcohol intake - no      Smoking history- former   Smokers in home? none Illicit drug use? none  Depression: PHQ 2 &9 complete. See screening below. Denies irritability, anhedonia, sadness/tearfullness.  Stable. No longer taking anti-depressant.  Falls: See screening below.    Medication: Taking as directed and without issues. No longer taking zoloft.  Covid-19: Precautions and sickness symptoms discussed. Wears mask, social distancing, hand hygiene as appropriate.   Activities of Daily Living Patient denies needing assistance with: household chores, feeding themselves, getting from bed to chair, getting to the toilet, bathing/showering, dressing, managing money, or preparing meals.   Memory: Patient is alert. Correctly identified the president of the [place] and season. Patient likes to read, build  in workshop and make pecan dishes for brain stimulation.  BMI- discussed the importance of a healthy diet, water intake and the benefits of aerobic exercise.  Educational material provided.  Physical activity- active around the home  Diet: regular Water: good intake  Advanced Directive: End of life planning; Advance aging; Advanced directives discussed.  Copy of current HCPOA/Living Will requested upon completion.     Other Providers Patient Care Team: Leone Haven, MD as PCP - General (Family Medicine) Wellington Hampshire, MD as PCP - Cardiology (Cardiology) Lorelee Market, MD (Family Medicine)  Exercise Activities and Dietary recommendations Current Exercise Habits: Home exercise routine, Intensity: Mild  Goals      Patient Stated   . DIET - INCREASE WATER INTAKE (pt-stated)     Drink up to 32 ounces daily       Fall Risk Fall Risk  06/07/2019 06/04/2018 11/20/2017 07/07/2017  Falls in the past year? 1 No No No  Number falls in past yr: 0 - - -  Injury with Fall? 0 - - -  Follow up Falls evaluation completed - - -   Timed Get Up and Go Performed: no, virtual visit  Depression Screen PHQ 2/9 Scores 07/15/2019 06/04/2018 11/20/2017 07/07/2017  PHQ - 2 Score 0 0 0 0    Cognitive Function     6CIT Screen 07/15/2019 06/04/2018  What Year? 0 points 0 points  What month? 0 points 0 points  What time? 0 points 0 points  Count back from 20 0 points 0 points  Months in reverse 4 points 0 points  Repeat phrase - 0 points  Total Score - 0    Immunization History  Administered Date(s) Administered  . Influenza, High Dose Seasonal PF 08/20/2017, 09/29/2018  . Pneumococcal Conjugate-13 02/01/2019  . Pneumococcal Polysaccharide-23 12/25/2015   Screening Tests Health Maintenance  Topic Date Due  . TETANUS/TDAP  11/22/1945  . FOOT EXAM  06/05/2019  . INFLUENZA VACCINE  02/16/2020 (Originally 06/19/2019)  . HEMOGLOBIN A1C  07/31/2019  . OPHTHALMOLOGY EXAM  06/01/2020  .  PNA vac Low Risk Adult  Completed  Plan:    Keep all routine maintenance appointments.  Next scheduled lab 08/12/19 @830 , flu shot 08/12/19 @ 900 Follow up 6 month 12/10/19  Medicare Attestation I have personally reviewed: The patient's medical and social history Their use of alcohol, tobacco or illicit drugs Their current medications and supplements The patient's functional ability including ADLs,fall risks, home safety risks, cognitive, and hearing and visual impairment Diet and physical activities Evidence for depression    In addition, I have reviewed and discussed with patient certain preventive protocols, quality metrics, and best practice recommendations. A written personalized care plan for preventive services as well as general preventive health recommendations were provided to patient via mail.     Varney Biles, LPN  01/05/2882

## 2019-07-15 NOTE — Patient Instructions (Addendum)
  [name] , Thank you for taking time to come for your Medicare Wellness Visit. I appreciate your ongoing commitment to your health goals. Please review the following plan we discussed and let me know if I can assist you in the future.   These are the goals we discussed: Goals      Patient Stated   . DIET - INCREASE WATER INTAKE (pt-stated)     Drink up to 32 ounces daily       This is a list of the screening recommended for you and due dates:  Health Maintenance  Topic Date Due  . Tetanus Vaccine  11/22/1945  . Complete foot exam   06/05/2019  . Flu Shot  02/16/2020*  . Hemoglobin A1C  07/31/2019  . Eye exam for diabetics  06/01/2020  . Pneumonia vaccines  Completed  *Topic was postponed. The date shown is not the original due date.

## 2019-07-16 NOTE — Progress Notes (Signed)
Not reviewed and I agree, will also forward to PCP  Philis Nettle FNP

## 2019-08-12 ENCOUNTER — Other Ambulatory Visit: Payer: Self-pay

## 2019-08-12 ENCOUNTER — Ambulatory Visit: Payer: Medicare HMO

## 2019-08-12 ENCOUNTER — Other Ambulatory Visit (INDEPENDENT_AMBULATORY_CARE_PROVIDER_SITE_OTHER): Payer: Medicare HMO

## 2019-08-12 DIAGNOSIS — E119 Type 2 diabetes mellitus without complications: Secondary | ICD-10-CM | POA: Diagnosis not present

## 2019-08-12 DIAGNOSIS — E785 Hyperlipidemia, unspecified: Secondary | ICD-10-CM

## 2019-08-12 DIAGNOSIS — Z23 Encounter for immunization: Secondary | ICD-10-CM

## 2019-08-12 LAB — COMPREHENSIVE METABOLIC PANEL
ALT: 9 U/L (ref 0–53)
AST: 17 U/L (ref 0–37)
Albumin: 3.8 g/dL (ref 3.5–5.2)
Alkaline Phosphatase: 89 U/L (ref 39–117)
BUN: 34 mg/dL — ABNORMAL HIGH (ref 6–23)
CO2: 26 mEq/L (ref 19–32)
Calcium: 9.3 mg/dL (ref 8.4–10.5)
Chloride: 103 mEq/L (ref 96–112)
Creatinine, Ser: 2.3 mg/dL — ABNORMAL HIGH (ref 0.40–1.50)
GFR: 26.8 mL/min — ABNORMAL LOW (ref >60.00)
Glucose, Bld: 118 mg/dL — ABNORMAL HIGH (ref 70–99)
Potassium: 5 mEq/L (ref 3.5–5.1)
Sodium: 137 mEq/L (ref 135–145)
Total Bilirubin: 0.7 mg/dL (ref 0.2–1.2)
Total Protein: 6.4 g/dL (ref 6.0–8.3)

## 2019-08-12 LAB — HEMOGLOBIN A1C: Hgb A1c MFr Bld: 7 % — ABNORMAL HIGH (ref 4.6–6.5)

## 2019-08-12 LAB — LIPID PANEL
Cholesterol: 130 mg/dL (ref 0–200)
HDL: 35.8 mg/dL — ABNORMAL LOW (ref >39.00)
LDL Cholesterol: 77 mg/dL (ref 0–99)
NonHDL: 94.47
Total CHOL/HDL Ratio: 4
Triglycerides: 86 mg/dL (ref 0.0–149.0)
VLDL: 17.2 mg/dL (ref 0.0–40.0)

## 2019-08-18 ENCOUNTER — Encounter: Payer: Self-pay | Admitting: *Deleted

## 2019-09-01 DIAGNOSIS — R600 Localized edema: Secondary | ICD-10-CM | POA: Diagnosis not present

## 2019-09-01 DIAGNOSIS — N1832 Chronic kidney disease, stage 3b: Secondary | ICD-10-CM | POA: Diagnosis not present

## 2019-09-01 DIAGNOSIS — N179 Acute kidney failure, unspecified: Secondary | ICD-10-CM | POA: Diagnosis not present

## 2019-09-01 DIAGNOSIS — I10 Essential (primary) hypertension: Secondary | ICD-10-CM | POA: Diagnosis not present

## 2019-09-01 DIAGNOSIS — E1122 Type 2 diabetes mellitus with diabetic chronic kidney disease: Secondary | ICD-10-CM | POA: Diagnosis not present

## 2019-09-02 DIAGNOSIS — R809 Proteinuria, unspecified: Secondary | ICD-10-CM | POA: Insufficient documentation

## 2019-09-02 DIAGNOSIS — R600 Localized edema: Secondary | ICD-10-CM | POA: Insufficient documentation

## 2019-09-24 ENCOUNTER — Ambulatory Visit (INDEPENDENT_AMBULATORY_CARE_PROVIDER_SITE_OTHER): Payer: Medicare HMO | Admitting: Cardiovascular Disease

## 2019-09-24 ENCOUNTER — Encounter: Payer: Self-pay | Admitting: Cardiovascular Disease

## 2019-09-24 ENCOUNTER — Other Ambulatory Visit: Payer: Self-pay

## 2019-09-24 VITALS — BP 118/50 | HR 58 | Ht 64.0 in | Wt 173.0 lb

## 2019-09-24 DIAGNOSIS — I10 Essential (primary) hypertension: Secondary | ICD-10-CM | POA: Diagnosis not present

## 2019-09-24 DIAGNOSIS — I4892 Unspecified atrial flutter: Secondary | ICD-10-CM | POA: Diagnosis not present

## 2019-09-24 DIAGNOSIS — I2510 Atherosclerotic heart disease of native coronary artery without angina pectoris: Secondary | ICD-10-CM | POA: Diagnosis not present

## 2019-09-24 DIAGNOSIS — E785 Hyperlipidemia, unspecified: Secondary | ICD-10-CM | POA: Diagnosis not present

## 2019-09-24 NOTE — Patient Instructions (Signed)

## 2019-09-24 NOTE — Progress Notes (Signed)
Cardiology Office Note   Date:  09/24/2019   ID:  Devin Reese, DOB 01-13-1927, MRN [id number]  PCP:  Leone Haven, MD  Cardiologist:   Kathlyn Sacramento, MD   Chief Complaint  Patient presents with   other    6 month follow up. patient c/o some SOB but he is staying active. Meds reviewed verbally with patient.       History of Present Illness: Devin Reese is a 84 y.o. male who presents for a followup visit  He has known history of coronary artery disease status post CABG in 1991 at [facility]. He was diagnosed with atrial flutter in 2014 with subsequent successful cardioversion. No recurrent arrhythmia since then.  Most recent echocardiogram in 2018 showed mildly reduced LV systolic function with an EF of 40 to 45% with inferior wall hypokinesis, mild mitral and aortic regurgitation. Carvedilol was discontinued recently due to bradycardia.  Lexiscan Myoview in August 2018 showed prior inferior scar with no significant ischemia.  He has been doing well with no recent chest pain, shortness of breath or palpitations.  No side effects so with anticoagulation.  He does have chronic kidney disease.  He was seen by Dr. Candiss Norse recently and was noted to have relatively low blood pressure.  His lisinopril was discontinued.   Past Medical History:  Diagnosis Date   Atrial flutter ([place])    CAD (coronary artery disease)    CKD (chronic kidney disease), stage II    COPD (chronic obstructive pulmonary disease) ([place])    Coronary artery disease    CABG in 1991 at [facility]. Most recent cardiac catheterization in 6378 was complicated by stroke.   Diabetes mellitus without complication ([place])    Hyperlipidemia    Hypertension    Prostate cancer ([place])    Stroke [facility])     Past Surgical History:  Procedure Laterality Date   CARDIAC CATHETERIZATION     CARDIAC SURGERY     CARDIOVERSION  03/29/13   CORONARY ARTERY BYPASS GRAFT       Current Outpatient Medications    Medication Sig Dispense Refill   albuterol (PROVENTIL HFA;VENTOLIN HFA) 108 (90 Base) MCG/ACT inhaler Inhale 1-2 puffs into the lungs every 4 (four) hours as needed for wheezing or shortness of breath. 1 Inhaler 2   ELIQUIS 2.5 MG TABS tablet Take 1 tablet (2.5 mg total) by mouth 2 (two) times daily. 60 tablet 5   Fluticasone-Umeclidin-Vilant (TRELEGY ELLIPTA IN) Inhale 1 puff into the lungs daily.     levothyroxine (SYNTHROID) 25 MCG tablet Take 1 tablet (25 mcg total) by mouth daily before breakfast. 90 tablet 0   linagliptin (TRADJENTA) 5 MG TABS tablet Take 1 tablet (5 mg total) by mouth daily. 90 tablet 2   nitroGLYCERIN (NITROSTAT) 0.4 MG SL tablet Place 1 tablet (0.4 mg total) under the tongue every 5 (five) minutes as needed for chest pain. 25 tablet 3   sertraline (ZOLOFT) 50 MG tablet Take 50 mg by mouth daily.     atorvastatin (LIPITOR) 40 MG tablet Take 1 tablet (40 mg total) by mouth daily. 90 tablet 0   No current facility-administered medications for this visit.     Allergies:   Patient has no known allergies.    Social History:  The patient  reports that he has quit smoking. His smoking use included cigarettes. He has a 25.00 pack-year smoking history. He has never used smokeless tobacco. He reports that he does not drink alcohol or use  drugs.   Family History:  The patient's family history includes Cancer in an other family member; Coronary artery disease in his father and another family member; Diabetes in his brother; Heart attack in his brother; Heart disease in his father.    ROS:  Please see the history of present illness.   Otherwise, review of systems are positive for none.   All other systems are reviewed and negative.    PHYSICAL EXAM: VS:  BP (!) 118/50 (BP Location: Left Arm, Patient Position: Sitting, Cuff Size: Normal)    Pulse (!) 58    Ht 5\' 4"  (1.626 m)    Wt 173 lb (78.5 kg)    BMI 29.70 kg/m  , BMI Body mass index is 29.7 kg/m. GEN: Well  nourished, well developed, in no acute distress  HEENT: normal  Neck: no JVD, carotid bruits, or masses Cardiac: RRR ; no murmurs, rubs, or gallops,no edema  Respiratory:  clear to auscultation bilaterally, normal work of breathing GI: soft, nontender, nondistended, + BS MS: no deformity or atrophy  Skin: warm and dry, no rash Neuro:  Strength and sensation are intact Psych: euthymic mood, full affect   EKG:  EKG is ordered today. The ekg ordered today demonstrates sinus bradycardia with possible old inferior infarct.  Recent Labs: 08/12/2019: ALT 9; BUN 34; Creatinine, Ser 2.30; Potassium 5.0; Sodium 137    Lipid Panel    Component Value Date/Time   CHOL 130 08/12/2019 0823   CHOL 143 03/20/2017 0826   TRIG 86.0 08/12/2019 0823   HDL 35.80 (L) 08/12/2019 0823   HDL 40 03/20/2017 0826   CHOLHDL 4 08/12/2019 0823   VLDL 17.2 08/12/2019 0823   LDLCALC 77 08/12/2019 0823   LDLCALC 84 03/20/2017 0826      Wt Readings from Last 3 Encounters:  09/24/19 173 lb (78.5 kg)  06/07/19 178 lb 12.8 oz (81.1 kg)  03/10/19 178 lb 12.8 oz (81.1 kg)         ASSESSMENT AND PLAN:  1.  Paroxysmal atrial flutter: No evidence of recurrent arrhythmia and heart rate seems to be controlled.   Continue long-term anticoagulation with low-dose Eliquis given age and chronic kidney disease.  2. Coronary artery disease involving native coronary arteries with stable angina:  He reports no anginal symptoms at the present time.   He is not on a beta-blocker due to chronic bradycardia.  3. Essential hypertension: Blood pressure is   controlled even after stopping lisinopril.  4. Hyperlipidemia: Continue atorvastatin 40 mg once daily.   Most recent lipid profile showed an LDL of 77.     Disposition:   FU with me in 6 months  Signed,  Kathlyn Sacramento, MD  09/24/2019 10:41 AM    [place]

## 2019-10-13 ENCOUNTER — Other Ambulatory Visit: Payer: Self-pay | Admitting: Family Medicine

## 2019-10-13 ENCOUNTER — Other Ambulatory Visit: Payer: Self-pay | Admitting: Cardiovascular Disease

## 2019-10-13 NOTE — Telephone Encounter (Signed)
Pt's age 85, wt 78.5 kg, SCr 2.3, CrCl 23.7, last ov w/ Dr. Fletcher Anon 09/24/19.

## 2019-10-13 NOTE — Telephone Encounter (Signed)
Refill request for Eliquis

## 2019-10-26 ENCOUNTER — Other Ambulatory Visit: Payer: Self-pay | Admitting: Cardiovascular Disease

## 2019-10-26 NOTE — Telephone Encounter (Signed)
Refill Request.  

## 2019-10-26 NOTE — Telephone Encounter (Signed)
Pt last saw Dr Fletcher Anon 09/24/19, last labs 08/12/19 Creat 2.30, age 85, weight 78.5kg, based on specified criteria pt is on appropriate dosage of Eliquis 2.5mg  BID.  Will refill rx.

## 2019-12-08 DIAGNOSIS — E1122 Type 2 diabetes mellitus with diabetic chronic kidney disease: Secondary | ICD-10-CM | POA: Diagnosis not present

## 2019-12-08 DIAGNOSIS — I10 Essential (primary) hypertension: Secondary | ICD-10-CM | POA: Diagnosis not present

## 2019-12-08 DIAGNOSIS — N184 Chronic kidney disease, stage 4 (severe): Secondary | ICD-10-CM | POA: Diagnosis not present

## 2019-12-08 DIAGNOSIS — R809 Proteinuria, unspecified: Secondary | ICD-10-CM | POA: Diagnosis not present

## 2019-12-10 ENCOUNTER — Encounter: Payer: Self-pay | Admitting: Family Medicine

## 2019-12-10 ENCOUNTER — Ambulatory Visit (INDEPENDENT_AMBULATORY_CARE_PROVIDER_SITE_OTHER): Payer: Medicare HMO | Admitting: Family Medicine

## 2019-12-10 ENCOUNTER — Ambulatory Visit: Payer: Medicare HMO | Attending: Internal Medicine

## 2019-12-10 ENCOUNTER — Other Ambulatory Visit: Payer: Self-pay

## 2019-12-10 VITALS — BP 112/65 | HR 59 | Temp 96.6°F | Ht 64.0 in | Wt 174.0 lb

## 2019-12-10 DIAGNOSIS — Z20822 Contact with and (suspected) exposure to covid-19: Secondary | ICD-10-CM | POA: Diagnosis not present

## 2019-12-10 DIAGNOSIS — E119 Type 2 diabetes mellitus without complications: Secondary | ICD-10-CM | POA: Diagnosis not present

## 2019-12-10 DIAGNOSIS — E039 Hypothyroidism, unspecified: Secondary | ICD-10-CM | POA: Diagnosis not present

## 2019-12-10 DIAGNOSIS — J989 Respiratory disorder, unspecified: Secondary | ICD-10-CM | POA: Diagnosis not present

## 2019-12-10 NOTE — Assessment & Plan Note (Signed)
Continue Synthroid °

## 2019-12-10 NOTE — Progress Notes (Signed)
Virtual Visit via telephone Note  This visit type was conducted due to national recommendations for restrictions regarding the COVID-19 pandemic (e.g. social distancing).  This format is felt to be most appropriate for this patient at this time.  All issues noted in this document were discussed and addressed.  No physical exam was performed (except for noted visual exam findings with Video Visits).   I connected with Devin Reese today at  1:15 PM EST by telephone and verified that I am speaking with the correct person using two identifiers. Location patient: home Location provider: work  Persons participating in the virtual visit: patient, provider, June Gravley (wife)  I discussed the limitations, risks, security and privacy concerns of performing an evaluation and management service by telephone and the availability of in person appointments. I also discussed with the patient that there may be a patient responsible charge related to this service. The patient expressed understanding and agreed to proceed.  Interactive audio and video telecommunications were attempted between this provider and patient, however failed, due to patient having technical difficulties OR patient did not have access to video capability.  We continued and completed visit with audio only.   Reason for visit: follow-up  HPI: DIABETES Disease Monitoring: Blood Sugar ranges-100-150, though has not checked in 3-4 days Polyuria/phagia/dipsia- no      Optho- UTD Medications: Compliance- taking tradjenta Hypoglycemic symptoms- no  HYPOTHYROIDISM Disease Monitoring Weight changes: relatively stable  Skin Changes: no Heat/Cold intolerance: no  Medication Monitoring Compliance:  Taking synthroid   Last TSH:   Lab Results  Component Value Date   TSH 1.19 08/20/2017   Respiratory illness: Patient and his wife both note that he has been coughing over the last week or 2.  He has some nasal and sinus congestion.  No  fever, chills, taste or smell loss, shortness of breath, or diarrhea.  No rhinorrhea.  He is worse in the morning.  No known COVID-19 exposure.  Cough drops resolve his symptoms.  ROS: See pertinent positives and negatives per HPI.  Past Medical History:  Diagnosis Date  . Atrial flutter ([place])   . CAD (coronary artery disease)   . CKD (chronic kidney disease), stage II   . COPD (chronic obstructive pulmonary disease) ([place])   . Coronary artery disease    CABG in 1991 at [facility]. Most recent cardiac catheterization in 6962 was complicated by stroke.  . Diabetes mellitus without complication ([place])   . Hyperlipidemia   . Hypertension   . Prostate cancer ([place])   . Stroke [facility])     Past Surgical History:  Procedure Laterality Date  . CARDIAC CATHETERIZATION    . CARDIAC SURGERY    . CARDIOVERSION  03/29/13  . CORONARY ARTERY BYPASS GRAFT      Family History  Problem Relation Age of Onset  . Heart disease Father   . Coronary artery disease Father   . Cancer Other        family hx  . Coronary artery disease Other        family hx   . Diabetes Brother   . Heart attack Brother     SOCIAL HX: Former smoker   Current Outpatient Medications:  .  albuterol (PROVENTIL HFA;VENTOLIN HFA) 108 (90 Base) MCG/ACT inhaler, Inhale 1-2 puffs into the lungs every 4 (four) hours as needed for wheezing or shortness of breath., Disp: 1 Inhaler, Rfl: 2 .  ELIQUIS 2.5 MG TABS tablet, Take 1 tablet (2.5 mg total) by  mouth 2 (two) times daily., Disp: 60 tablet, Rfl: 5 .  Fluticasone-Umeclidin-Vilant (TRELEGY ELLIPTA IN), Inhale 1 puff into the lungs daily., Disp: , Rfl:  .  levothyroxine (SYNTHROID) 25 MCG tablet, Take 1 tablet (25 mcg total) by mouth daily before breakfast., Disp: 90 tablet, Rfl: 0 .  linagliptin (TRADJENTA) 5 MG TABS tablet, Take 1 tablet (5 mg total) by mouth daily., Disp: 90 tablet, Rfl: 2 .  nitroGLYCERIN (NITROSTAT) 0.4 MG SL tablet, Place 1 tablet (0.4 mg total) under the tongue  every 5 (five) minutes as needed for chest pain., Disp: 25 tablet, Rfl: 3 .  sertraline (ZOLOFT) 50 MG tablet, Take 50 mg by mouth daily., Disp: , Rfl:  .  atorvastatin (LIPITOR) 40 MG tablet, Take 1 tablet (40 mg total) by mouth daily., Disp: 90 tablet, Rfl: 0  EXAM: This is a telehealth telephone visit and thus no physical exam was completed.  ASSESSMENT AND PLAN:  Discussed the following assessment and plan:  Respiratory illness Symptoms likely related to a viral illness and could be related to Covid.  Discussed having him tested and they were provided the phone number to call to schedule an appointment for testing.  Discussed continuing cough drops.  Discussed quarantine guidelines to last at least until testing results return.  Advised to seek medical attention if he becomes increasingly ill or develop shortness of breath.  Diabetes ([place]) Well-controlled.  Continue current regimen.  Hypothyroidism Continue Synthroid.   Orders Placed This Encounter  Procedures  . Novel Coronavirus, NAA ([facility])    Order Specific Question:   Is this test for diagnosis or screening    Answer:   Diagnosis of ill patient    Order Specific Question:   Symptomatic for COVID-19 as defined by CDC    Answer:   Yes    Order Specific Question:   Date of Symptom Onset    Answer:   12/03/2019    Order Specific Question:   Hospitalized for COVID-19    Answer:   No    Order Specific Question:   Admitted to ICU for COVID-19    Answer:   No    Order Specific Question:   Previously tested for COVID-19    Answer:   No    Order Specific Question:   Resident in a congregate (group) care setting    Answer:   No    Order Specific Question:   Is the patient student?    Answer:   No    Order Specific Question:   Employed in healthcare setting    Answer:   No    Order Specific Question:   Release to patient    Answer:   Immediate    No orders of the defined types were placed in this encounter.    I  discussed the assessment and treatment plan with the patient. The patient was provided an opportunity to ask questions and all were answered. The patient agreed with the plan and demonstrated an understanding of the instructions.   The patient was advised to call back or seek an in-person evaluation if the symptoms worsen or if the condition fails to improve as anticipated.  I provided 16 minutes of non-face-to-face time during this encounter.   [name], MD

## 2019-12-10 NOTE — Assessment & Plan Note (Signed)
Well-controlled.  Continue current regimen. 

## 2019-12-10 NOTE — Assessment & Plan Note (Signed)
Symptoms likely related to a viral illness and could be related to Covid.  Discussed having him tested and they were provided the phone number to call to schedule an appointment for testing.  Discussed continuing cough drops.  Discussed quarantine guidelines to last at least until testing results return.  Advised to seek medical attention if he becomes increasingly ill or develop shortness of breath.

## 2019-12-11 LAB — NOVEL CORONAVIRUS, NAA: SARS-CoV-2, NAA: NOT DETECTED

## 2019-12-16 ENCOUNTER — Other Ambulatory Visit: Payer: Self-pay | Admitting: Cardiovascular Disease

## 2019-12-24 DIAGNOSIS — E119 Type 2 diabetes mellitus without complications: Secondary | ICD-10-CM | POA: Diagnosis not present

## 2019-12-24 LAB — HM DIABETES EYE EXAM

## 2020-01-13 ENCOUNTER — Other Ambulatory Visit: Payer: Self-pay | Admitting: Family Medicine

## 2020-01-18 DIAGNOSIS — J449 Chronic obstructive pulmonary disease, unspecified: Secondary | ICD-10-CM | POA: Diagnosis not present

## 2020-01-18 DIAGNOSIS — R0600 Dyspnea, unspecified: Secondary | ICD-10-CM | POA: Diagnosis not present

## 2020-03-09 DIAGNOSIS — I129 Hypertensive chronic kidney disease with stage 1 through stage 4 chronic kidney disease, or unspecified chronic kidney disease: Secondary | ICD-10-CM | POA: Insufficient documentation

## 2020-03-09 DIAGNOSIS — N1832 Chronic kidney disease, stage 3b: Secondary | ICD-10-CM | POA: Diagnosis not present

## 2020-03-09 DIAGNOSIS — D631 Anemia in chronic kidney disease: Secondary | ICD-10-CM | POA: Insufficient documentation

## 2020-03-09 DIAGNOSIS — E1122 Type 2 diabetes mellitus with diabetic chronic kidney disease: Secondary | ICD-10-CM | POA: Diagnosis not present

## 2020-03-28 ENCOUNTER — Other Ambulatory Visit: Payer: Self-pay

## 2020-03-28 ENCOUNTER — Ambulatory Visit (INDEPENDENT_AMBULATORY_CARE_PROVIDER_SITE_OTHER): Payer: Medicare HMO | Admitting: Cardiovascular Disease

## 2020-03-28 ENCOUNTER — Encounter: Payer: Self-pay | Admitting: Cardiovascular Disease

## 2020-03-28 VITALS — BP 120/60 | HR 59 | Ht 65.0 in | Wt 177.2 lb

## 2020-03-28 DIAGNOSIS — I4892 Unspecified atrial flutter: Secondary | ICD-10-CM

## 2020-03-28 DIAGNOSIS — I10 Essential (primary) hypertension: Secondary | ICD-10-CM

## 2020-03-28 DIAGNOSIS — E785 Hyperlipidemia, unspecified: Secondary | ICD-10-CM | POA: Diagnosis not present

## 2020-03-28 DIAGNOSIS — I2510 Atherosclerotic heart disease of native coronary artery without angina pectoris: Secondary | ICD-10-CM | POA: Diagnosis not present

## 2020-03-28 MED ORDER — NITROGLYCERIN 0.4 MG SL SUBL: 0.4000 mg | SUBLINGUAL_TABLET | SUBLINGUAL | 3 refills | Status: DC | PRN

## 2020-03-28 NOTE — Progress Notes (Signed)
Cardiology Office Note   Date:  03/28/2020   ID:  Devin Reese, DOB 1927-03-20, MRN [id number]  PCP:  Devin Haven, MD  Cardiologist:   Devin Sacramento, MD   Chief Complaint  Patient presents with  . other    6 month follow up. Meds reviewed by the pt. verbally. "doing well."       History of Present Illness: Devin Reese is a 86 y.o. male who presents for a followup visit  He has known history of coronary artery disease status post CABG in 1991 at [facility]. He was diagnosed with atrial flutter in 2014 with subsequent successful cardioversion. No recurrent arrhythmia since then.  Most recent echocardiogram in 2018 showed mildly reduced LV systolic function with an EF of 40 to 45% with inferior wall hypokinesis, mild mitral and aortic regurgitation. Carvedilol was discontinued recently due to bradycardia.  Lexiscan Myoview in August 2018 showed prior inferior scar with no significant ischemia.  He has been doing very well with no recent chest pain, shortness of breath or palpitations.  He follows with Dr. Candiss Norse for chronic kidney disease.  Most recent creatinine from last month improved to 1.7.  No side effects with anticoagulation.  He took COVID-19 vaccine without issues.   Past Medical History:  Diagnosis Date  . Atrial flutter ([place])   . CAD (coronary artery disease)   . CKD (chronic kidney disease), stage II   . COPD (chronic obstructive pulmonary disease) ([place])   . Coronary artery disease    CABG in 1991 at [facility]. Most recent cardiac catheterization in 4193 was complicated by stroke.  . Diabetes mellitus without complication ([place])   . Hyperlipidemia   . Hypertension   . Prostate cancer ([place])   . Stroke [facility])     Past Surgical History:  Procedure Laterality Date  . CARDIAC CATHETERIZATION    . CARDIAC SURGERY    . CARDIOVERSION  03/29/13  . CORONARY ARTERY BYPASS GRAFT       Current Outpatient Medications  Medication Sig Dispense Refill  .  albuterol (PROVENTIL HFA;VENTOLIN HFA) 108 (90 Base) MCG/ACT inhaler Inhale 1-2 puffs into the lungs every 4 (four) hours as needed for wheezing or shortness of breath. 1 Inhaler 2  . atorvastatin (LIPITOR) 40 MG tablet Take 1 tablet (40 mg total) by mouth daily. 90 tablet 1  . ELIQUIS 2.5 MG TABS tablet Take 1 tablet (2.5 mg total) by mouth 2 (two) times daily. 60 tablet 5  . Fluticasone-Umeclidin-Vilant (TRELEGY ELLIPTA IN) Inhale 1 puff into the lungs daily.    [place] levothyroxine (SYNTHROID) 25 MCG tablet Take 1 tablet (25 mcg total) by mouth daily before breakfast. 90 tablet 0  . linagliptin (TRADJENTA) 5 MG TABS tablet Take 1 tablet (5 mg total) by mouth daily. 90 tablet 2  . nitroGLYCERIN (NITROSTAT) 0.4 MG SL tablet Place 1 tablet (0.4 mg total) under the tongue every 5 (five) minutes as needed for chest pain. 25 tablet 3  . sertraline (ZOLOFT) 50 MG tablet Take 50 mg by mouth daily.     No current facility-administered medications for this visit.    Allergies:   Patient has no known allergies.    Social History:  The patient  reports that he has quit smoking. His smoking use included cigarettes. He has a 25.00 pack-year smoking history. He has never used smokeless tobacco. He reports that he does not drink alcohol or use drugs.   Family History:  The patient's family history  includes Cancer in an other family member; Coronary artery disease in his father and another family member; Diabetes in his brother; Heart attack in his brother; Heart disease in his father.    ROS:  Please see the history of present illness.   Otherwise, review of systems are positive for none.   All other systems are reviewed and negative.    PHYSICAL EXAM: VS:  BP 120/60 (BP Location: Left Arm, Patient Position: Sitting, Cuff Size: Normal)   Pulse (!) 59   Ht 5\' 5"  (1.651 m)   Wt 177 lb 4 oz (80.4 kg)   SpO2 98%   BMI 29.50 kg/m  , BMI Body mass index is 29.5 kg/m. GEN: Well nourished, well developed, in  no acute distress  HEENT: normal  Neck: no JVD, carotid bruits, or masses Cardiac: RRR with premature beats; no murmurs, rubs, or gallops,no edema  Respiratory:  clear to auscultation bilaterally, normal work of breathing GI: soft, nontender, nondistended, + BS MS: no deformity or atrophy  Skin: warm and dry, no rash Neuro:  Strength and sensation are intact Psych: euthymic mood, full affect   EKG:  EKG is ordered today. The ekg ordered today demonstrates sinus rhythm with occasional PVC and possible old inferior infarct.  Recent Labs: 08/12/2019: ALT 9; BUN 34; Creatinine, Ser 2.30; Potassium 5.0; Sodium 137    Lipid Panel    Component Value Date/Time   CHOL 130 08/12/2019 0823   CHOL 143 03/20/2017 0826   TRIG 86.0 08/12/2019 0823   HDL 35.80 (L) 08/12/2019 0823   HDL 40 03/20/2017 0826   CHOLHDL 4 08/12/2019 0823   VLDL 17.2 08/12/2019 0823   LDLCALC 77 08/12/2019 0823   LDLCALC 84 03/20/2017 0826      Wt Readings from Last 3 Encounters:  03/28/20 177 lb 4 oz (80.4 kg)  12/10/19 174 lb (78.9 kg)  09/24/19 173 lb (78.5 kg)         ASSESSMENT AND PLAN:  1.  Paroxysmal atrial flutter: No evidence of recurrent arrhythmia and heart rate seems to be controlled.   Continue long-term anticoagulation with low-dose Eliquis given age and chronic kidney disease.  I reviewed his most recent labs from last month which showed a creatinine of 1.7 and stable hemoglobin of 11.9.  2. Coronary artery disease involving native coronary arteries with stable angina:  He reports no anginal symptoms at the present time.   He is not on a beta-blocker due to chronic bradycardia.  3. Essential hypertension: Blood pressure is controlled without medications.  4. Hyperlipidemia: Continue atorvastatin 40 mg once daily.   Most recent lipid profile showed an LDL of 77.     Disposition:   FU with me in 6 months  Signed,  Devin Sacramento, MD  03/28/2020 1:57 PM    [facility]

## 2020-03-28 NOTE — Patient Instructions (Signed)
Medication Instructions:  No changes  *If you need a refill on your cardiac medications before your next appointment, please call your pharmacy*   Lab Work: None  If you have labs (blood work) drawn today and your tests are completely normal, you will receive your results only by: . MyChart Message (if you have MyChart) OR . A paper copy in the mail If you have any lab test that is abnormal or we need to change your treatment, we will call you to review the results.   Testing/Procedures: None   Follow-Up: At [facility], you and your health needs are our priority.  As part of our continuing mission to provide you with exceptional heart care, we have created designated Provider Care Teams.  These Care Teams include your primary Cardiologist (physician) and Advanced Practice Providers (APPs -  Physician Assistants and Nurse Practitioners) who all work together to provide you with the care you need, when you need it.  We recommend signing up for the patient portal called "MyChart".  Sign up information is provided on this After Visit Summary.  MyChart is used to connect with patients for Virtual Visits (Telemedicine).  Patients are able to view lab/test results, encounter notes, upcoming appointments, etc.  Non-urgent messages can be sent to your provider as well.   To learn more about what you can do with MyChart, go to [email or web address].    Your next appointment:   6 month(s)  The format for your next appointment:   In Person  Provider:    You may see Muhammad Arida, MD or one of the following Advanced Practice Providers on your designated Care Team:    Christopher Berge, NP  Ryan Dunn, PA-C  Jacquelyn Visser, PA-C  

## 2020-04-05 ENCOUNTER — Other Ambulatory Visit: Payer: Self-pay | Admitting: Family Medicine

## 2020-04-09 ENCOUNTER — Other Ambulatory Visit: Payer: Self-pay

## 2020-04-09 ENCOUNTER — Emergency Department: Payer: Medicare HMO

## 2020-04-09 ENCOUNTER — Emergency Department
Admission: EM | Admit: 2020-04-09 | Discharge: 2020-04-09 | Disposition: A | Discharge status: Home / Self Care | Payer: Medicare HMO | Attending: Student | Admitting: Student

## 2020-04-09 DIAGNOSIS — Z7901 Long term (current) use of anticoagulants: Secondary | ICD-10-CM | POA: Diagnosis not present

## 2020-04-09 DIAGNOSIS — S50311A Abrasion of right elbow, initial encounter: Secondary | ICD-10-CM | POA: Insufficient documentation

## 2020-04-09 DIAGNOSIS — Y999 Unspecified external cause status: Secondary | ICD-10-CM | POA: Diagnosis not present

## 2020-04-09 DIAGNOSIS — E039 Hypothyroidism, unspecified: Secondary | ICD-10-CM | POA: Insufficient documentation

## 2020-04-09 DIAGNOSIS — Z79899 Other long term (current) drug therapy: Secondary | ICD-10-CM | POA: Diagnosis not present

## 2020-04-09 DIAGNOSIS — S199XXA Unspecified injury of neck, initial encounter: Secondary | ICD-10-CM | POA: Diagnosis not present

## 2020-04-09 DIAGNOSIS — E1122 Type 2 diabetes mellitus with diabetic chronic kidney disease: Secondary | ICD-10-CM | POA: Insufficient documentation

## 2020-04-09 DIAGNOSIS — S40021A Contusion of right upper arm, initial encounter: Secondary | ICD-10-CM | POA: Diagnosis not present

## 2020-04-09 DIAGNOSIS — Z951 Presence of aortocoronary bypass graft: Secondary | ICD-10-CM | POA: Insufficient documentation

## 2020-04-09 DIAGNOSIS — Y9389 Activity, other specified: Secondary | ICD-10-CM | POA: Diagnosis not present

## 2020-04-09 DIAGNOSIS — I129 Hypertensive chronic kidney disease with stage 1 through stage 4 chronic kidney disease, or unspecified chronic kidney disease: Secondary | ICD-10-CM | POA: Insufficient documentation

## 2020-04-09 DIAGNOSIS — Z23 Encounter for immunization: Secondary | ICD-10-CM | POA: Insufficient documentation

## 2020-04-09 DIAGNOSIS — J449 Chronic obstructive pulmonary disease, unspecified: Secondary | ICD-10-CM | POA: Insufficient documentation

## 2020-04-09 DIAGNOSIS — I2510 Atherosclerotic heart disease of native coronary artery without angina pectoris: Secondary | ICD-10-CM | POA: Diagnosis not present

## 2020-04-09 DIAGNOSIS — S4991XA Unspecified injury of right shoulder and upper arm, initial encounter: Secondary | ICD-10-CM | POA: Diagnosis not present

## 2020-04-09 DIAGNOSIS — Z8546 Personal history of malignant neoplasm of prostate: Secondary | ICD-10-CM | POA: Insufficient documentation

## 2020-04-09 DIAGNOSIS — N183 Chronic kidney disease, stage 3 unspecified: Secondary | ICD-10-CM | POA: Insufficient documentation

## 2020-04-09 DIAGNOSIS — W010XXA Fall on same level from slipping, tripping and stumbling without subsequent striking against object, initial encounter: Secondary | ICD-10-CM | POA: Insufficient documentation

## 2020-04-09 DIAGNOSIS — Z87891 Personal history of nicotine dependence: Secondary | ICD-10-CM | POA: Insufficient documentation

## 2020-04-09 DIAGNOSIS — S0990XA Unspecified injury of head, initial encounter: Secondary | ICD-10-CM | POA: Diagnosis not present

## 2020-04-09 DIAGNOSIS — M79601 Pain in right arm: Secondary | ICD-10-CM | POA: Diagnosis not present

## 2020-04-09 DIAGNOSIS — Y929 Unspecified place or not applicable: Secondary | ICD-10-CM | POA: Insufficient documentation

## 2020-04-09 IMAGING — CT CT HEAD W/O CM
3 series · 14 of 47 positions shown, 16 images · non-contrast
Comparison: None.

CLINICAL DATA: Recent fall

EXAM:
CT HEAD WITHOUT CONTRAST
CT CERVICAL SPINE WITHOUT CONTRAST
TECHNIQUE: Multidetector CT imaging of the head and cervical spine was
performed following the standard protocol without intravenous
contrast. Multiplanar CT image reconstructions of the cervical spine
were also generated.

[Series 3: head wo · axial · 0.47mm/px · z∈[-127,-2]mm · 8 of 30 slices shown, 10 images]
[im 3/30  brain]
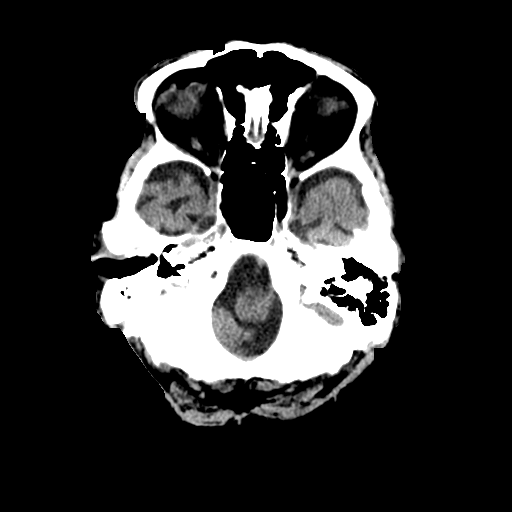
[im 3/30  bone]
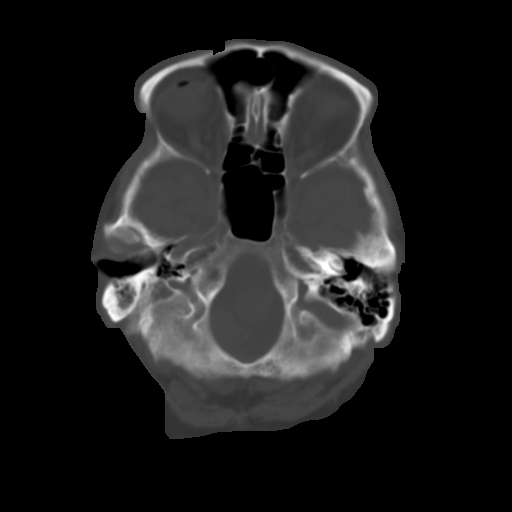
[im 7/30  brain]
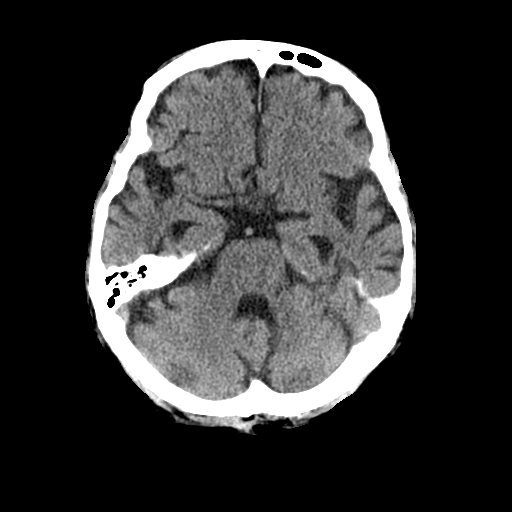
[im 10/30  brain]
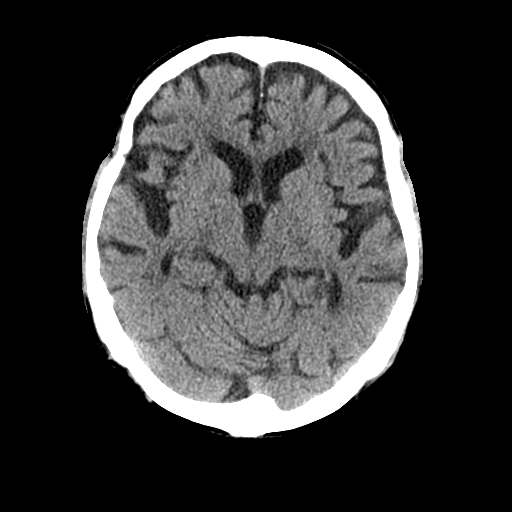
[im 14/30  brain]
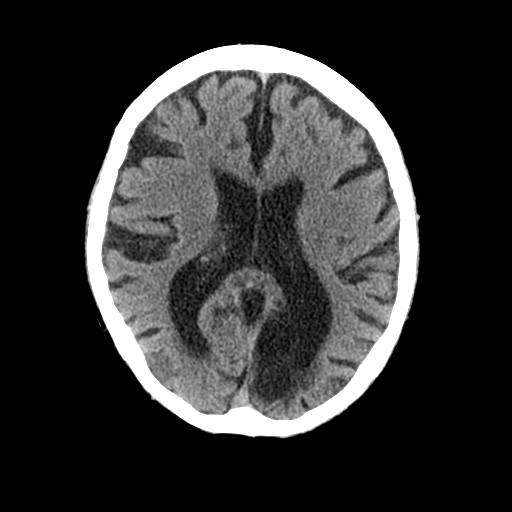
[im 17/30  brain]
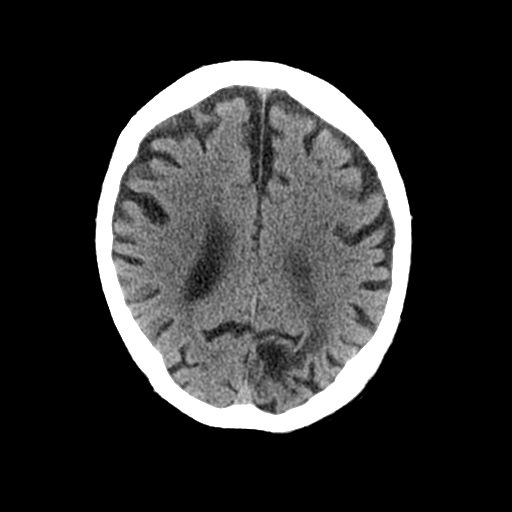
[im 17/30  bone]
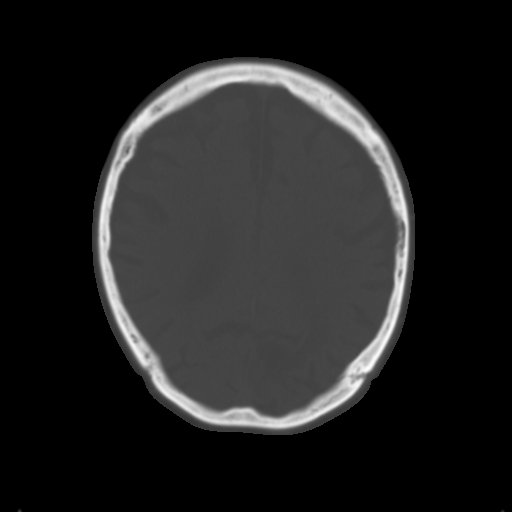
[im 21/30  brain]
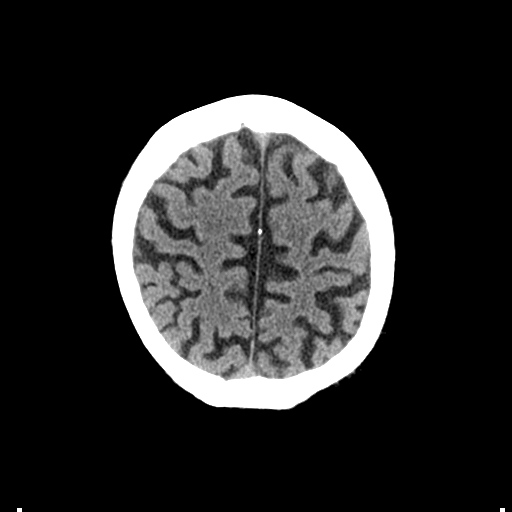
[im 24/30  brain]
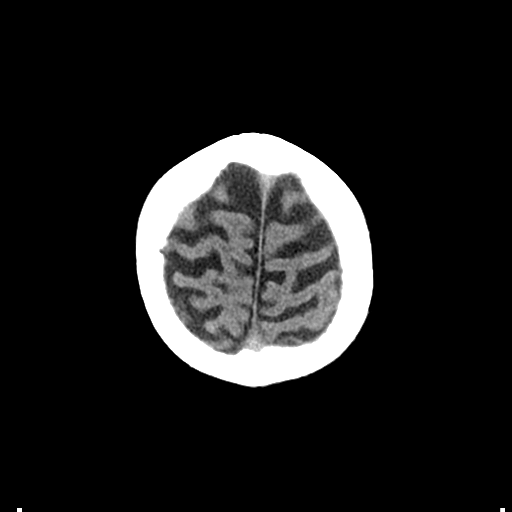
[im 28/30  brain]
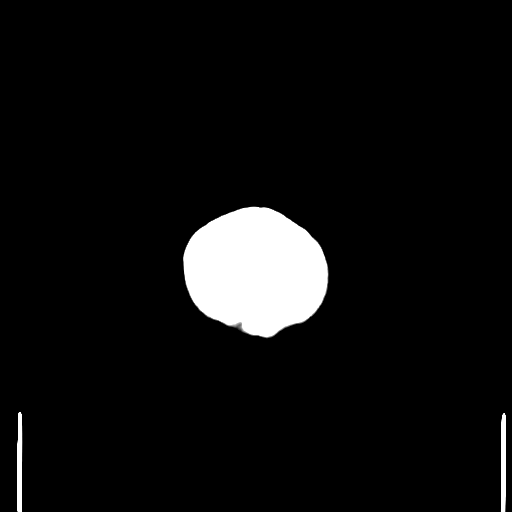

[Series 4: coronal soft tissue · coronal · 0.29mm/px · 3 of 67 slices shown]
[im 23/67  brain]
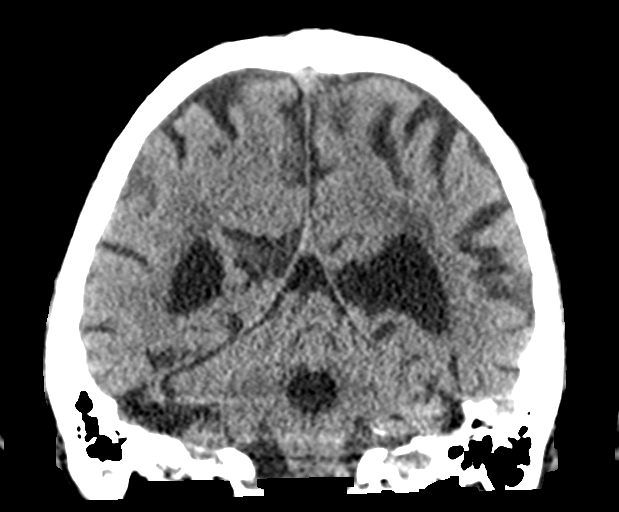
[im 30/67  brain]
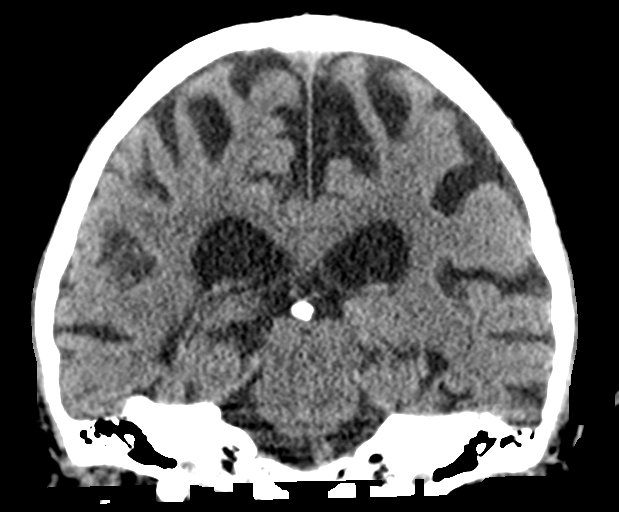
[im 37/67  brain]
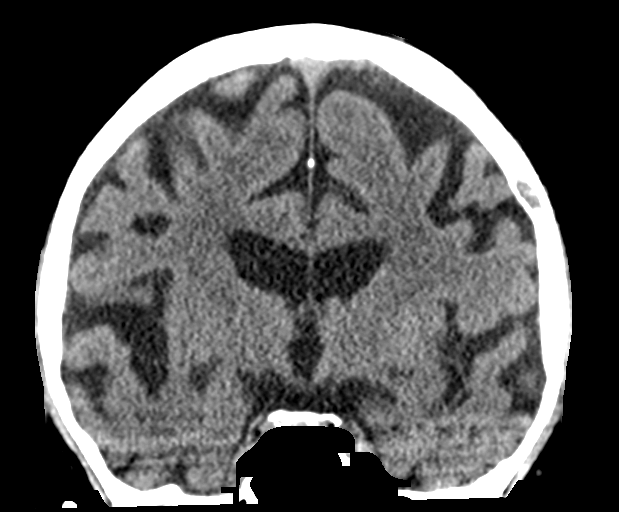

[Series 5: sagittal soft tissue · sagittal · 0.29mm/px · 3 of 61 slices shown]
[im 21/61  brain]
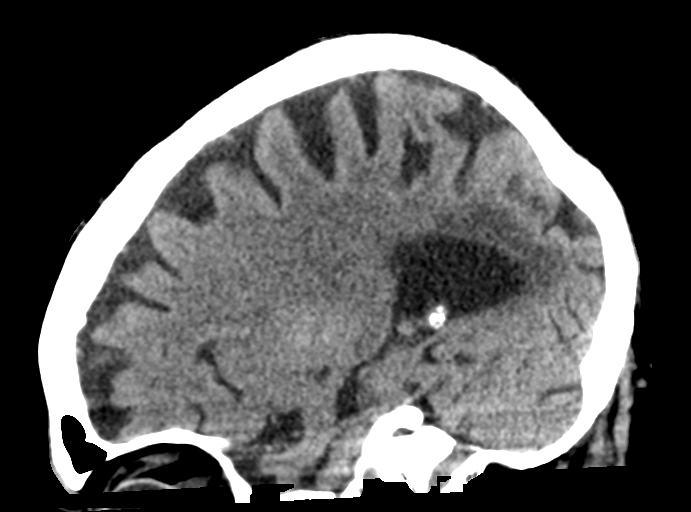
[im 31/61  brain]
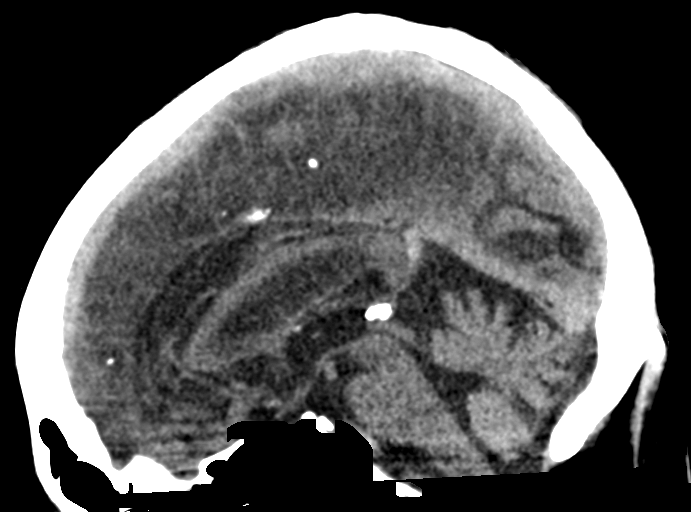
[im 41/61  brain]
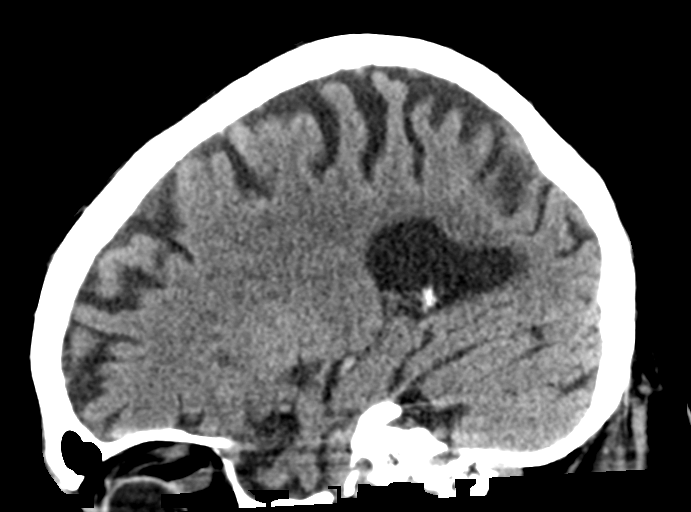

[14 of 47 positions shown; findings below may reference images not displayed]

FINDINGS: CT HEAD FINDINGS

Brain: Chronic atrophic changes are noted. Findings of prior left
occipital infarct are seen. No acute hemorrhage, acute infarction or
space-occupying mass lesion is noted.

Vascular: No hyperdense vessel or unexpected calcification.

Skull: Normal. Negative for fracture or focal lesion.

Sinuses/Orbits: No acute finding.

Other: None.

CT CERVICAL SPINE FINDINGS

Alignment: Within normal limits.

Skull base and vertebrae: 7 cervical segments are well visualized.
Vertebral body height is well maintained. Multilevel disc space
narrowing is noted with osteophytic change. Multilevel facet
hypertrophic changes are seen. No anterolisthesis is noted. The
odontoid is within normal limits. No acute fracture or acute facet
abnormality is noted.

Soft tissues and spinal canal: Surrounding soft tissue structures
are within normal limits.

Upper chest: Visualized lung apices are unremarkable.

Other: None
IMPRESSION: CT of the head: Chronic atrophic changes and prior infarct. No acute
abnormality noted.

CT of the cervical spine: Multilevel degenerative change without
acute abnormality.

## 2020-04-09 IMAGING — CT CT CERVICAL SPINE W/O CM
2 series · 13 of 27 positions shown, 16 images · non-contrast
Comparison: None.

CLINICAL DATA: Recent fall

EXAM:
CT HEAD WITHOUT CONTRAST
CT CERVICAL SPINE WITHOUT CONTRAST
TECHNIQUE: Multidetector CT imaging of the head and cervical spine was
performed following the standard protocol without intravenous
contrast. Multiplanar CT image reconstructions of the cervical spine
were also generated.

[Series 3: c spine soft · axial · 0.46mm/px · z∈[-268,-136]mm · 8 of 80 slices shown, 10 images]
[im 7/80  soft-tissue]
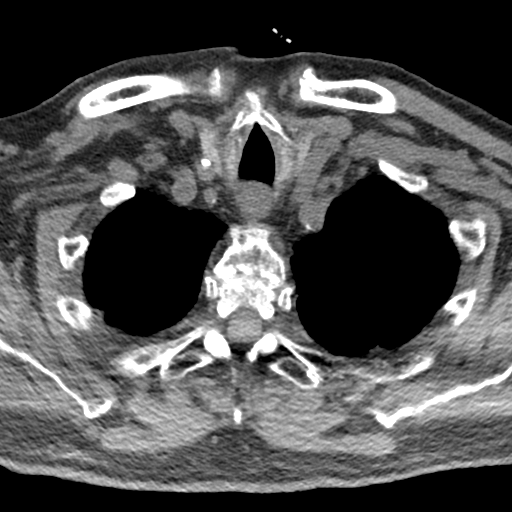
[im 7/80  bone]
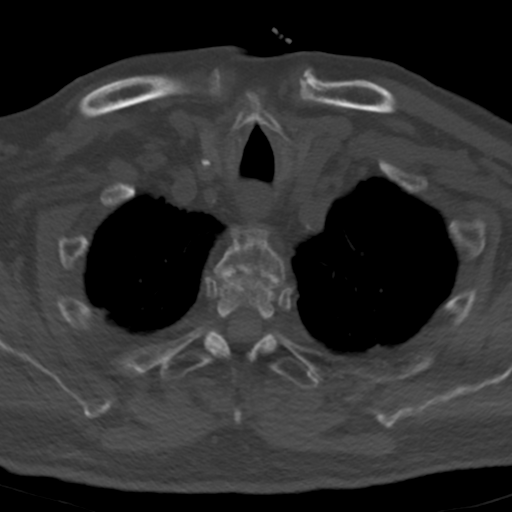
[im 19/80  bone]
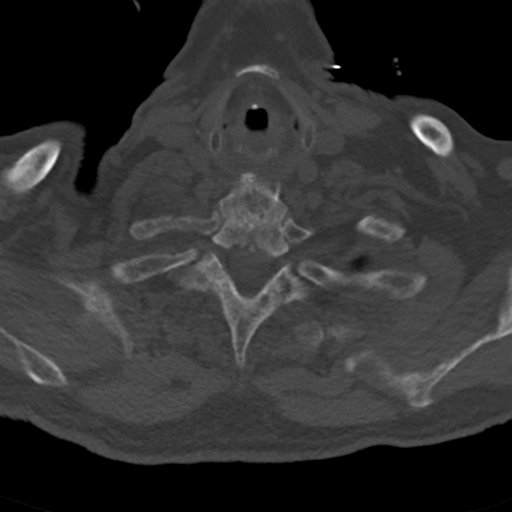
[im 25/80  bone]
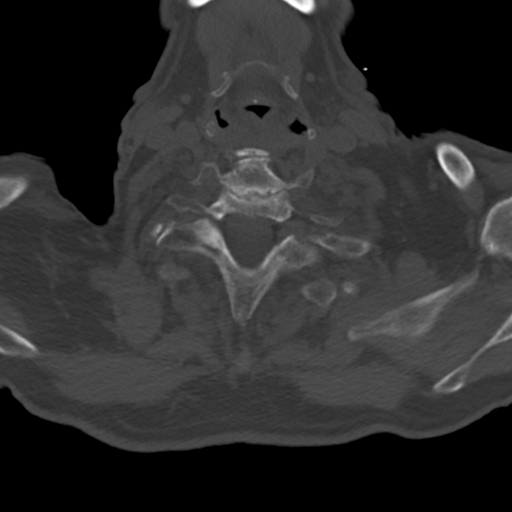
[im 37/80  bone]
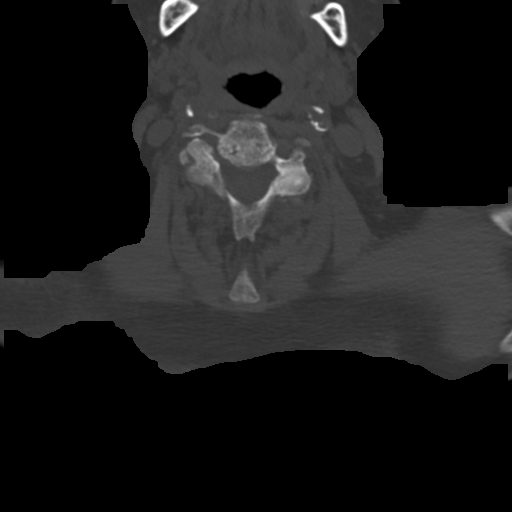
[im 43/80  soft-tissue]
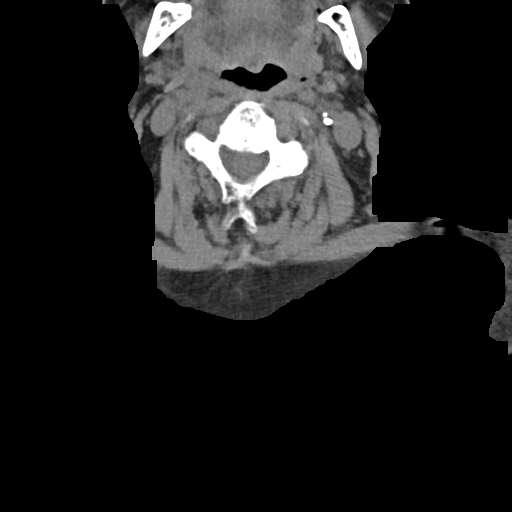
[im 43/80  bone]
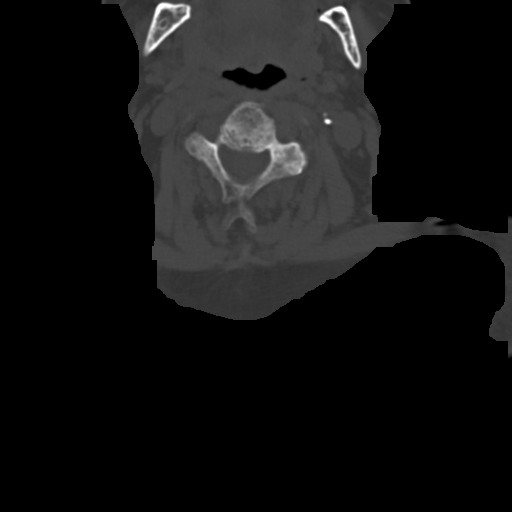
[im 55/80  bone]
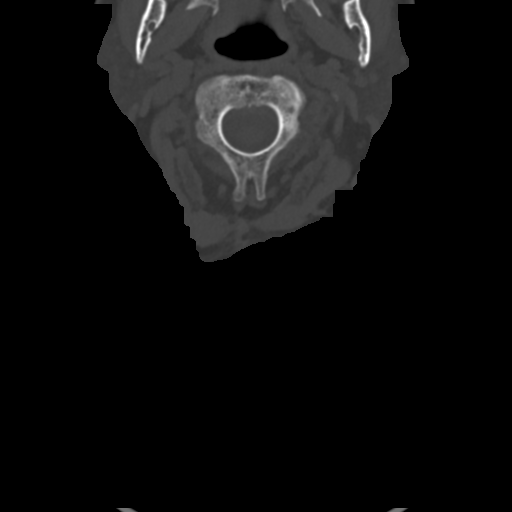
[im 61/80  bone]
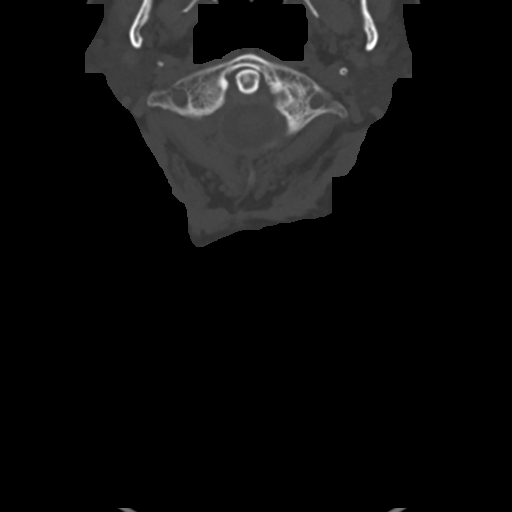
[im 73/80  bone]
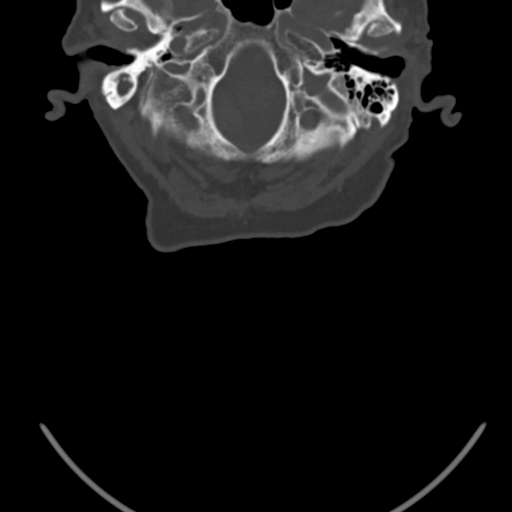

[Series 4: sagittal bone · sagittal · 0.26mm/px · 5 of 66 slices shown, 6 images]
[im 22/66  bone]
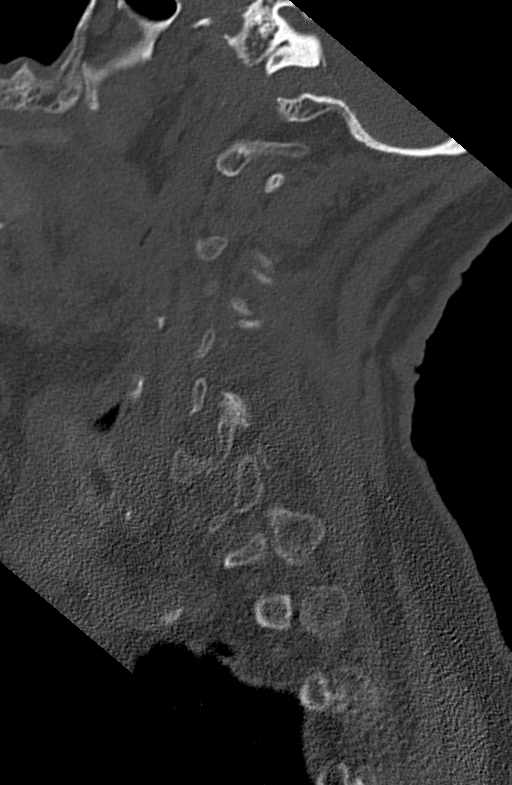
[im 28/66  bone]
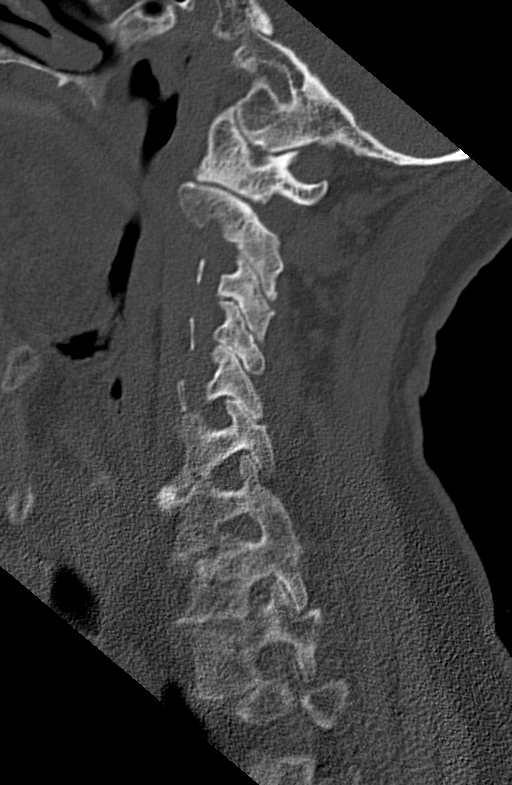
[im 33/66  soft-tissue]
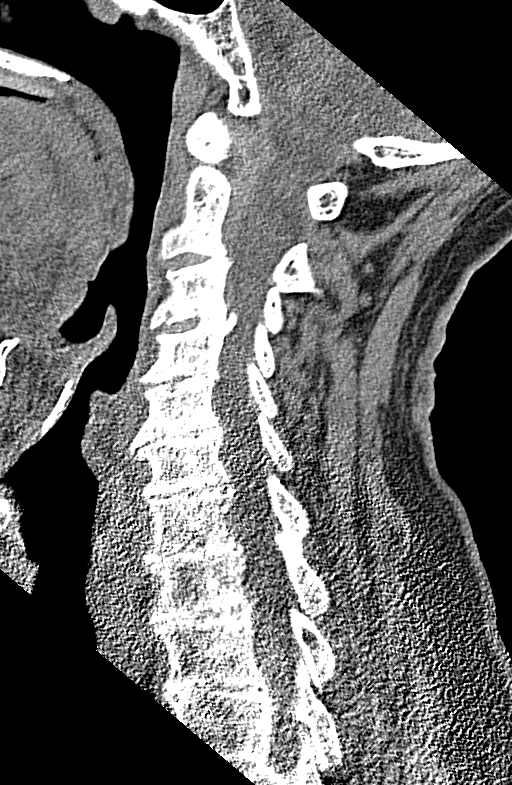
[im 33/66  bone]
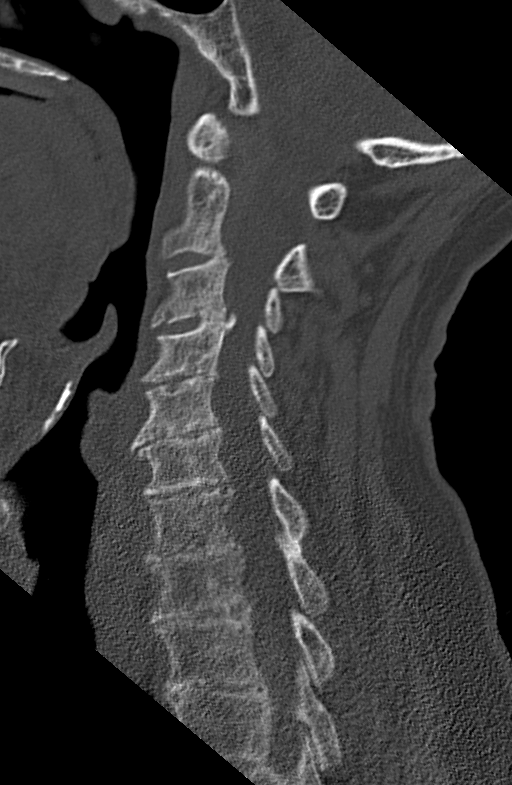
[im 38/66  bone]
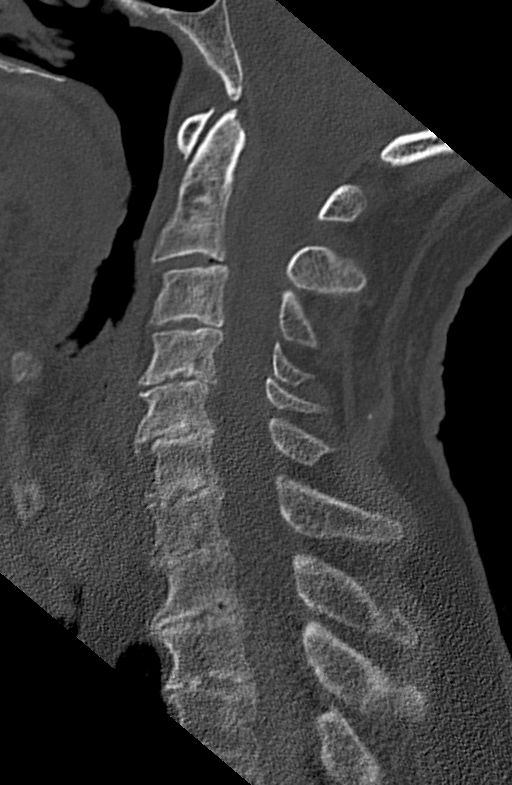
[im 44/66  bone]
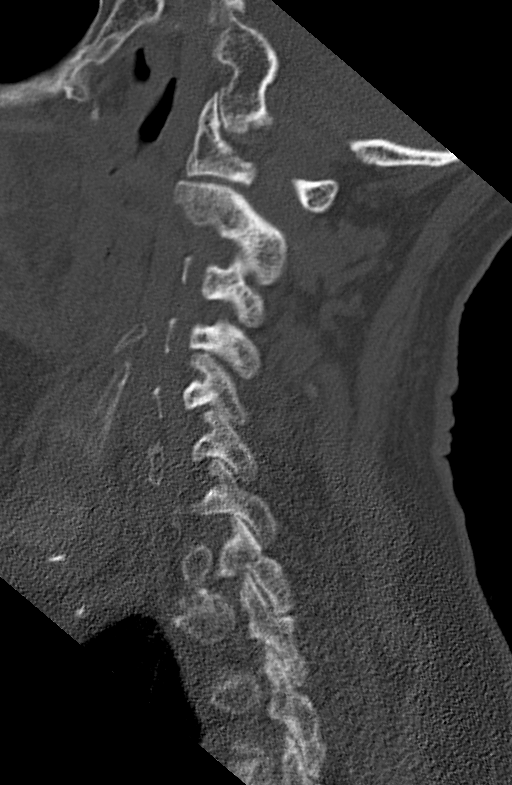

[13 of 27 positions shown; findings below may reference images not displayed]

FINDINGS: CT HEAD FINDINGS

Brain: Chronic atrophic changes are noted. Findings of prior left
occipital infarct are seen. No acute hemorrhage, acute infarction or
space-occupying mass lesion is noted.

Vascular: No hyperdense vessel or unexpected calcification.

Skull: Normal. Negative for fracture or focal lesion.

Sinuses/Orbits: No acute finding.

Other: None.

CT CERVICAL SPINE FINDINGS

Alignment: Within normal limits.

Skull base and vertebrae: 7 cervical segments are well visualized.
Vertebral body height is well maintained. Multilevel disc space
narrowing is noted with osteophytic change. Multilevel facet
hypertrophic changes are seen. No anterolisthesis is noted. The
odontoid is within normal limits. No acute fracture or acute facet
abnormality is noted.

Soft tissues and spinal canal: Surrounding soft tissue structures
are within normal limits.

Upper chest: Visualized lung apices are unremarkable.

Other: None
IMPRESSION: CT of the head: Chronic atrophic changes and prior infarct. No acute
abnormality noted.

CT of the cervical spine: Multilevel degenerative change without
acute abnormality.

## 2020-04-09 MED ORDER — TETANUS-DIPHTH-ACELL PERTUSSIS 5-2.5-18.5 LF-MCG/0.5 IM SUSP
0.5000 mL | Freq: Once | INTRAMUSCULAR | Status: AC
  Administered 2020-04-09: 0.5 mL via INTRAMUSCULAR
  Filled 2020-04-09: qty 0.5

## 2020-04-09 NOTE — ED Triage Notes (Signed)
Pt states that he was placing things in the trunk of the car and stumbled over his big feet and fell, pt states that he landed on his right arm, and is rubbing his humerus

## 2020-04-09 NOTE — Discharge Instructions (Addendum)
Follow-up with your primary care provider if any continued problems or concerns.  Also clean the abrasion to your elbow daily with mild soap and water and completely dry before covering it with a bandage.  Watch this area for any signs of infection.  You may take Tylenol if needed for pain.  Apply ice and elevate your elbow to reduce any swelling and help with pain.  X-rays and CT scan were negative in the ER.  You will also be extremely sore for approximately 4 to 5 days.

## 2020-04-09 NOTE — ED Provider Notes (Signed)
[facility] Emergency Department Provider Note  ____________________________________________   First MD Initiated Contact with Patient 04/09/20 1153     (approximate)  I have reviewed the triage vital signs and the nursing notes.   HISTORY  Chief Complaint Fall and Arm Pain   HPI Devin Reese is a 86 y.o. male presents to the ED with complaint of a fall that occurred today.  Patient states that he was placing something since that hit the trunk of his car when he stumbled over his feet and fell.  Patient denies any head injury or loss of consciousness and mostly it is his upper right arm that is hurting.  He is also unaware of the last time he had a tetanus booster.  Patient denies any headache, dizziness, chest pain, shortness of breath or changes in his vision.  He rates his pain as 10/10.       Past Medical History:  Diagnosis Date  . Atrial flutter ([place])   . CAD (coronary artery disease)   . CKD (chronic kidney disease), stage II   . COPD (chronic obstructive pulmonary disease) ([place])   . Coronary artery disease    CABG in 1991 at [facility]. Most recent cardiac catheterization in 5852 was complicated by stroke.  . Diabetes mellitus without complication ([place])   . Hyperlipidemia   . Hypertension   . Prostate cancer ([place])   . Stroke [facility])     Patient Active Problem List   Diagnosis Date Noted  . Respiratory illness 12/10/2019  . History of prostate cancer 06/07/2019  . Difficulty hearing 06/07/2019  . Fall 06/07/2019  . Chronic anticoagulation 06/04/2018  . AK (actinic keratosis) 12/02/2017  . COPD (chronic obstructive pulmonary disease) ([place]) 08/20/2017  . Hypothyroidism 08/20/2017  . Anisocoria 07/07/2017  . Memory difficulty 07/07/2017  . CKD (chronic kidney disease), stage III 07/02/2017  . Diabetes ([place]) 07/01/2017  . Atrial flutter ([place]) 02/25/2013  . Coronary artery disease   . Hypertension   . Hyperlipidemia   . OLD MYOCARDIAL  INFARCTION 02/20/2010  . CAD, ARTERY BYPASS GRAFT 09/14/2009    Past Surgical History:  Procedure Laterality Date  . CARDIAC CATHETERIZATION    . CARDIAC SURGERY    . CARDIOVERSION  03/29/13  . CORONARY ARTERY BYPASS GRAFT      Prior to Admission medications   Medication Sig Start Date End Date Taking? Authorizing Provider  albuterol (PROVENTIL HFA;VENTOLIN HFA) 108 (90 Base) MCG/ACT inhaler Inhale 1-2 puffs into the lungs every 4 (four) hours as needed for wheezing or shortness of breath. 08/20/17   Leone Haven, MD  atorvastatin (LIPITOR) 40 MG tablet Take 1 tablet (40 mg total) by mouth daily. 12/16/19 03/28/20  Wellington Hampshire, MD  ELIQUIS 2.5 MG TABS tablet Take 1 tablet (2.5 mg total) by mouth 2 (two) times daily. 10/26/19   Wellington Hampshire, MD  Fluticasone-Umeclidin-Vilant (TRELEGY ELLIPTA IN) Inhale 1 puff into the lungs daily.    [provider]  levothyroxine (SYNTHROID) 25 MCG tablet Take 1 tablet (25 mcg total) by mouth daily before breakfast. 04/05/20   Leone Haven, MD  linagliptin (TRADJENTA) 5 MG TABS tablet Take 1 tablet (5 mg total) by mouth daily. 03/10/19   Leone Haven, MD  nitroGLYCERIN (NITROSTAT) 0.4 MG SL tablet Place 1 tablet (0.4 mg total) under the tongue every 5 (five) minutes as needed for chest pain. 03/28/20   Wellington Hampshire, MD  sertraline (ZOLOFT) 50 MG tablet Take 50  mg by mouth daily.    [provider]    Allergies Patient has no known allergies.  Family History  Problem Relation Age of Onset  . Heart disease Father   . Coronary artery disease Father   . Cancer Other        family hx  . Coronary artery disease Other        family hx   . Diabetes Brother   . Heart attack Brother     Social History Social History   Tobacco Use  . Smoking status: Former Smoker    Packs/day: 1.00    Years: 25.00    Pack years: 25.00    Types: Cigarettes  . Smokeless tobacco: Never Used  Substance Use Topics  .  Alcohol use: No    Comment: Rare  . Drug use: No    Review of Systems Constitutional: No fever/chills Eyes: No visual changes. ENT: No trauma. Cardiovascular: Denies chest pain. Respiratory: Denies shortness of breath. Gastrointestinal: No abdominal pain.  No nausea, no vomiting.  Musculoskeletal: Positive for right upper arm pain, right elbow pain. Skin: Positive abrasion right elbow. Neurological: Negative for headaches, focal weakness or numbness. ____________________________________________   PHYSICAL EXAM:  VITAL SIGNS: ED Triage Vitals [04/09/20 0938]  Enc Vitals Group     BP 123/64     Pulse Rate 60     Resp 16     Temp (!) 97.4 F (36.3 C)     Temp Source Oral     SpO2 98 %     Weight 175 lb (79.4 kg)     Height 5\' 10"  (1.778 m)     Head Circumference      Peak Flow      Pain Score 10     Pain Loc      Pain Edu?      Excl. in [place]?    Constitutional: Alert and oriented. Well appearing and in no acute distress.  Patient is with daughter but is cooperative and answers questions appropriately. Eyes: Conjunctivae are normal. PERRL. EOMI. Head: Atraumatic. Nose: No trauma. Neck: No stridor.  No cervical tenderness on palpation posteriorly.  No difficulty with range of motion. Cardiovascular: Normal rate, regular rhythm. Grossly normal heart sounds.  Good peripheral circulation. Respiratory: Normal respiratory effort.  No retractions. Lungs CTAB. Gastrointestinal: Soft and nontender. Musculoskeletal: Nontender to palpation thoracic and lumbar spine, no step-offs were noted.  Patient is able to move right upper extremity without limitations.  Patient is nontender on palpation of the clavicle or posteriorly.  There is no soft tissue injury noted.  Patient is able to adduct and abduct his upper arm.  There is no deformity noted of his right elbow however there is an abrasion without active bleeding or foreign body noted.  Patient is able to flex, extend and rotate his  right forearm without restriction.  Nontender lower extremities to palpation. Neurologic:  Normal speech and language. No gross focal neurologic deficits are appreciated.  Skin:  Skin is warm, dry and intact. No rash noted. Psychiatric: Mood and affect are normal. Speech and behavior are normal.  ____________________________________________   LABS (all labs ordered are listed, but only abnormal results are displayed)  Labs Reviewed - No data to display   RADIOLOGY  Official radiology report(s): CT Head Wo Contrast  Result Date: 04/09/2020 CLINICAL DATA:  Recent fall EXAM: CT HEAD WITHOUT CONTRAST CT CERVICAL SPINE WITHOUT CONTRAST TECHNIQUE: Multidetector CT imaging of the head and cervical spine was  performed following the standard protocol without intravenous contrast. Multiplanar CT image reconstructions of the cervical spine were also generated. COMPARISON:  None. FINDINGS: CT HEAD FINDINGS Brain: Chronic atrophic changes are noted. Findings of prior left occipital infarct are seen. No acute hemorrhage, acute infarction or space-occupying mass lesion is noted. Vascular: No hyperdense vessel or unexpected calcification. Skull: Normal. Negative for fracture or focal lesion. Sinuses/Orbits: No acute finding. Other: None. CT CERVICAL SPINE FINDINGS Alignment: Within normal limits. Skull base and vertebrae: 7 cervical segments are well visualized. Vertebral body height is well maintained. Multilevel disc space narrowing is noted with osteophytic change. Multilevel facet hypertrophic changes are seen. No anterolisthesis is noted. The odontoid is within normal limits. No acute fracture or acute facet abnormality is noted. Soft tissues and spinal canal: Surrounding soft tissue structures are within normal limits. Upper chest: Visualized lung apices are unremarkable. Other: None IMPRESSION: CT of the head: Chronic atrophic changes and prior infarct. No acute abnormality noted. CT of the cervical spine:  Multilevel degenerative change without acute abnormality. Electronically Signed   By: Inez Catalina M.D.   On: 04/09/2020 12:03   CT Cervical Spine Wo Contrast  Result Date: 04/09/2020 CLINICAL DATA:  Recent fall EXAM: CT HEAD WITHOUT CONTRAST CT CERVICAL SPINE WITHOUT CONTRAST TECHNIQUE: Multidetector CT imaging of the head and cervical spine was performed following the standard protocol without intravenous contrast. Multiplanar CT image reconstructions of the cervical spine were also generated. COMPARISON:  None. FINDINGS: CT HEAD FINDINGS Brain: Chronic atrophic changes are noted. Findings of prior left occipital infarct are seen. No acute hemorrhage, acute infarction or space-occupying mass lesion is noted. Vascular: No hyperdense vessel or unexpected calcification. Skull: Normal. Negative for fracture or focal lesion. Sinuses/Orbits: No acute finding. Other: None. CT CERVICAL SPINE FINDINGS Alignment: Within normal limits. Skull base and vertebrae: 7 cervical segments are well visualized. Vertebral body height is well maintained. Multilevel disc space narrowing is noted with osteophytic change. Multilevel facet hypertrophic changes are seen. No anterolisthesis is noted. The odontoid is within normal limits. No acute fracture or acute facet abnormality is noted. Soft tissues and spinal canal: Surrounding soft tissue structures are within normal limits. Upper chest: Visualized lung apices are unremarkable. Other: None IMPRESSION: CT of the head: Chronic atrophic changes and prior infarct. No acute abnormality noted. CT of the cervical spine: Multilevel degenerative change without acute abnormality. Electronically Signed   By: Inez Catalina M.D.   On: 04/09/2020 12:03   DG Humerus Right  Result Date: 04/09/2020 CLINICAL DATA:  Recent fall with right arm pain, initial encounter EXAM: RIGHT HUMERUS - 2+ VIEW COMPARISON:  None. FINDINGS: There is no evidence of fracture or other focal bone lesions. Soft tissues  are unremarkable. IMPRESSION: No acute abnormality noted. Electronically Signed   By: Inez Catalina M.D.   On: 04/09/2020 10:23    ____________________________________________   PROCEDURES  Procedure(s) performed (including Critical Care):  Procedures   ____________________________________________   INITIAL IMPRESSION / ASSESSMENT AND PLAN / ED COURSE  As part of my medical decision making, I reviewed the following data within the electronic MEDICAL RECORD NUMBER Notes from prior ED visits and [place] Controlled Substance Database  86 year old male presents to the ED after a fall that occurred when he was placing some items in the trunk of his car and tripped over his feet.  Patient denies any LOC or head injury.  He complains of his right upper arm and states he has an abrasion to his elbow.  CT head and cervical spine were negative for any acute changes.  Right humerus was negative for fracture.  Patient and family were made aware that they would need to clean the abrasion daily with mild soap and water and watch for any signs of infection.  Tetanus booster was updated.  Patient is to follow-up with his PCP if any continued problems or return to the emergency department if any severe worsening of his symptoms. ____________________________________________   FINAL CLINICAL IMPRESSION(S) / ED DIAGNOSES  Final diagnoses:  Contusion of right upper extremity, initial encounter  Abrasion of right elbow, initial encounter     ED Discharge Orders    None       Note:  This document was prepared using Dragon voice recognition software and may include unintentional dictation errors.    Johnn Hai, PA-C 04/09/20 1557    Lilia Pro., MD 04/09/20 [phone]

## 2020-04-21 DIAGNOSIS — D2271 Melanocytic nevi of right lower limb, including hip: Secondary | ICD-10-CM | POA: Diagnosis not present

## 2020-04-21 DIAGNOSIS — D2261 Melanocytic nevi of right upper limb, including shoulder: Secondary | ICD-10-CM | POA: Diagnosis not present

## 2020-04-21 DIAGNOSIS — X32XXXA Exposure to sunlight, initial encounter: Secondary | ICD-10-CM | POA: Diagnosis not present

## 2020-04-21 DIAGNOSIS — D225 Melanocytic nevi of trunk: Secondary | ICD-10-CM | POA: Diagnosis not present

## 2020-04-21 DIAGNOSIS — D2272 Melanocytic nevi of left lower limb, including hip: Secondary | ICD-10-CM | POA: Diagnosis not present

## 2020-04-21 DIAGNOSIS — L570 Actinic keratosis: Secondary | ICD-10-CM | POA: Diagnosis not present

## 2020-04-21 DIAGNOSIS — D2262 Melanocytic nevi of left upper limb, including shoulder: Secondary | ICD-10-CM | POA: Diagnosis not present

## 2020-04-21 DIAGNOSIS — L821 Other seborrheic keratosis: Secondary | ICD-10-CM | POA: Diagnosis not present

## 2020-04-25 ENCOUNTER — Ambulatory Visit (INDEPENDENT_AMBULATORY_CARE_PROVIDER_SITE_OTHER): Payer: Medicare HMO

## 2020-04-25 ENCOUNTER — Other Ambulatory Visit: Payer: Self-pay

## 2020-04-25 ENCOUNTER — Encounter: Payer: Self-pay | Admitting: Nurse Practitioner

## 2020-04-25 ENCOUNTER — Ambulatory Visit (INDEPENDENT_AMBULATORY_CARE_PROVIDER_SITE_OTHER): Payer: Medicare HMO | Admitting: Nurse Practitioner

## 2020-04-25 VITALS — BP 110/54 | HR 67 | Temp 97.6°F | Ht 70.0 in | Wt 174.0 lb

## 2020-04-25 DIAGNOSIS — M25511 Pain in right shoulder: Secondary | ICD-10-CM | POA: Diagnosis not present

## 2020-04-25 DIAGNOSIS — Z7901 Long term (current) use of anticoagulants: Secondary | ICD-10-CM | POA: Diagnosis not present

## 2020-04-25 IMAGING — DX DG SHOULDER 2+V*R*
3 series · 3 of 3 positions shown · non-contrast
Comparison: [DATE] right humerus radiographs

CLINICAL DATA: Right shoulder pain after fall

EXAM:
RIGHT SHOULDER - 2+ VIEW

[shoulder internal rotation ap]
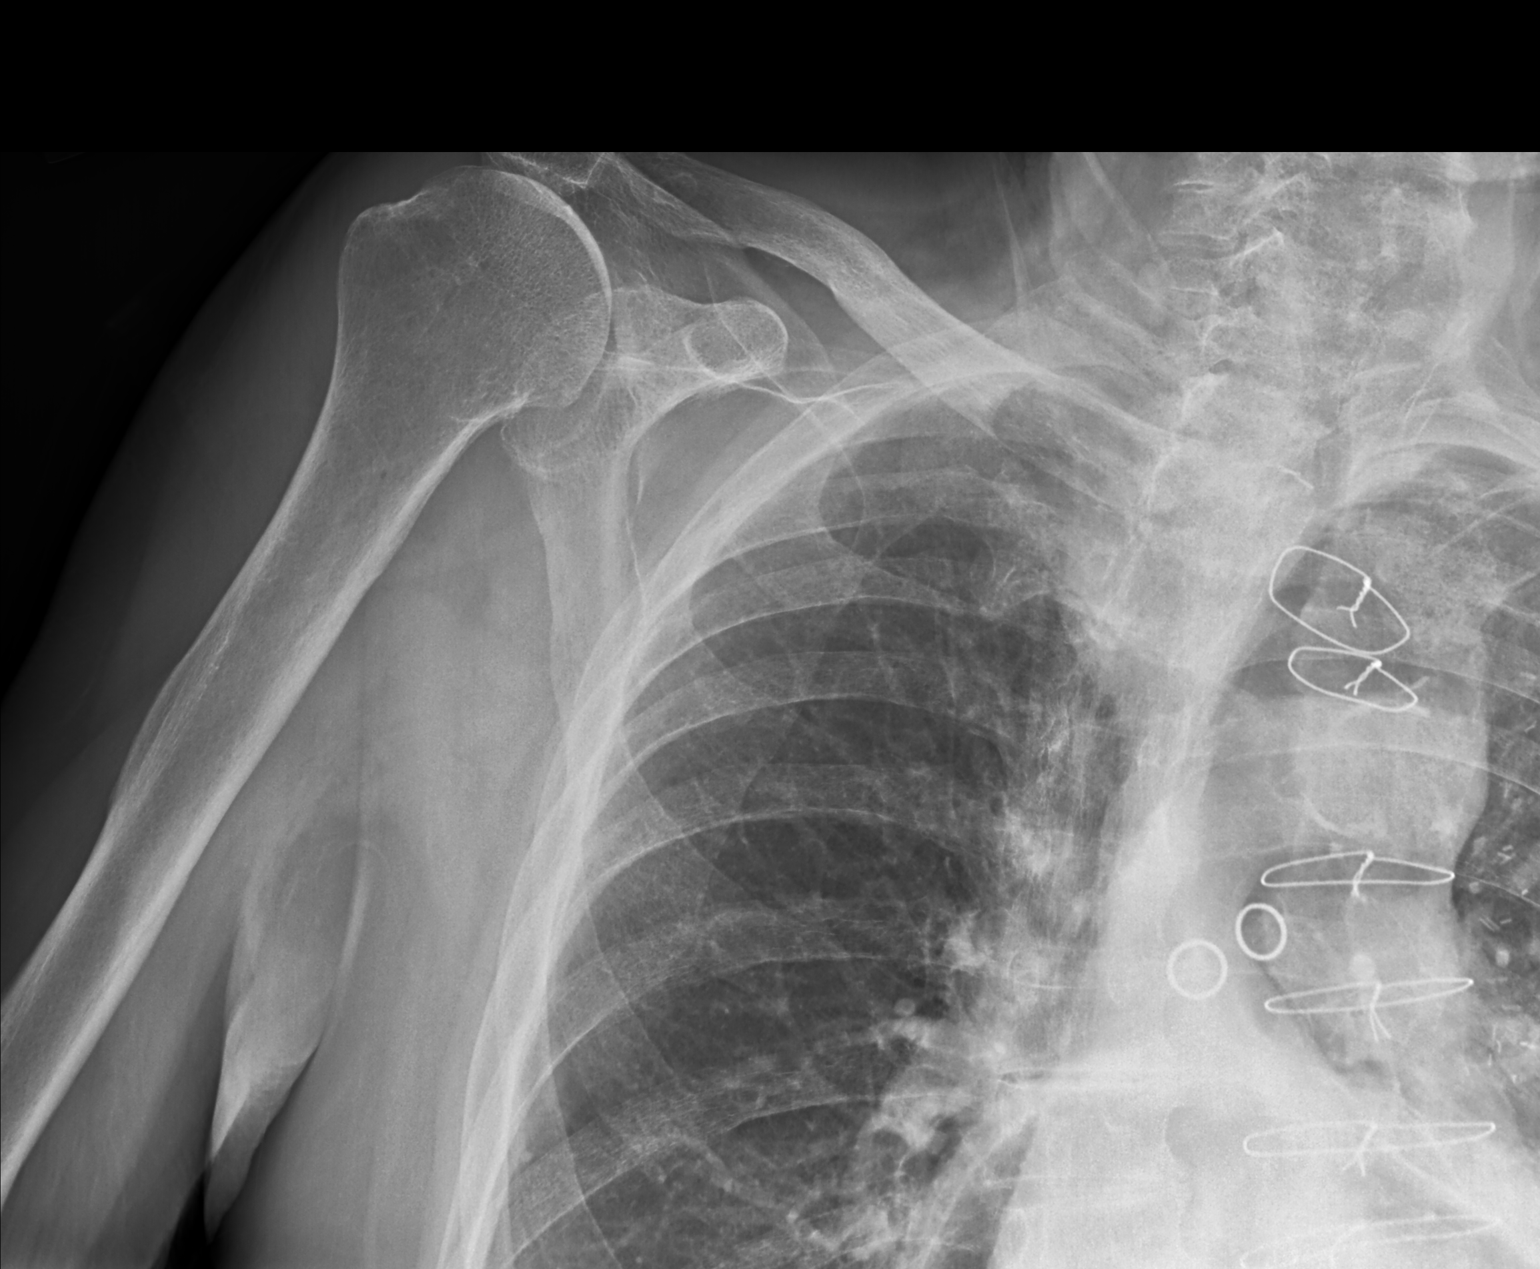

[shoulder external rotation ap]
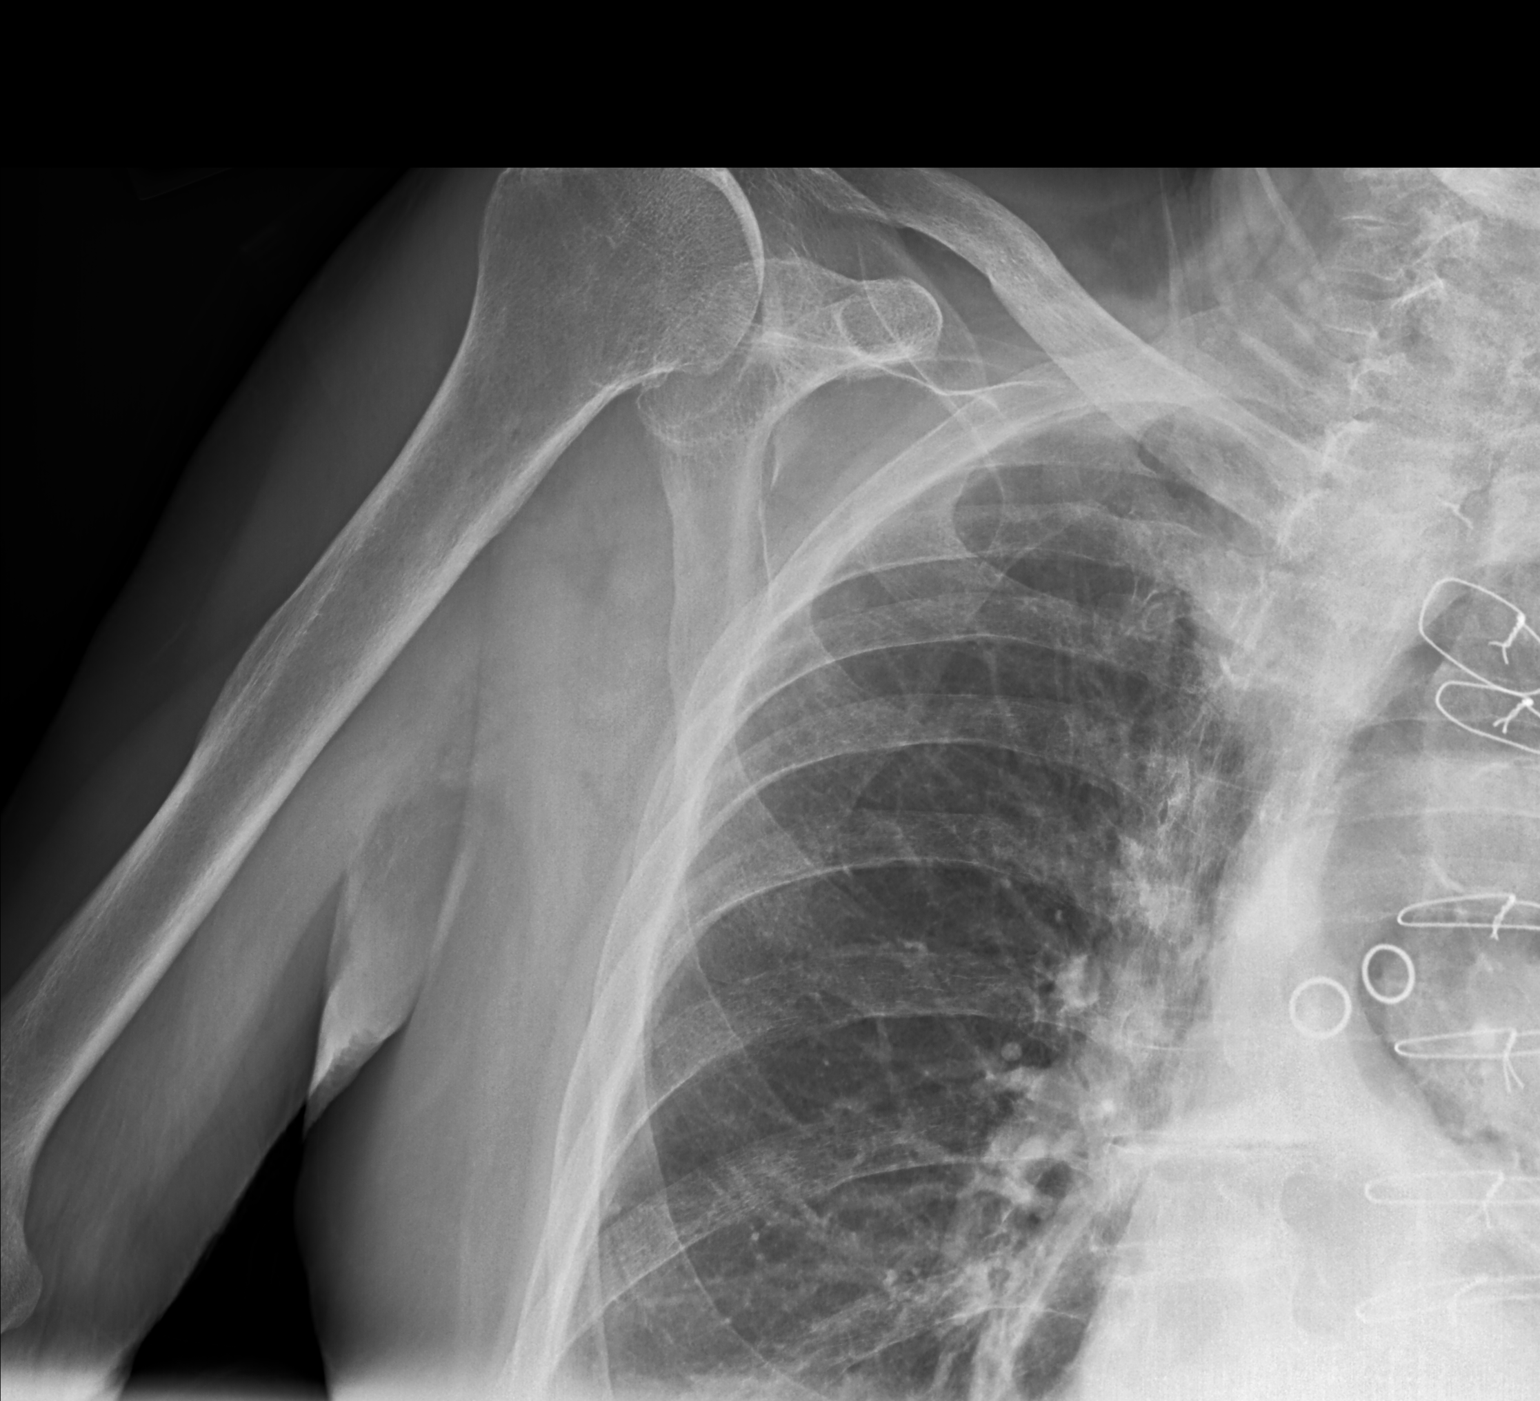

[shoulder y view]
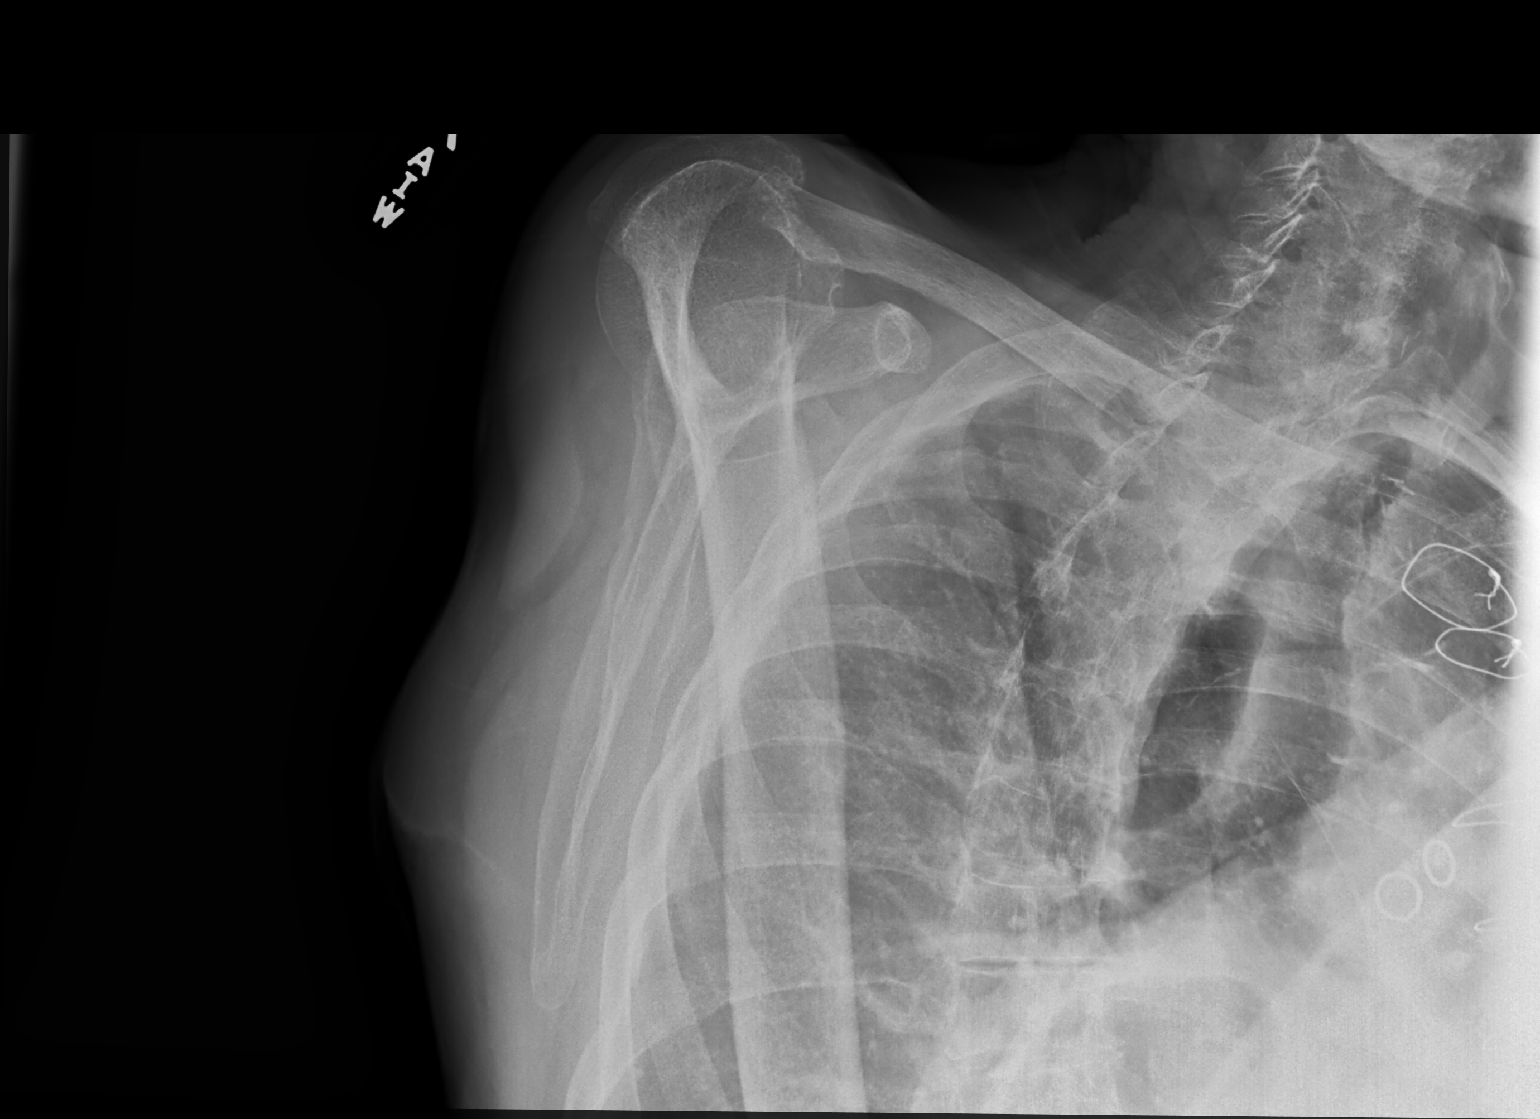

[3 of 3 positions shown; findings below may reference images not displayed]

FINDINGS: No fracture. No glenohumeral dislocation. No evidence of
acromioclavicular separation. High-riding right humeral head. No
suspicious focal osseous lesions. Mild AC joint osteoarthritis.
Intact visualized sternotomy wires. No pathologic soft tissue
calcifications.
IMPRESSION: 1. No fracture or dislocation.
2. High-riding right humeral head, suggesting right rotator cuff
tear or CPPD arthropathy.
3. Mild AC joint osteoarthritis.

## 2020-04-25 NOTE — Patient Instructions (Addendum)
We will call you with Xray results.   I placed an Orthopedic referral to Dr. Roland Rack at [facility]. You may apply heat as needed.  Tylenol for pain as directed.   Shoulder Pain Many things can cause shoulder pain, including:  An injury to the shoulder.  Overuse of the shoulder.  Arthritis. The source of the pain can be:  Inflammation.  An injury to the shoulder joint.  An injury to a tendon, ligament, or bone. Follow these instructions at home: Pay attention to changes in your symptoms. Let your health care provider know about them. Follow these instructions to relieve your pain. If you have a sling:  Wear the sling as told by your health care provider. Remove it only as told by your health care provider.  Loosen the sling if your fingers tingle, become numb, or turn cold and blue.  Keep the sling clean.  If the sling is not waterproof: ? Do not let it get wet. Remove it to shower or bathe.  Move your arm as little as possible, but keep your hand moving to prevent swelling. Managing pain, stiffness, and swelling   If directed, put ice on the painful area: ? Put ice in a plastic bag. ? Place a towel between your skin and the bag. ? Leave the ice on for 20 minutes, 2-3 times per day. Stop applying ice if it does not help with the pain.  Squeeze a soft ball or a foam pad as much as possible. This helps to keep the shoulder from swelling. It also helps to strengthen the arm. General instructions  Take over-the-counter and prescription medicines only as told by your health care provider.  Keep all follow-up visits as told by your health care provider. This is important. Contact a health care provider if:  Your pain gets worse.  Your pain is not relieved with medicines.  New pain develops in your arm, hand, or fingers. Get help right away if:  Your arm, hand, or fingers: ? Tingle. ? Become numb. ? Become swollen. ? Become painful. ? Turn white or  blue. Summary  Shoulder pain can be caused by an injury, overuse, or arthritis.  Pay attention to changes in your symptoms. Let your health care provider know about them.  This condition may be treated with a sling, ice, and pain medicines.  Contact your health care provider if the pain gets worse or new pain develops. Get help right away if your arm, hand, or fingers tingle or become numb, swollen, or painful.  Keep all follow-up visits as told by your health care provider. This is important. This information is not intended to replace advice given to you by your health care provider. Make sure you discuss any questions you have with your health care provider. Document Revised: 05/19/2018 Document Reviewed: 05/19/2018 Elsevier Patient Education  [place].

## 2020-04-25 NOTE — Progress Notes (Signed)
Established Patient Office Visit  Subjective:  Patient ID: Devin Reese, male    DOB: 1927/03/25  Age: 86 y.o. MRN: [id number]  CC:  Chief Complaint  Patient presents with  . Acute Visit    shoulder/arm pain    HPI Devin Reese is a very functional 86 year old male who presents for right arm pain.  He was getting something out of his trunk on 04/09/2020 and fell backward over a box and landed into the gravel and concrete.  He sustained severe bruising of his right upper arm.  He had no loss of consciousness.  [facility] ED for evaluation.  CT of the head and cervical spine were negative for any acute changes.  Right humerus was negative for fracture.  He did have abrasion and a tetanus booster was given.  He continues to have right arm pain-but only with movement. He cannot lift it up or abduct it at all d/t pain. The bruising has improved and the abrasion healed, but it is severely painful if he tries to use.  He is taking Tylenol and as long as he does not move the arm- it is not hurting him.  He denies numbness, tingling, or pain down the arm to the fingers.  He is not having any neck or thoracic pain.    Past Medical History:  Diagnosis Date  . Atrial flutter ([place])   . CAD (coronary artery disease)   . CKD (chronic kidney disease), stage II   . COPD (chronic obstructive pulmonary disease) ([place])   . Coronary artery disease    CABG in 1991 at [facility]. Most recent cardiac catheterization in 2633 was complicated by stroke.  . Diabetes mellitus without complication ([place])   . Hyperlipidemia   . Hypertension   . Prostate cancer ([place])   . Stroke [facility])     Past Surgical History:  Procedure Laterality Date  . CARDIAC CATHETERIZATION    . CARDIAC SURGERY    . CARDIOVERSION  03/29/13  . CORONARY ARTERY BYPASS GRAFT      Family History  Problem Relation Age of Onset  . Heart disease Father   . Coronary artery disease Father   . Cancer Other        family  hx  . Coronary artery disease Other        family hx   . Diabetes Brother   . Heart attack Brother     Social History   Socioeconomic History  . Marital status: Married    Spouse name: Not on file  . Number of children: Not on file  . Years of education: Not on file  . Highest education level: Not on file  Occupational History  . Not on file  Tobacco Use  . Smoking status: Former Smoker    Packs/day: 1.00    Years: 25.00    Pack years: 25.00    Types: Cigarettes  . Smokeless tobacco: Never Used  Substance and Sexual Activity  . Alcohol use: No    Comment: Rare  . Drug use: No  . Sexual activity: Not on file  Other Topics Concern  . Not on file  Social History Narrative   ** Merged History Encounter **       Retired, married, does not get regular exercise.    Social Determinants of Health   Financial Resource Strain:   . Difficulty of Paying Living Expenses:   Food Insecurity:   . Worried About [employer] of  Food in the Last Year:   . [place] in the Last Year:   Transportation Needs:   . Film/video editor (Medical):   Marland Kitchen Lack of Transportation (Non-Medical):   Physical Activity:   . Days of Exercise per Week:   . Minutes of Exercise per Session:   Stress:   . Feeling of Stress :   Social Connections:   . Frequency of Communication with Friends and Family:   . Frequency of Social Gatherings with Friends and Family:   . Attends Religious Services:   . Active Member of Clubs or Organizations:   . Attends Archivist Meetings:   Marland Kitchen Marital Status:   Intimate Partner Violence:   . Fear of Current or Ex-Partner:   . Emotionally Abused:   Mar[place] Kitchen Physically Abused:   . Sexually Abused:     Outpatient Medications Prior to Visit  Medication Sig Dispense Refill  . albuterol (PROVENTIL HFA;VENTOLIN HFA) 108 (90 Base) MCG/ACT inhaler Inhale 1-2 puffs into the lungs every 4 (four) hours as needed for wheezing or shortness of breath. 1 Inhaler 2  .  ELIQUIS 2.5 MG TABS tablet Take 1 tablet (2.5 mg total) by mouth 2 (two) times daily. 60 tablet 5  . Fluticasone-Umeclidin-Vilant (TRELEGY ELLIPTA IN) Inhale 1 puff into the lungs daily.    Marland Kitchen levothyroxine (SYNTHROID) 25 MCG tablet Take 1 tablet (25 mcg total) by mouth daily before breakfast. 90 tablet 0  . linagliptin (TRADJENTA) 5 MG TABS tablet Take 1 tablet (5 mg total) by mouth daily. 90 tablet 2  . nitroGLYCERIN (NITROSTAT) 0.4 MG SL tablet Place 1 tablet (0.4 mg total) under the tongue every 5 (five) minutes as needed for chest pain. 25 tablet 3  . atorvastatin (LIPITOR) 40 MG tablet Take 1 tablet (40 mg total) by mouth daily. 90 tablet 1  . sertraline (ZOLOFT) 50 MG tablet Take 50 mg by mouth daily.     No facility-administered medications prior to visit.    No Known Allergies  Review of systems: Pertinent positives noted in history of present illness.  The remainder of the systems are negative.   Objective:    Physical Exam  Constitutional: He appears well-developed and well-nourished.  HENT:  Head: Normocephalic and atraumatic.  Cardiovascular: Normal rate.  Pulmonary/Chest: Effort normal.  Musculoskeletal:     Cervical back: Normal range of motion and neck supple.     Comments: Cervical spine is nontender to palpation.  Paraspinous no tenderness paraspinous.  He has had good range of motion of the neck with flexion extension and lateral movements.   Shoulder: No gross deformity, but there is a slight sloping to the right shoulder compared to the left.  No bony deformity, rashes or lesions.  Nontender at the  subacromial bursa, AC joint. He is tender posterior upper arm. There is resolving bruising to the upper arm.  Shoulder has extremely poor active range of motion for forward elevation and cannot lift it beyond his side 0/5.  He is not able to abduct 0/5. Passive ROM not performed. Hand grasp strength is equal bilateral.  Distal right arm /hand  neurovascular exam is normal.   Distal pulses intact.   Vitals reviewed.   BP (!) 110/54 (BP Location: Left Arm, Patient Position: Sitting, Cuff Size: Normal)   Pulse 67   Temp 97.6 F (36.4 C) (Skin)   Ht 5\' 10"  (1.778 m)   Wt 174 lb (78.9 kg)   SpO2 97%   BMI  24.97 kg/m  Wt Readings from Last 3 Encounters:  04/25/20 174 lb (78.9 kg)  04/09/20 175 lb (79.4 kg)  03/28/20 177 lb 4 oz (80.4 kg)     Health Maintenance Due  Topic Date Due  . COVID-19 Vaccine (1) Never done  . FOOT EXAM  06/05/2019  . URINE MICROALBUMIN  01/28/2020  . HEMOGLOBIN A1C  02/09/2020    There are no preventive care reminders to display for this patient.  Lab Results  Component Value Date   TSH 1.19 08/20/2017   Lab Results  Component Value Date   WBC 5.3 06/04/2018   HGB 12.0 (L) 06/04/2018   HCT 36.0 (L) 06/04/2018   MCV 93.6 06/04/2018   PLT 192.0 06/04/2018   Lab Results  Component Value Date   NA 137 08/12/2019   K 5.0 08/12/2019   CO2 26 08/12/2019   GLUCOSE 118 (H) 08/12/2019   BUN 34 (H) 08/12/2019   CREATININE 2.30 (H) 08/12/2019   BILITOT 0.7 08/12/2019   ALKPHOS 89 08/12/2019   AST 17 08/12/2019   ALT 9 08/12/2019   PROT 6.4 08/12/2019   ALBUMIN 3.8 08/12/2019   CALCIUM 9.3 08/12/2019   ANIONGAP 5 07/02/2017   GFR 26.80 (L) 08/12/2019   Lab Results  Component Value Date   CHOL 130 08/12/2019   Lab Results  Component Value Date   HDL 35.80 (L) 08/12/2019   Lab Results  Component Value Date   LDLCALC 77 08/12/2019   Lab Results  Component Value Date   TRIG 86.0 08/12/2019   Lab Results  Component Value Date   CHOLHDL 4 08/12/2019   Lab Results  Component Value Date   HGBA1C 7.0 (H) 08/12/2019      Assessment & Plan:   Problem List Items Addressed This Visit      Other   Chronic anticoagulation   Relevant Orders   DG Shoulder Right (Completed)    Other Visit Diagnoses    Acute pain of right shoulder    -  Primary   Relevant Orders   DG Shoulder Right (Completed)    Ambulatory referral to Orthopedic Surgery      No orders of the defined types were placed in this encounter.  I placed an Orthopedic referral to Dr. Roland Rack at [facility]. You may apply heat as needed.  Tylenol for pain as directed.  04/26/2020: Addendum: Right shoulder 2 view x-ray returns with no fracture or dislocation.  Positive high-riding right humeral head suggesting right rotator cuff tear or CPPD arthropathy.  Mild AC joint osteoarthritis.  Follow-up: Referral has already been placed for urgent consult with Dr. Roland Rack. The patient had been advised to apply heat  as needed, Tylenol as needed.   Dr. Roland Rack was on call today and I spoke to him about this case.  Patient's advanced age, and cardiovascular history will limit surgical options, but he will see him in the 1-2 weeks. Continue conservative treatment plan for now.  I spoke to his wife, and she reports he applied heat yesterday to his shoulder, took a Tylenol dose and he feels much better today.  The patient is out working in his workshop.   Return in about 1 week (around 05/02/2020).  with Dr. Caryl Bis.    Denice Paradise, NP

## 2020-04-26 ENCOUNTER — Telehealth: Payer: Self-pay | Admitting: Nurse Practitioner

## 2020-04-26 NOTE — Telephone Encounter (Signed)
Xray results d/w his wife and waiting on referral to Dr. Roland Rack.  The patient is feeling better today after using heat and Tylenol.

## 2020-05-02 ENCOUNTER — Other Ambulatory Visit: Payer: Self-pay

## 2020-05-02 ENCOUNTER — Ambulatory Visit (INDEPENDENT_AMBULATORY_CARE_PROVIDER_SITE_OTHER): Payer: Medicare HMO | Admitting: Family Medicine

## 2020-05-02 ENCOUNTER — Encounter: Payer: Self-pay | Admitting: Family Medicine

## 2020-05-02 VITALS — BP 120/80 | HR 54 | Temp 97.9°F | Ht 70.0 in | Wt 173.4 lb

## 2020-05-02 DIAGNOSIS — E119 Type 2 diabetes mellitus without complications: Secondary | ICD-10-CM

## 2020-05-02 DIAGNOSIS — E039 Hypothyroidism, unspecified: Secondary | ICD-10-CM | POA: Diagnosis not present

## 2020-05-02 DIAGNOSIS — E785 Hyperlipidemia, unspecified: Secondary | ICD-10-CM

## 2020-05-02 DIAGNOSIS — I4892 Unspecified atrial flutter: Secondary | ICD-10-CM

## 2020-05-02 DIAGNOSIS — M25511 Pain in right shoulder: Secondary | ICD-10-CM | POA: Diagnosis not present

## 2020-05-02 DIAGNOSIS — S46011D Strain of muscle(s) and tendon(s) of the rotator cuff of right shoulder, subsequent encounter: Secondary | ICD-10-CM

## 2020-05-02 DIAGNOSIS — J449 Chronic obstructive pulmonary disease, unspecified: Secondary | ICD-10-CM | POA: Diagnosis not present

## 2020-05-02 DIAGNOSIS — M75101 Unspecified rotator cuff tear or rupture of right shoulder, not specified as traumatic: Secondary | ICD-10-CM | POA: Insufficient documentation

## 2020-05-02 DIAGNOSIS — M12811 Other specific arthropathies, not elsewhere classified, right shoulder: Secondary | ICD-10-CM | POA: Diagnosis not present

## 2020-05-02 LAB — LIPID PANEL
Cholesterol: 132 mg/dL (ref 0–200)
HDL: 33.1 mg/dL — ABNORMAL LOW (ref >39.00)
LDL Cholesterol: 72 mg/dL (ref 0–99)
NonHDL: 99.26
Total CHOL/HDL Ratio: 4
Triglycerides: 134 mg/dL (ref 0.0–149.0)
VLDL: 26.8 mg/dL (ref 0.0–40.0)

## 2020-05-02 LAB — CBC
HCT: 36.5 % — ABNORMAL LOW (ref 39.0–52.0)
Hemoglobin: 12.3 g/dL — ABNORMAL LOW (ref 13.0–17.0)
MCHC: 33.7 g/dL (ref 30.0–36.0)
MCV: 93.2 fl (ref 78.0–100.0)
Platelets: 199 10*3/uL (ref 150.0–400.0)
RBC: 3.91 Mil/uL — ABNORMAL LOW (ref 4.22–5.81)
RDW: 13.9 % (ref 11.5–15.5)
WBC: 5.4 10*3/uL (ref 4.0–10.5)

## 2020-05-02 LAB — COMPREHENSIVE METABOLIC PANEL
ALT: 9 U/L (ref 0–53)
AST: 15 U/L (ref 0–37)
Albumin: 3.7 g/dL (ref 3.5–5.2)
Alkaline Phosphatase: 93 U/L (ref 39–117)
BUN: 26 mg/dL — ABNORMAL HIGH (ref 6–23)
CO2: 25 mEq/L (ref 19–32)
Calcium: 9.2 mg/dL (ref 8.4–10.5)
Chloride: 105 mEq/L (ref 96–112)
Creatinine, Ser: 1.63 mg/dL — ABNORMAL HIGH (ref 0.40–1.50)
GFR: 39.82 mL/min — ABNORMAL LOW (ref >60.00)
Glucose, Bld: 144 mg/dL — ABNORMAL HIGH (ref 70–99)
Potassium: 4.1 mEq/L (ref 3.5–5.1)
Sodium: 138 mEq/L (ref 135–145)
Total Bilirubin: 0.7 mg/dL (ref 0.2–1.2)
Total Protein: 6.5 g/dL (ref 6.0–8.3)

## 2020-05-02 LAB — HEMOGLOBIN A1C: Hgb A1c MFr Bld: 7.9 % — ABNORMAL HIGH (ref 4.6–6.5)

## 2020-05-02 LAB — MICROALBUMIN / CREATININE URINE RATIO
Creatinine,U: 228.7 mg/dL
Microalb Creat Ratio: 0.4 mg/g (ref 0.0–30.0)
Microalb, Ur: 0.8 mg/dL (ref 0.0–1.9)

## 2020-05-02 LAB — TSH: TSH: 0.97 u[IU]/mL (ref 0.35–4.50)

## 2020-05-02 NOTE — Patient Instructions (Signed)
Nice to see you. We will check lab work today. Please monitor your shoulder and if it worsens please contact orthopedics.

## 2020-05-02 NOTE — Assessment & Plan Note (Signed)
Rate controlled.  He will continue Eliquis.  Monitor for symptoms.

## 2020-05-02 NOTE — Assessment & Plan Note (Signed)
Continue Synthroid.  Check TSH. 

## 2020-05-02 NOTE — Assessment & Plan Note (Signed)
Adequate control.  He reports rare use of albuterol.  Only occasional wheezing.  We will continue Trelegy.

## 2020-05-02 NOTE — Assessment & Plan Note (Signed)
Likely rotator cuff tear based on history and x-ray imaging.  He is being managed by orthopedics.  He will monitor for improvement.

## 2020-05-02 NOTE — Assessment & Plan Note (Addendum)
Check lipid panel  

## 2020-05-02 NOTE — Assessment & Plan Note (Signed)
Continue Tradjenta.  Check A1c and urine microalbumin.

## 2020-05-02 NOTE — Progress Notes (Signed)
[name], MD Phone: [phone]  Devin Reese is a 86 y.o. male who presents today for f/u.  Right shoulder injury: Patient had a fall about 2 weeks ago.  He was seen in the ED and subsequently followed up..  Likely with rotator cuff tear and he saw orthopedics today and had a steroid injection.  He has discomfort with lifting and abducting his arm.  The pain is progressively improving.  They note orthopedics advised against surgery at this time given his age.  Diabetes: Not consistently checking sugars though they do note it was 159 yesterday.  Currently on Tradjenta.  No polyuria or polydipsia.  No hypoglycemia.  Hypothyroidism: Taking Synthroid.  No skin changes.  No heat or cold intolerance.  A.  Flutter: Currently on Eliquis.  No chest pain.  No shortness of breath.  No bleeding issues.  No palpitations.  Social History   Tobacco Use  Smoking Status Former Smoker  . Packs/day: 1.00  . Years: 25.00  . Pack years: 25.00  . Types: Cigarettes  Smokeless Tobacco Never Used     ROS see history of present illness  Objective  Physical Exam Vitals:   05/02/20 1159  BP: 120/80  Pulse: (!) 54  Temp: 97.9 F (36.6 C)  SpO2: 97%    BP Readings from Last 3 Encounters:  05/02/20 120/80  04/25/20 (!) 110/54  04/09/20 123/64   Wt Readings from Last 3 Encounters:  05/02/20 173 lb 6.4 oz (78.7 kg)  04/25/20 174 lb (78.9 kg)  04/09/20 175 lb (79.4 kg)    Physical Exam Constitutional:      General: He is not in acute distress.    Appearance: He is not diaphoretic.  Cardiovascular:     Rate and Rhythm: Normal rate. Rhythm irregularly irregular.     Heart sounds: Normal heart sounds.  Pulmonary:     Effort: Pulmonary effort is normal.     Breath sounds: Normal breath sounds.  Musculoskeletal:     Right lower leg: No edema.     Left lower leg: No edema.  Skin:    General: Skin is warm and dry.  Neurological:     Mental Status: He is alert.     Diabetic Foot Exam - Simple   Simple Foot Form Diabetic Foot exam was performed with the following findings: Yes 05/02/2020 12:20 PM  Visual Inspection No deformities, no ulcerations, no other skin breakdown bilaterally: Yes Sensation Testing See comments: Yes Pulse Check Posterior Tibialis and Dorsalis pulse intact bilaterally: Yes Comments Mild decrease sensation to monofilament testing on the dorsum of his feet, intact light touch sensation      Assessment/Plan: Please see individual problem list.  Atrial flutter Rate controlled.  He will continue Eliquis.  Monitor for symptoms.  COPD (chronic obstructive pulmonary disease) (HCC) Adequate control.  He reports rare use of albuterol.  Only occasional wheezing.  We will continue Trelegy.  Hypothyroidism Continue Synthroid.  Check TSH.  Diabetes ([place]) Continue Tradjenta.  Check A1c and urine microalbumin.  Rotator cuff tear, right Likely rotator cuff tear based on history and x-ray imaging.  He is being managed by orthopedics.  He will monitor for improvement.  Hyperlipidemia Check lipid panel.   Orders Placed This Encounter  Procedures  . HgB A1c  . Urine Microalbumin w/creat. ratio  . Comp Met (CMET)  . TSH  . Lipid panel  . CBC    No orders of the defined types were placed in this encounter.  This visit occurred during the SARS-CoV-2 public health emergency.  Safety protocols were in place, including screening questions prior to the visit, additional usage of staff PPE, and extensive cleaning of exam room while observing appropriate contact time as indicated for disinfecting solutions.    Jamicia Haaland, MD [facility] [place]  

## 2020-05-04 ENCOUNTER — Telehealth: Payer: Self-pay

## 2020-05-04 NOTE — Telephone Encounter (Signed)
-----   Message from Leone Haven, MD sent at 05/03/2020 11:46 AM EDT ----- Please let the patient know his A1c has worsened. It is adequately controlled for his age though he does need to work on diet and exercise to improve it. He should continue his tradjenta. His kidney function has improved some. His LDL cholesterol is near goal. I would suggest rechecking at his next visit with him working on diet and exercise. His other labs are acceptable.

## 2020-05-15 ENCOUNTER — Telehealth: Payer: Self-pay | Admitting: Family Medicine

## 2020-05-15 NOTE — Telephone Encounter (Signed)
Rejection Reason - Other - Patient cancelled" [facility] said about 8 hours ago  Pt had scheduled appt 05/02/2020 at 10am

## 2020-05-16 NOTE — Telephone Encounter (Signed)
Sending to [employer] as she saw the patient.

## 2020-05-17 DIAGNOSIS — M25511 Pain in right shoulder: Secondary | ICD-10-CM | POA: Diagnosis not present

## 2020-05-25 DIAGNOSIS — M25511 Pain in right shoulder: Secondary | ICD-10-CM | POA: Diagnosis not present

## 2020-05-29 DIAGNOSIS — M25511 Pain in right shoulder: Secondary | ICD-10-CM | POA: Diagnosis not present

## 2020-05-31 DIAGNOSIS — M25511 Pain in right shoulder: Secondary | ICD-10-CM | POA: Diagnosis not present

## 2020-06-05 DIAGNOSIS — M25511 Pain in right shoulder: Secondary | ICD-10-CM | POA: Diagnosis not present

## 2020-06-08 DIAGNOSIS — M25511 Pain in right shoulder: Secondary | ICD-10-CM | POA: Diagnosis not present

## 2020-06-09 ENCOUNTER — Other Ambulatory Visit: Payer: Self-pay | Admitting: Family Medicine

## 2020-06-09 ENCOUNTER — Other Ambulatory Visit: Payer: Self-pay | Admitting: Cardiovascular Disease

## 2020-06-12 DIAGNOSIS — M25511 Pain in right shoulder: Secondary | ICD-10-CM | POA: Diagnosis not present

## 2020-06-14 DIAGNOSIS — M25511 Pain in right shoulder: Secondary | ICD-10-CM | POA: Diagnosis not present

## 2020-06-20 DIAGNOSIS — M25511 Pain in right shoulder: Secondary | ICD-10-CM | POA: Diagnosis not present

## 2020-06-22 DIAGNOSIS — M25511 Pain in right shoulder: Secondary | ICD-10-CM | POA: Diagnosis not present

## 2020-06-27 DIAGNOSIS — M25511 Pain in right shoulder: Secondary | ICD-10-CM | POA: Diagnosis not present

## 2020-07-03 DIAGNOSIS — M25511 Pain in right shoulder: Secondary | ICD-10-CM | POA: Diagnosis not present

## 2020-07-06 ENCOUNTER — Other Ambulatory Visit: Payer: Self-pay | Admitting: Family Medicine

## 2020-07-10 DIAGNOSIS — M25511 Pain in right shoulder: Secondary | ICD-10-CM | POA: Diagnosis not present

## 2020-07-14 DIAGNOSIS — M25511 Pain in right shoulder: Secondary | ICD-10-CM | POA: Diagnosis not present

## 2020-07-17 ENCOUNTER — Encounter (INDEPENDENT_AMBULATORY_CARE_PROVIDER_SITE_OTHER): Payer: Self-pay

## 2020-07-17 ENCOUNTER — Ambulatory Visit (INDEPENDENT_AMBULATORY_CARE_PROVIDER_SITE_OTHER): Payer: Medicare HMO

## 2020-07-17 VITALS — Ht 65.0 in | Wt 173.0 lb

## 2020-07-17 DIAGNOSIS — Z0000 Encounter for general adult medical examination without abnormal findings: Secondary | ICD-10-CM

## 2020-07-17 NOTE — Patient Instructions (Addendum)
Devin Reese , Thank you for taking time to come for your Medicare Wellness Visit. I appreciate your ongoing commitment to your health goals. Please review the following plan we discussed and let me know if I can assist you in the future.   These are the goals we discussed: Goals      Patient Stated   .  Stay hydrated (pt-stated)       This is a list of the screening recommended for you and due dates:  Health Maintenance  Topic Date Due  . COVID-19 Vaccine (1) Never done  . Flu Shot  06/18/2020  . Hemoglobin A1C  11/01/2020  . Eye exam for diabetics  12/23/2020  . Complete foot exam   05/02/2021  . Urine Protein Check  05/02/2021  . Tetanus Vaccine  04/09/2030  . Pneumonia vaccines  Completed    Immunizations Immunization History  Administered Date(s) Administered  . Fluad Quad(high Dose 65+) 08/12/2019  . Influenza, High Dose Seasonal PF 08/20/2017, 09/29/2018  . Pneumococcal Conjugate-13 02/01/2019  . Pneumococcal Polysaccharide-23 12/25/2015  . Tdap 04/09/2020   Keep all routine maintenance appointments.   Follow up 11/01/20 @ 10:30   Advanced directives: declined.   Conditions/risks identified: none new.  Follow up in one year for your annual wellness visit.   Preventive Care 53 Years and Older, Male Preventive care refers to lifestyle choices and visits with your health care provider that can promote health and wellness. What does preventive care include?  A yearly physical exam. This is also called an annual well check.  Dental exams once or twice a year.  Routine eye exams. Ask your health care provider how often you should have your eyes checked.  Personal lifestyle choices, including:  Daily care of your teeth and gums.  Regular physical activity.  Eating a healthy diet.  Avoiding tobacco and drug use.  Limiting alcohol use.  Practicing safe sex.  Taking low doses of aspirin every day.  Taking vitamin and mineral supplements as recommended  by your health care provider. What happens during an annual well check? The services and screenings done by your health care provider during your annual well check will depend on your age, overall health, lifestyle risk factors, and family history of disease. Counseling  Your health care provider may ask you questions about your:  Alcohol use.  Tobacco use.  Drug use.  Emotional well-being.  Home and relationship well-being.  Sexual activity.  Eating habits.  History of falls.  Memory and ability to understand (cognition).  Work and work Statistician. Screening  You may have the following tests or measurements:  Height, weight, and BMI.  Blood pressure.  Lipid and cholesterol levels. These may be checked every 5 years, or more frequently if you are over 39 years old.  Skin check.  Lung cancer screening. You may have this screening every year starting at age 89 if you have a 30-pack-year history of smoking and currently smoke or have quit within the past 15 years.  Fecal occult blood test (FOBT) of the stool. You may have this test every year starting at age 29.  Flexible sigmoidoscopy or colonoscopy. You may have a sigmoidoscopy every 5 years or a colonoscopy every 10 years starting at age 11.  Prostate cancer screening. Recommendations will vary depending on your family history and other risks.  Hepatitis C blood test.  Hepatitis B blood test.  Sexually transmitted disease (STD) testing.  Diabetes screening. This is done by checking your blood  sugar (glucose) after you have not eaten for a while (fasting). You may have this done every 1-3 years.  Abdominal aortic aneurysm (AAA) screening. You may need this if you are a current or former smoker.  Osteoporosis. You may be screened starting at age 44 if you are at high risk. Talk with your health care provider about your test results, treatment options, and if necessary, the need for more tests. Vaccines  Your  health care provider may recommend certain vaccines, such as:  Influenza vaccine. This is recommended every year.  Tetanus, diphtheria, and acellular pertussis (Tdap, Td) vaccine. You may need a Td booster every 10 years.  Zoster vaccine. You may need this after age 41.  Pneumococcal 13-valent conjugate (PCV13) vaccine. One dose is recommended after age 45.  Pneumococcal polysaccharide (PPSV23) vaccine. One dose is recommended after age 14. Talk to your health care provider about which screenings and vaccines you need and how often you need them. This information is not intended to replace advice given to you by your health care provider. Make sure you discuss any questions you have with your health care provider. Document Released: 12/01/2015 Document Revised: 07/24/2016 Document Reviewed: 09/05/2015 Elsevier Interactive Patient Education  2017 [place] Prevention in the Home Falls can cause injuries. They can happen to people of all ages. There are many things you can do to make your home safe and to help prevent falls. What can I do on the outside of my home?  Regularly fix the edges of walkways and driveways and fix any cracks.  Remove anything that might make you trip as you walk through a door, such as a raised step or threshold.  Reese any bushes or trees on the path to your home.  Use bright outdoor lighting.  Clear any walking paths of anything that might make someone trip, such as rocks or tools.  Regularly check to see if handrails are loose or broken. Make sure that both sides of any steps have handrails.  Any raised decks and porches should have guardrails on the edges.  Have any leaves, snow, or ice cleared regularly.  Use sand or salt on walking paths during winter.  Clean up any spills in your garage right away. This includes oil or grease spills. What can I do in the bathroom?  Use night lights.  Install grab bars by the toilet and in the tub and  shower. Do not use towel bars as grab bars.  Use non-skid mats or decals in the tub or shower.  If you need to sit down in the shower, use a plastic, non-slip stool.  Keep the floor dry. Clean up any water that spills on the floor as soon as it happens.  Remove soap buildup in the tub or shower regularly.  Attach bath mats securely with double-sided non-slip rug tape.  Do not have throw rugs and other things on the floor that can make you trip. What can I do in the bedroom?  Use night lights.  Make sure that you have a light by your bed that is easy to reach.  Do not use any sheets or blankets that are too big for your bed. They should not hang down onto the floor.  Have a firm chair that has side arms. You can use this for support while you get dressed.  Do not have throw rugs and other things on the floor that can make you trip. What can I do in the  kitchen?  Clean up any spills right away.  Avoid walking on wet floors.  Keep items that you use a lot in easy-to-reach places.  If you need to reach something above you, use a strong step stool that has a grab bar.  Keep electrical cords out of the way.  Do not use floor polish or wax that makes floors slippery. If you must use wax, use non-skid floor wax.  Do not have throw rugs and other things on the floor that can make you trip. What can I do with my stairs?  Do not leave any items on the stairs.  Make sure that there are handrails on both sides of the stairs and use them. Fix handrails that are broken or loose. Make sure that handrails are as long as the stairways.  Check any carpeting to make sure that it is firmly attached to the stairs. Fix any carpet that is loose or worn.  Avoid having throw rugs at the top or bottom of the stairs. If you do have throw rugs, attach them to the floor with carpet tape.  Make sure that you have a light switch at the top of the stairs and the bottom of the stairs. If you do not  have them, ask someone to add them for you. What else can I do to help prevent falls?  Wear shoes that:  Do not have high heels.  Have rubber bottoms.  Are comfortable and fit you well.  Are closed at the toe. Do not wear sandals.  If you use a stepladder:  Make sure that it is fully opened. Do not climb a closed stepladder.  Make sure that both sides of the stepladder are locked into place.  Ask someone to hold it for you, if possible.  Clearly mark and make sure that you can see:  Any grab bars or handrails.  First and last steps.  Where the edge of each step is.  Use tools that help you move around (mobility aids) if they are needed. These include:  Canes.  Walkers.  Scooters.  Crutches.  Turn on the lights when you go into a dark area. Replace any light bulbs as soon as they burn out.  Set up your furniture so you have a clear path. Avoid moving your furniture around.  If any of your floors are uneven, fix them.  If there are any pets around you, be aware of where they are.  Review your medicines with your doctor. Some medicines can make you feel dizzy. This can increase your chance of falling. Ask your doctor what other things that you can do to help prevent falls. This information is not intended to replace advice given to you by your health care provider. Make sure you discuss any questions you have with your health care provider. Document Released: 08/31/2009 Document Revised: 04/11/2016 Document Reviewed: 12/09/2014 Elsevier Interactive Patient Education  2017 [employer].

## 2020-07-17 NOTE — Progress Notes (Signed)
Subjective:   Devin Reese is a 86 y.o. male who presents for Medicare Annual/Subsequent preventive examination.  Review of Systems    No ROS.  Medicare Wellness Virtual Visit.    Cardiac Risk Factors include: advanced age (>70men, >61 women);diabetes mellitus;hypertension     Objective:    Today's Vitals   07/17/20 1036  Weight: 173 lb (78.5 kg)  Height: 5\' 5"  (1.651 m)   Body mass index is 28.79 kg/m.  Advanced Directives 07/17/2020 04/09/2020 07/15/2019 06/04/2018 11/20/2017 07/01/2017 07/01/2017  Does Patient Have a Medical Advance Directive? No No No No No No No  Would patient like information on creating a medical advance directive? No - Patient declined No - Patient declined No - Patient declined No - Patient declined No - Patient declined No - Patient declined -    Current Medications (verified) Outpatient Encounter Medications as of 07/17/2020  Medication Sig  . albuterol (PROVENTIL HFA;VENTOLIN HFA) 108 (90 Base) MCG/ACT inhaler Inhale 1-2 puffs into the lungs every 4 (four) hours as needed for wheezing or shortness of breath.  Marland Kitchen atorvastatin (LIPITOR) 40 MG tablet Take 1 tablet (40 mg total) by mouth daily.  Marland Kitchen ELIQUIS 2.5 MG TABS tablet Take 1 tablet (2.5 mg total) by mouth 2 (two) times daily.  . Fluticasone-Umeclidin-Vilant (TRELEGY ELLIPTA IN) Inhale 1 puff into the lungs daily.  Marland Kitchen levothyroxine (SYNTHROID) 25 MCG tablet Take 1 tablet (25 mcg total) by mouth daily before breakfast.  . nitroGLYCERIN (NITROSTAT) 0.4 MG SL tablet Place 1 tablet (0.4 mg total) under the tongue every 5 (five) minutes as needed for chest pain.  . TRADJENTA 5 MG TABS tablet Take 1 tablet (5 mg total) by mouth daily.   No facility-administered encounter medications on file as of 07/17/2020.    Allergies (verified) Patient has no known allergies.   History: Past Medical History:  Diagnosis Date  . Atrial flutter ([place])   . CAD (coronary artery disease)   . CKD (chronic kidney  disease), stage II   . COPD (chronic obstructive pulmonary disease) ([place])   . Coronary artery disease    CABG in 1991 at [facility]. Most recent cardiac catheterization in 0623 was complicated by stroke.  . Diabetes mellitus without complication ([place])   . Hyperlipidemia   . Hypertension   . Prostate cancer ([place])   . Stroke [facility])    Past Surgical History:  Procedure Laterality Date  . CARDIAC CATHETERIZATION    . CARDIAC SURGERY    . CARDIOVERSION  03/29/13  . CORONARY ARTERY BYPASS GRAFT     Family History  Problem Relation Age of Onset  . Heart disease Father   . Coronary artery disease Father   . Cancer Other        family hx  . Coronary artery disease Other        family hx   . Diabetes Brother   . Heart attack Brother    Social History   Socioeconomic History  . Marital status: Married    Spouse name: Not on file  . Number of children: Not on file  . Years of education: Not on file  . Highest education level: Not on file  Occupational History  . Not on file  Tobacco Use  . Smoking status: Former Smoker    Packs/day: 1.00    Years: 25.00    Pack years: 25.00    Types: Cigarettes  . Smokeless tobacco: Never Used  Vaping Use  . Vaping Use: Never used  Substance and Sexual Activity  . Alcohol use: No    Comment: Rare  . Drug use: No  . Sexual activity: Not on file  Other Topics Concern  . Not on file  Social History Narrative   ** Merged History Encounter **       Retired, married, does not get regular exercise.    Social Determinants of Health   Financial Resource Strain: Low Risk   . Difficulty of Paying Living Expenses: Not hard at all  Food Insecurity: No Food Insecurity  . Worried About Charity fundraiser in the Last Year: Never true  . Ran Out of Food in the Last Year: Never true  Transportation Needs: No Transportation Needs  . Lack of Transportation (Medical): No  . Lack of Transportation (Non-Medical): No  Physical Activity: Insufficiently  Active  . Days of Exercise per Week: 1 day  . Minutes of Exercise per Session: 60 min  Stress: No Stress Concern Present  . Feeling of Stress : Not at all  Social Connections: Unknown  . Frequency of Communication with Friends and Family: Not on file  . Frequency of Social Gatherings with Friends and Family: Not on file  . Attends Religious Services: Not on file  . Active Member of Clubs or Organizations: Not on file  . Attends Archivist Meetings: Not on file  . Marital Status: Married    Tobacco Counseling Counseling given: Not Answered   Clinical Intake:  Pre-visit preparation completed: Yes        Diabetes: Yes (Followed by pcp)  How often do you need to have someone help you when you read instructions, pamphlets, or other written materials from your doctor or pharmacy?: 1 - Never   Interpreter Needed?: No      Activities of Daily Living In your present state of health, do you have any difficulty performing the following activities: 07/17/2020  Hearing? Y  Vision? N  Difficulty concentrating or making decisions? N  Walking or climbing stairs? N  Dressing or bathing? N  Doing errands, shopping? Y  Comment Wife accompanies  Conservation officer, nature and eating ? N  Using the Toilet? N  In the past six months, have you accidently leaked urine? N  Do you have problems with loss of bowel control? N  Managing your Medications? Y  Comment Wife assist  Managing your Finances? Y  Comment Wife assist  Housekeeping or managing your Housekeeping? N  Some recent data might be hidden    Patient Care Team: Devin Haven, MD as PCP - General (Family Medicine) Devin Hampshire, MD as PCP - Cardiology (Cardiology) Devin Market, MD (Family Medicine)  Indicate any recent Medical Services you may have received from other than [facility] providers in the past year (date may be approximate).     Assessment:   This is a routine wellness examination for Devin Reese.  I  connected with  today by telephone and verified that I am speaking with the correct person using two identifiers. Location patient: home Location provider: work Persons participating in the virtual visit: patient, wife, Marine scientist.    I discussed the limitations, risks, security and privacy concerns of performing an evaluation and management service by telephone and the availability of in person appointments. The patient expressed understanding and verbally consented to this telephonic visit.    Interactive audio and video telecommunications were attempted between this provider and patient, however failed, due to patient having technical difficulties OR patient did not have access  to video capability.  We continued and completed visit with audio only.  Some vital signs may be absent or patient reported.    Hearing/Vision screen  Hearing Screening   125Hz  250Hz  500Hz  1000Hz  2000Hz  3000Hz  4000Hz  6000Hz  8000Hz   Right ear:           Left ear:           Comments: Hearing aids  Vision Screening Comments: Followed by [facility] Wears corrective lenses No retinopathy reported Visual acuity not assessed, virtual visit.  They have seen their ophthalmologist in the last 12 months.     Dietary issues and exercise activities discussed: Current Exercise Habits: The patient does not participate in regular exercise at present, Time (Minutes): 60, Frequency (Times/Week): 1, Weekly Exercise (Minutes/Week): 60, Intensity: Mild  Regular diet Good water intake Caffeine- cup of coffee every once in awhile   Goals      Patient Stated   .  Stay hydrated (pt-stated)      Depression Screen PHQ 2/9 Scores 07/17/2020 05/02/2020 12/10/2019 07/15/2019 06/04/2018 11/20/2017 07/07/2017  PHQ - 2 Score 0 0 0 0 0 0 0    Fall Risk Fall Risk  07/17/2020 04/25/2020 12/10/2019 06/07/2019 06/04/2018  Falls in the past year? 0 1 0 1 No  Comment None in the last 5 months - - - -  Number falls in past yr: - 1 0 0 -   Injury with Fall? - 1 - 0 -  Risk for fall due to : - History of fall(s) - - -  Follow up Falls evaluation completed Falls evaluation completed Falls evaluation completed Falls evaluation completed -   Handrails in use when climbing stairs? Yes Home free of loose throw rugs in walkways, pet beds, electrical cords, etc? Yes  Adequate lighting in your home to reduce risk of falls? Yes   ASSISTIVE DEVICES UTILIZED TO PREVENT FALLS:  Life alert? No  Use of a cane, walker or w/c? No  Grab bars in the bathroom? Yes  Shower chair or bench in shower? No    TIMED UP AND GO:  Was the test performed? No . Virtual visit.    Cognitive Function:  Patient is alert.  Enjoys reading for brain health.   6CIT Screen 07/17/2020 07/15/2019 06/04/2018  What Year? 4 points 0 points 0 points  What month? 0 points 0 points 0 points  What time? 0 points 0 points 0 points  Count back from 20 - 0 points 0 points  Months in reverse - 4 points 0 points  Repeat phrase - - 0 points  Total Score - - 0    Immunizations Immunization History  Administered Date(s) Administered  . Fluad Quad(high Dose 65+) 08/12/2019  . Influenza, High Dose Seasonal PF 08/20/2017, 09/29/2018  . Pneumococcal Conjugate-13 02/01/2019  . Pneumococcal Polysaccharide-23 12/25/2015  . Tdap 04/09/2020    Health Maintenance Health Maintenance  Topic Date Due  . COVID-19 Vaccine (1) Never done  . INFLUENZA VACCINE  06/18/2020  . HEMOGLOBIN A1C  11/01/2020  . OPHTHALMOLOGY EXAM  12/23/2020  . FOOT EXAM  05/02/2021  . URINE MICROALBUMIN  05/02/2021  . TETANUS/TDAP  04/09/2030  . PNA vac Low Risk Adult  Completed    Dental Screening: Recommended annual dental exams for proper oral hygiene. Dentures.   Community Resource Referral / Chronic Care Management: CRR required this visit?  No   CCM required this visit?  No      Plan:   Keep all  routine maintenance appointments.   Follow up 11/01/20 @ 10:30.  I have  personally reviewed and noted the following in the patient's chart:   . Medical and social history . Use of alcohol, tobacco or illicit drugs  . Current medications and supplements . Functional ability and status . Nutritional status . Physical activity . Advanced directives . List of other physicians . Hospitalizations, surgeries, and ER visits in previous 12 months . Vitals . Screenings to include cognitive, depression, and falls . Referrals and appointments  In addition, I have reviewed and discussed with patient certain preventive protocols, quality metrics, and best practice recommendations. A written personalized care plan for preventive services as well as general preventive health recommendations were provided to patient via mychart.     Varney Biles, LPN   6/68/1594

## 2020-07-18 DIAGNOSIS — M25511 Pain in right shoulder: Secondary | ICD-10-CM | POA: Diagnosis not present

## 2020-08-16 ENCOUNTER — Other Ambulatory Visit: Payer: Self-pay | Admitting: Family Medicine

## 2020-08-16 ENCOUNTER — Other Ambulatory Visit: Payer: Self-pay | Admitting: Cardiovascular Disease

## 2020-08-16 NOTE — Telephone Encounter (Signed)
Refill request

## 2020-08-16 NOTE — Telephone Encounter (Signed)
Pt's age 86, wt 78.5 kg, SCr 1.63, CrCl 32.78, last ov w/ MA 03/28/20.

## 2020-09-13 DIAGNOSIS — D631 Anemia in chronic kidney disease: Secondary | ICD-10-CM | POA: Diagnosis not present

## 2020-09-13 DIAGNOSIS — E1122 Type 2 diabetes mellitus with diabetic chronic kidney disease: Secondary | ICD-10-CM | POA: Diagnosis not present

## 2020-09-13 DIAGNOSIS — I129 Hypertensive chronic kidney disease with stage 1 through stage 4 chronic kidney disease, or unspecified chronic kidney disease: Secondary | ICD-10-CM | POA: Diagnosis not present

## 2020-09-13 DIAGNOSIS — N1832 Chronic kidney disease, stage 3b: Secondary | ICD-10-CM | POA: Diagnosis not present

## 2020-09-19 ENCOUNTER — Other Ambulatory Visit: Payer: Self-pay | Admitting: Family Medicine

## 2020-09-28 ENCOUNTER — Other Ambulatory Visit: Payer: Self-pay

## 2020-09-28 ENCOUNTER — Ambulatory Visit: Payer: Medicare HMO | Admitting: Cardiovascular Disease

## 2020-09-28 ENCOUNTER — Encounter: Payer: Self-pay | Admitting: Cardiovascular Disease

## 2020-09-28 VITALS — BP 110/70 | HR 72 | Ht 68.0 in | Wt 166.2 lb

## 2020-09-28 DIAGNOSIS — E785 Hyperlipidemia, unspecified: Secondary | ICD-10-CM | POA: Diagnosis not present

## 2020-09-28 DIAGNOSIS — I4892 Unspecified atrial flutter: Secondary | ICD-10-CM | POA: Diagnosis not present

## 2020-09-28 DIAGNOSIS — I10 Essential (primary) hypertension: Secondary | ICD-10-CM | POA: Diagnosis not present

## 2020-09-28 DIAGNOSIS — I2510 Atherosclerotic heart disease of native coronary artery without angina pectoris: Secondary | ICD-10-CM

## 2020-09-28 MED ORDER — ATORVASTATIN CALCIUM 40 MG PO TABS: 40.0000 mg | ORAL_TABLET | Freq: Every day | ORAL | 3 refills | Status: DC

## 2020-09-28 MED ORDER — APIXABAN 2.5 MG PO TABS: 2.5000 mg | ORAL_TABLET | Freq: Two times a day (BID) | ORAL | 6 refills | Status: DC

## 2020-09-28 NOTE — Progress Notes (Signed)
Cardiology Office Note   Date:  09/28/2020   ID:  Devin Reese, DOB 1927/04/08, MRN [id number]  PCP:  Devin Haven, MD  Cardiologist:   Kathlyn Sacramento, MD   Chief Complaint  Patient presents with   OTHER    6 month f/u no complaints today. Meds reviewed verbally with pt.      History of Present Illness: Devin Reese is a 86 y.o. male who presents for a followup visit  He has known history of coronary artery disease status post CABG in 1991 at [facility]. He was diagnosed with atrial flutter in 2014 with subsequent successful cardioversion. No recurrent arrhythmia since then.  Most recent echocardiogram in 2018 showed mildly reduced LV systolic function with an EF of 40 to 45% with inferior wall hypokinesis, mild mitral and aortic regurgitation. Carvedilol was discontinued recently due to bradycardia.  Lexiscan Myoview in August 2018 showed prior inferior scar with no significant ischemia.  He fell in May and injured his right elbow but did not require surgery.  He has been doing well since then.  He denies any chest pain or shortness of breath.  No palpitations.  He takes anticoagulation with Eliquis with no reported side effects.  Chronic kidney disease is stable recent labs showing creatinine of 1.68.   Past Medical History:  Diagnosis Date   Atrial flutter ([place])    CAD (coronary artery disease)    CKD (chronic kidney disease), stage II    COPD (chronic obstructive pulmonary disease) ([place])    Coronary artery disease    CABG in 1991 at [facility]. Most recent cardiac catheterization in 6213 was complicated by stroke.   Diabetes mellitus without complication ([place])    Hyperlipidemia    Hypertension    Prostate cancer ([place])    Stroke [facility])     Past Surgical History:  Procedure Laterality Date   CARDIAC CATHETERIZATION     CARDIAC SURGERY     CARDIOVERSION  03/29/13   CORONARY ARTERY BYPASS GRAFT       Current Outpatient Medications   Medication Sig Dispense Refill   albuterol (PROVENTIL HFA;VENTOLIN HFA) 108 (90 Base) MCG/ACT inhaler Inhale 1-2 puffs into the lungs every 4 (four) hours as needed for wheezing or shortness of breath. 1 Inhaler 2   atorvastatin (LIPITOR) 40 MG tablet Take 1 tablet (40 mg total) by mouth daily. 90 tablet 3   ELIQUIS 2.5 MG TABS tablet Take 1 tablet (2.5 mg total) by mouth 2 (two) times daily. 60 tablet 6   Fluticasone-Umeclidin-Vilant (TRELEGY ELLIPTA IN) Inhale 1 puff into the lungs daily.     levothyroxine (SYNTHROID) 25 MCG tablet Take 1 tablet (25 mcg total) by mouth daily before breakfast. 90 tablet 0   nitroGLYCERIN (NITROSTAT) 0.4 MG SL tablet Place 1 tablet (0.4 mg total) under the tongue every 5 (five) minutes as needed for chest pain. 25 tablet 3   TRADJENTA 5 MG TABS tablet Take 1 tablet (5 mg total) by mouth daily. 30 tablet 0   No current facility-administered medications for this visit.    Allergies:   Patient has no known allergies.    Social History:  The patient  reports that he has quit smoking. His smoking use included cigarettes. He has a 25.00 pack-year smoking history. He has never used smokeless tobacco. He reports that he does not drink alcohol and does not use drugs.   Family History:  The patient's family history includes Cancer in an other family member;  Coronary artery disease in his father and another family member; Diabetes in his brother; Heart attack in his brother; Heart disease in his father.    ROS:  Please see the history of present illness.   Otherwise, review of systems are positive for none.   All other systems are reviewed and negative.    PHYSICAL EXAM: VS:  BP 110/70 (BP Location: Left Arm, Patient Position: Sitting, Cuff Size: Normal)    Pulse 72    Ht 5\' 8"  (1.727 m)    Wt 166 lb 4 oz (75.4 kg)    SpO2 98%    BMI 25.28 kg/m  , BMI Body mass index is 25.28 kg/m. GEN: Well nourished, well developed, in no acute distress  HEENT: normal   Neck: no JVD, carotid bruits, or masses Cardiac: RRR with premature beats; no murmurs, rubs, or gallops,no edema  Respiratory:  clear to auscultation bilaterally, normal work of breathing GI: soft, nontender, nondistended, + BS MS: no deformity or atrophy  Skin: warm and dry, no rash Neuro:  Strength and sensation are intact Psych: euthymic mood, full affect   EKG:  EKG is ordered today. The ekg ordered today demonstrates sinus rhythm with occasional PVC and possible old inferior infarct.  Recent Labs: 05/02/2020: ALT 9; BUN 26; Creatinine, Ser 1.63; Hemoglobin 12.3; Platelets 199.0; Potassium 4.1; Sodium 138; TSH 0.97    Lipid Panel    Component Value Date/Time   CHOL 132 05/02/2020 1215   CHOL 143 03/20/2017 0826   TRIG 134.0 05/02/2020 1215   HDL 33.10 (L) 05/02/2020 1215   HDL 40 03/20/2017 0826   CHOLHDL 4 05/02/2020 1215   VLDL 26.8 05/02/2020 1215   LDLCALC 72 05/02/2020 1215   LDLCALC 84 03/20/2017 0826      Wt Readings from Last 3 Encounters:  09/28/20 166 lb 4 oz (75.4 kg)  07/17/20 173 lb (78.5 kg)  05/02/20 173 lb 6.4 oz (78.7 kg)         ASSESSMENT AND PLAN:  1.  Paroxysmal atrial flutter: No evidence of recurrent arrhythmia and heart rate seems to be controlled.   Continue long-term anticoagulation with low-dose Eliquis given age and chronic kidney disease.  Most recent labs showed stable anemia and chronic kidney disease.  Creatinine is above 1.5 and thus we will continue with low-dose Eliquis.  2. Coronary artery disease involving native coronary arteries with stable angina:  He reports no anginal symptoms at the present time.   He is not on a beta-blocker due to chronic bradycardia.  3. Essential hypertension: Blood pressure is controlled without medications.  4. Hyperlipidemia: Continue atorvastatin 40 mg once daily.   Most recent lipid profile showed an LDL of 77.     Disposition:   FU with me in 6 months  Signed,  Kathlyn Sacramento, MD   09/28/2020 2:22 PM    [facility]

## 2020-09-28 NOTE — Patient Instructions (Signed)
Medication Instructions:  Your physician recommends that you continue on your current medications as directed. Please refer to the Current Medication list given to you today.  Eliquis and Lipitor has been refilled.  *If you need a refill on your cardiac medications before your next appointment, please call your pharmacy*   Lab Work: None ordered If you have labs (blood work) drawn today and your tests are completely normal, you will receive your results only by: Marland Kitchen MyChart Message (if you have MyChart) OR . A paper copy in the mail If you have any lab test that is abnormal or we need to change your treatment, we will call you to review the results.   Testing/Procedures: None ordered   Follow-Up: At [facility], you and your health needs are our priority.  As part of our continuing mission to provide you with exceptional heart care, we have created designated Provider Care Teams.  These Care Teams include your primary Cardiologist (physician) and Advanced Practice Providers (APPs -  Physician Assistants and Nurse Practitioners) who all work together to provide you with the care you need, when you need it.  We recommend signing up for the patient portal called "MyChart".  Sign up information is provided on this After Visit Summary.  MyChart is used to connect with patients for Virtual Visits (Telemedicine).  Patients are able to view lab/test results, encounter notes, upcoming appointments, etc.  Non-urgent messages can be sent to your provider as well.   To learn more about what you can do with MyChart, go to [email or web address].    Your next appointment:   6 month(s)  The format for your next appointment:   In Person  Provider:   You may see Kathlyn Sacramento, MD or one of the following Advanced Practice Providers on your designated Care Team:    Murray Hodgkins, NP  Christell Faith, PA-C  Marrianne Mood, PA-C  Cadence Kathlen Mody, [place]    Other Instructions N/A

## 2020-10-20 DIAGNOSIS — R69 Illness, unspecified: Secondary | ICD-10-CM | POA: Diagnosis not present

## 2020-10-24 ENCOUNTER — Ambulatory Visit (INDEPENDENT_AMBULATORY_CARE_PROVIDER_SITE_OTHER): Payer: Medicare HMO

## 2020-10-24 ENCOUNTER — Ambulatory Visit
Admission: EM | Admit: 2020-10-24 | Discharge: 2020-10-24 | Disposition: A | Discharge status: Home / Self Care | Payer: Medicare HMO | Attending: Family Medicine | Admitting: Family Medicine

## 2020-10-24 ENCOUNTER — Telehealth: Payer: Self-pay | Admitting: Family Medicine

## 2020-10-24 ENCOUNTER — Encounter: Payer: Self-pay | Admitting: Emergency Medicine

## 2020-10-24 ENCOUNTER — Other Ambulatory Visit: Payer: Self-pay | Admitting: Family Medicine

## 2020-10-24 ENCOUNTER — Ambulatory Visit: Payer: Medicare HMO

## 2020-10-24 ENCOUNTER — Other Ambulatory Visit: Payer: Self-pay

## 2020-10-24 DIAGNOSIS — R059 Cough, unspecified: Secondary | ICD-10-CM

## 2020-10-24 DIAGNOSIS — R062 Wheezing: Secondary | ICD-10-CM | POA: Diagnosis not present

## 2020-10-24 DIAGNOSIS — R0602 Shortness of breath: Secondary | ICD-10-CM | POA: Diagnosis not present

## 2020-10-24 DIAGNOSIS — J209 Acute bronchitis, unspecified: Secondary | ICD-10-CM

## 2020-10-24 IMAGING — DX DG CHEST 2V
2 series · 2 of 2 positions shown · non-contrast
Comparison: Chest x-ray dated [DATE].

CLINICAL DATA: Productive cough, shortness of breath, and wheezing
for the past 3 days.

EXAM:
CHEST - 2 VIEW

[chest pa]
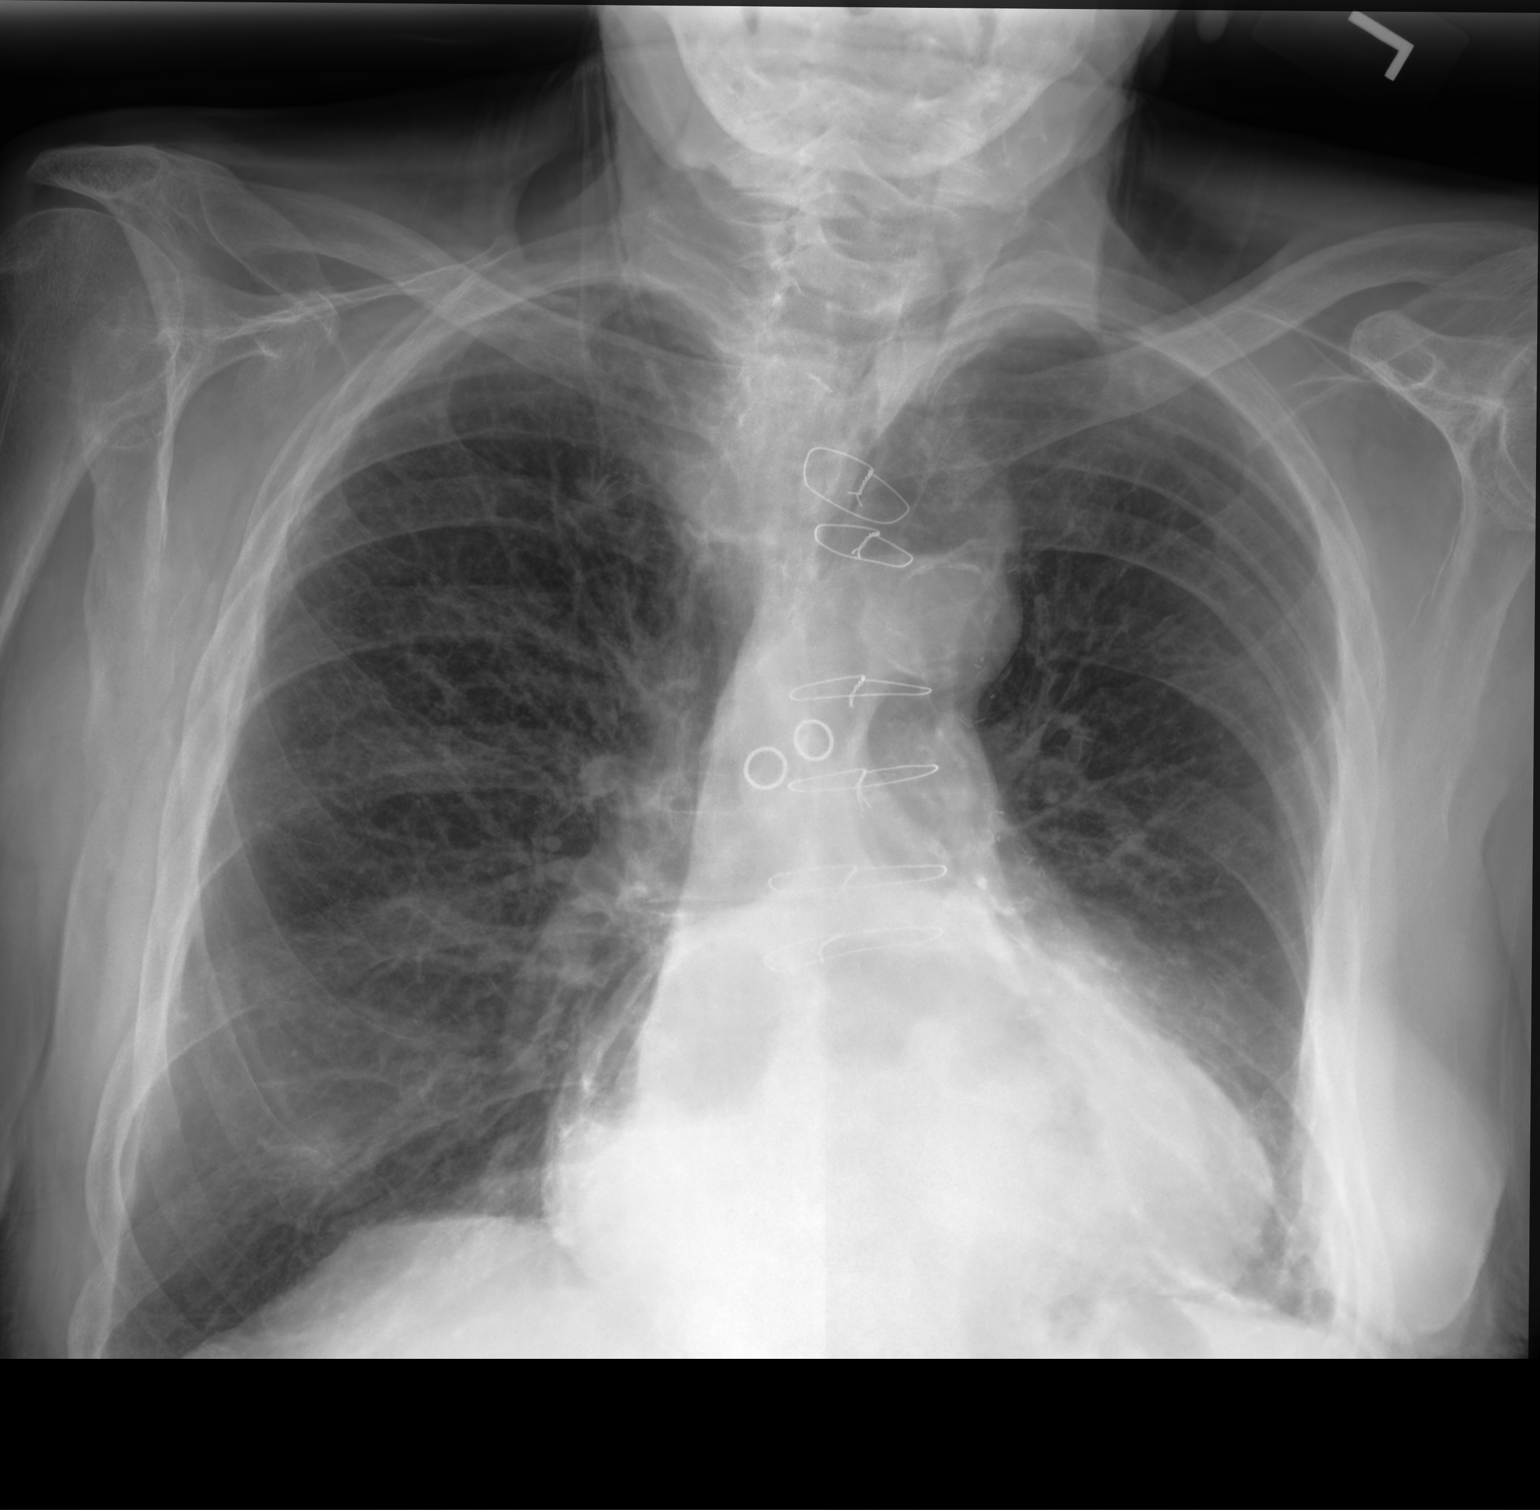

[chest lat]
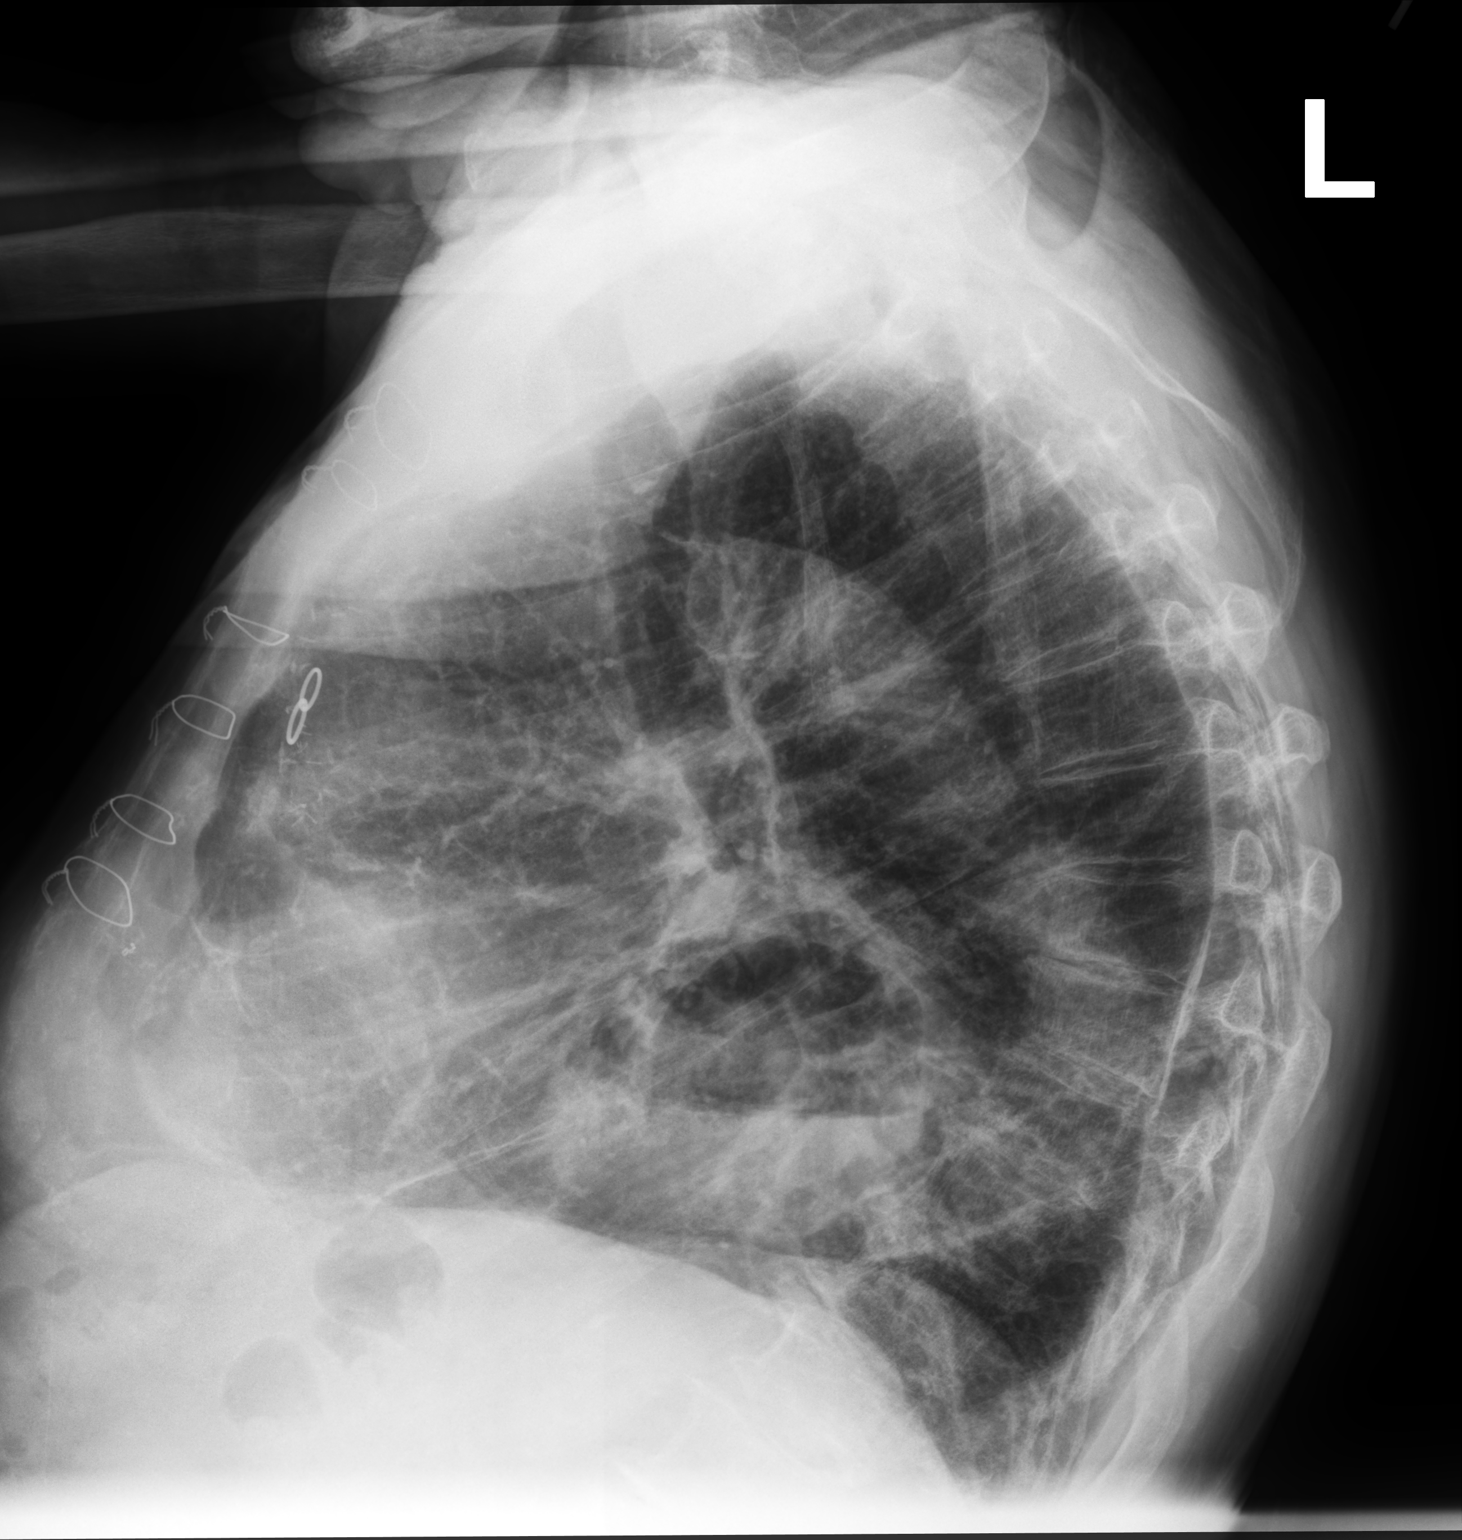

[2 of 2 positions shown; findings below may reference images not displayed]

FINDINGS: Stable mild cardiomegaly status post CABG. Normal pulmonary
vascularity. Unchanged scarring at the left lung base. No focal
consolidation, pleural effusion, or pneumothorax. No acute osseous
abnormality. Unchanged large hiatal hernia.
IMPRESSION: 1. No acute cardiopulmonary disease.

## 2020-10-24 MED ORDER — AMOXICILLIN 500 MG PO CAPS
1000.0000 mg | ORAL_CAPSULE | Freq: Two times a day (BID) | ORAL | 0 refills | Status: AC
Start: 2020-10-24 — End: 2020-10-29

## 2020-10-24 MED ORDER — ALBUTEROL SULFATE HFA 108 (90 BASE) MCG/ACT IN AERS: 1.0000 | INHALATION_SPRAY | RESPIRATORY_TRACT | 0 refills | Status: DC | PRN

## 2020-10-24 MED ORDER — PREDNISONE 10 MG PO TABS
20.0000 mg | ORAL_TABLET | Freq: Every day | ORAL | 0 refills | Status: AC
Start: 2020-10-24 — End: 2020-10-29

## 2020-10-24 NOTE — Telephone Encounter (Signed)
LVM for the patient to return a call to explain why he cannot come into the office with covid symptoms.  Aunesty Tyson,cma

## 2020-10-24 NOTE — Discharge Instructions (Addendum)
Your x ray was normal Treating you for bronchitis based on your symptoms and your lung sounds. Refilled your albuterol inhaler to use as needed.  Amoxicillin twice a day for 5  days Prednisone daily for 5 days with food. Continue the mucinex  We are sending a swab for Covid and flu we will call you if this is positive. Follow up as needed for continued or worsening symptoms

## 2020-10-24 NOTE — ED Triage Notes (Addendum)
Patient c/o productive cough w/ "clear" sputum and fatigue since  Saturday night.   Patient denies fever at home.   Patient has taken Mucinex and Robitussin w/ some relief of symptoms.   History of COPD.

## 2020-10-24 NOTE — Telephone Encounter (Signed)
Pt wife called and said that he is very fatigued and has a cough she is going to call [facility] to see if they have any openings  Pt has an appt on 12/15 and wants to come in for the appt.Bonne Dolores to explain to pt wife that he wouldn't be able to come in for appt  They are requesting a call back

## 2020-10-25 NOTE — ED Provider Notes (Signed)
Roderic Palau    CSN: [id number] Arrival date & time: 10/24/20  1352      History   Chief Complaint Chief Complaint  Patient presents with  . Cough  . Fatigue    HPI Devin Reese is a 86 y.o. male.   Patient is a 86 year old male with past medical history of A. fib, CAD, CKD, COPD, diabetes, hyperlipidemia, hypertension, prostate cancer and stroke.  He presents today with productive cough with clear sputum and fatigue since Saturday.  Has been taking Mucinex and Robitussin with some relief of symptoms.  No lower extremity edema.  Mild shortness of breath.  He is out of his albuterol inhaler but has been using his daily steroid inhaler.  No fever, chills, body aches, dizziness.      Past Medical History:  Diagnosis Date  . Atrial flutter ([place])   . CAD (coronary artery disease)   . CKD (chronic kidney disease), stage II   . COPD (chronic obstructive pulmonary disease) ([place])   . Coronary artery disease    CABG in 1991 at [facility]. Most recent cardiac catheterization in 2841 was complicated by stroke.  . Diabetes mellitus without complication ([place])   . Hyperlipidemia   . Hypertension   . Prostate cancer ([place])   . Stroke [facility])     Patient Active Problem List   Diagnosis Date Noted  . Rotator cuff tear, right 05/02/2020  . History of prostate cancer 06/07/2019  . Difficulty hearing 06/07/2019  . Fall 06/07/2019  . Chronic anticoagulation 06/04/2018  . AK (actinic keratosis) 12/02/2017  . COPD (chronic obstructive pulmonary disease) ([place]) 08/20/2017  . Hypothyroidism 08/20/2017  . Anisocoria 07/07/2017  . Memory difficulty 07/07/2017  . CKD (chronic kidney disease), stage III ([place]) 07/02/2017  . Diabetes ([place]) 07/01/2017  . Atrial flutter ([place]) 02/25/2013  . Coronary artery disease   . Hypertension   . Hyperlipidemia   . OLD MYOCARDIAL INFARCTION 02/20/2010  . CAD, ARTERY BYPASS GRAFT 09/14/2009    Past Surgical History:  Procedure Laterality Date   . CARDIAC CATHETERIZATION    . CARDIAC SURGERY    . CARDIOVERSION  03/29/13  . CORONARY ARTERY BYPASS GRAFT         Home Medications    Prior to Admission medications   Medication Sig Start Date End Date Taking? Authorizing Provider  apixaban (ELIQUIS) 2.5 MG TABS tablet Take 1 tablet (2.5 mg total) by mouth 2 (two) times daily. 09/28/20  Yes Wellington Hampshire, MD  atorvastatin (LIPITOR) 40 MG tablet Take 1 tablet (40 mg total) by mouth daily. 09/28/20 12/27/20 Yes Wellington Hampshire, MD  Fluticasone-Umeclidin-Vilant (TRELEGY ELLIPTA IN) Inhale 1 puff into the lungs daily.   Yes [provider]  levothyroxine (SYNTHROID) 25 MCG tablet Take 1 tablet (25 mcg total) by mouth daily before breakfast. 10/24/20  Yes Sonnenberg, Angela Adam, MD  TRADJENTA 5 MG TABS tablet Take 1 tablet (5 mg total) by mouth daily. 10/24/20  Yes Leone Haven, MD  albuterol (VENTOLIN HFA) 108 (90 Base) MCG/ACT inhaler Inhale 1-2 puffs into the lungs every 4 (four) hours as needed for wheezing or shortness of breath. 10/24/20   Loura Halt A, NP  amoxicillin (AMOXIL) 500 MG capsule Take 2 capsules (1,000 mg total) by mouth 2 (two) times daily for 5 days. 10/24/20 10/29/20  Loura Halt A, NP  nitroGLYCERIN (NITROSTAT) 0.4 MG SL tablet Place 1 tablet (0.4 mg total) under the tongue every 5 (five) minutes as needed for chest  pain. 03/28/20   Wellington Hampshire, MD  predniSONE (DELTASONE) 10 MG tablet Take 2 tablets (20 mg total) by mouth daily for 5 days. 10/24/20 10/29/20  Orvan July, NP    Family History Family History  Problem Relation Age of Onset  . Heart disease Father   . Coronary artery disease Father   . Cancer Other        family hx  . Coronary artery disease Other        family hx   . Diabetes Brother   . Heart attack Brother     Social History Social History   Tobacco Use  . Smoking status: Former Smoker    Packs/day: 1.00    Years: 25.00    Pack years: 25.00    Types: Cigarettes  .  Smokeless tobacco: Never Used  Vaping Use  . Vaping Use: Never used  Substance Use Topics  . Alcohol use: No    Comment: Rare  . Drug use: No     Allergies   Patient has no known allergies.   Review of Systems Review of Systems   Physical Exam Triage Vital Signs ED Triage Vitals  Enc Vitals Group     BP 10/24/20 1443 105/68     Pulse Rate 10/24/20 1443 72     Resp 10/24/20 1443 20     Temp 10/24/20 1443 97.6 F (36.4 C)     Temp Source 10/24/20 1443 Oral     SpO2 10/24/20 1443 90 %     Weight --      Height --      Head Circumference --      Peak Flow --      Pain Score 10/24/20 1441 0     Pain Loc --      Pain Edu? --      Excl. in [place]? --    No data found.  Updated Vital Signs BP 105/68 (BP Location: Left Arm)   Pulse 72   Temp 97.6 F (36.4 C) (Oral)   Resp 20   SpO2 90%   Visual Acuity Right Eye Distance:   Left Eye Distance:   Bilateral Distance:    Right Eye Near:   Left Eye Near:    Bilateral Near:     Physical Exam Vitals and nursing note reviewed.  Constitutional:      Appearance: Normal appearance.  HENT:     Head: Normocephalic and atraumatic.     Right Ear: Tympanic membrane and ear canal normal.     Left Ear: Tympanic membrane and ear canal normal.     Nose: Nose normal.     Mouth/Throat:     Pharynx: Oropharynx is clear.  Eyes:     Conjunctiva/sclera: Conjunctivae normal.     Pupils: Pupils are equal, round, and reactive to light.  Cardiovascular:     Rate and Rhythm: Normal rate and regular rhythm.  Pulmonary:     Effort: Pulmonary effort is normal.     Breath sounds: Wheezing and rhonchi present.     Comments: Rhonchi and wheezes throughout lungs bilateral.  Musculoskeletal:        General: Normal range of motion.     Cervical back: Normal range of motion.  Skin:    General: Skin is warm and dry.  Neurological:     General: No focal deficit present.     Mental Status: He is alert.  Psychiatric:        Mood and  Affect: Mood normal.      UC Treatments / Results  Labs (all labs ordered are listed, but only abnormal results are displayed) Labs Reviewed  NOVEL CORONAVIRUS, NAA    EKG   Radiology DG Chest 2 View  Result Date: 10/24/2020 CLINICAL DATA:  Productive cough, shortness of breath, and wheezing for the past 3 days. EXAM: CHEST - 2 VIEW COMPARISON:  Chest x-ray dated July 01, 2017. FINDINGS: Stable mild cardiomegaly status post CABG. Normal pulmonary vascularity. Unchanged scarring at the left lung base. No focal consolidation, pleural effusion, or pneumothorax. No acute osseous abnormality. Unchanged large hiatal hernia. IMPRESSION: 1. No acute cardiopulmonary disease. Electronically Signed   By: Titus Dubin M.D.   On: 10/24/2020 15:37    Procedures Procedures (including critical care time)  Medications Ordered in UC Medications - No data to display  Initial Impression / Assessment and Plan / UC Course  I have reviewed the triage vital signs and the nursing notes.  Pertinent labs & imaging results that were available during my care of the patient were reviewed by me and considered in my medical decision making (see chart for details).     Acute bronchitis Lungs with rhonchi and wheezing.  No dyspnea or distress at this time.  Oxygen saturations around 90%.  Patient with history of COPD. X-ray without any acute concerns or findings. Regarding treat today with prednisone, low-dose daily with food for 5 days. Albuterol inhaler as needed for cough, wheezing or shortness of breath. Amoxicillin twice a day for 5 days for possible underlying infection Continue Mucinex Covid and flu swab pending. Follow up as needed for continued or worsening symptoms  Final Clinical Impressions(s) / UC Diagnoses   Final diagnoses:  Cough  Acute bronchitis, unspecified organism     Discharge Instructions     Your x ray was normal Treating you for bronchitis based on your symptoms and  your lung sounds. Refilled your albuterol inhaler to use as needed.  Amoxicillin twice a day for 5  days Prednisone daily for 5 days with food. Continue the mucinex  We are sending a swab for Covid and flu we will call you if this is positive. Follow up as needed for continued or worsening symptoms     ED Prescriptions    Medication Sig Dispense Auth. Provider   albuterol (VENTOLIN HFA) 108 (90 Base) MCG/ACT inhaler Inhale 1-2 puffs into the lungs every 4 (four) hours as needed for wheezing or shortness of breath. 1 each Kyren Knick A, NP   amoxicillin (AMOXIL) 500 MG capsule Take 2 capsules (1,000 mg total) by mouth 2 (two) times daily for 5 days. 20 capsule Draven Natter A, NP   predniSONE (DELTASONE) 10 MG tablet Take 2 tablets (20 mg total) by mouth daily for 5 days. 10 tablet Loura Halt A, NP     PDMP not reviewed this encounter.   Orvan July, NP 10/25/20 [phone]

## 2020-10-25 NOTE — Telephone Encounter (Signed)
The patient did go to the UC and was seen.  I explained to the patient's wife why the patient was not allowed to have a in-person visit and she understood. Patient's visit was changed to a telephone visit with the provider.  Ninac,ma

## 2020-10-26 LAB — NOVEL CORONAVIRUS, NAA: SARS-CoV-2, NAA: NOT DETECTED

## 2020-10-26 LAB — SARS-COV-2, NAA 2 DAY TAT

## 2020-11-01 ENCOUNTER — Other Ambulatory Visit: Payer: Self-pay

## 2020-11-01 ENCOUNTER — Encounter: Payer: Self-pay | Admitting: Family Medicine

## 2020-11-01 ENCOUNTER — Ambulatory Visit (INDEPENDENT_AMBULATORY_CARE_PROVIDER_SITE_OTHER): Payer: Medicare HMO | Admitting: Family Medicine

## 2020-11-01 DIAGNOSIS — E119 Type 2 diabetes mellitus without complications: Secondary | ICD-10-CM

## 2020-11-01 DIAGNOSIS — J989 Respiratory disorder, unspecified: Secondary | ICD-10-CM | POA: Diagnosis not present

## 2020-11-01 DIAGNOSIS — S46011D Strain of muscle(s) and tendon(s) of the rotator cuff of right shoulder, subsequent encounter: Secondary | ICD-10-CM | POA: Diagnosis not present

## 2020-11-01 DIAGNOSIS — E785 Hyperlipidemia, unspecified: Secondary | ICD-10-CM

## 2020-11-01 NOTE — Assessment & Plan Note (Signed)
We will have him come in in several weeks for an A1c.  Continue Tradjenta 5 mg daily.

## 2020-11-01 NOTE — Assessment & Plan Note (Signed)
Discussed that there is not anything significant that we can do at this time to help resolve this issue.  He has completed conservative therapy and he notes he was advised that he was not a surgical candidate.  Discussed taking Tylenol over-the-counter on an as-needed basis.  If he has any worsening symptoms we could refer back to orthopedics.

## 2020-11-01 NOTE — Assessment & Plan Note (Signed)
-  Continue Lipitor 40 mg daily ?

## 2020-11-01 NOTE — Progress Notes (Signed)
Virtual Visit via telephone Note  This visit type was conducted due to national recommendations for restrictions regarding the COVID-19 pandemic (e.g. social distancing).  This format is felt to be most appropriate for this patient at this time.  All issues noted in this document were discussed and addressed.  No physical exam was performed (except for noted visual exam findings with Video Visits).   I connected with Devin Reese today at 10:30 AM EST by a video enabled telemedicine application or telephone and verified that I am speaking with the correct person using two identifiers. Location patient: home Location provider: work Persons participating in the virtual visit: patient, provider, Pasha Broad (wife)  I discussed the limitations, risks, security and privacy concerns of performing an evaluation and management service by telephone and the availability of in person appointments. I also discussed with the patient that there may be a patient responsible charge related to this service. The patient expressed understanding and agreed to proceed.  Interactive audio and video telecommunications were attempted between this provider and patient, however failed, due to patient having technical difficulties OR patient did not have access to video capability.  We continued and completed visit with audio only.   Reason for visit: f/u.  HPI: DIABETES Disease Monitoring: Blood Sugar ranges-145 this morning Polyuria/phagia/dipsia- no      Optho- UTD Medications: Compliance- taking tradjenta Hypoglycemic symptoms- no  HYPERLIPIDEMIA Symptoms Chest pain on exertion:  no  Medications: Compliance- taking Lipitor right upper quadrant pain- no  Muscle aches- no  Right shoulder pain: This is a chronic issue.  He has a suspected right rotator cuff tear.  He saw orthopedics previously and notes they told him is too old for surgery.  He did physical therapy with little benefit.  The patient notes the  shoulder still occasionally bothers him though is quite a bit better than it was previously.  He occasionally takes Tylenol over-the-counter for this.  Cough: Patient was evaluated in urgent care and placed on prednisone and amoxicillin.  He had a negative COVID-19 test.  He notes his cough is very mild now.  Some mild rhinorrhea.  His breathing has improved significantly.  He was able to work in the yard yesterday with no issues.   ROS: See pertinent positives and negatives per HPI.  Past Medical History:  Diagnosis Date  . Atrial flutter ([place])   . CAD (coronary artery disease)   . CKD (chronic kidney disease), stage II   . COPD (chronic obstructive pulmonary disease) ([place])   . Coronary artery disease    CABG in 1991 at [facility]. Most recent cardiac catheterization in 7169 was complicated by stroke.  . Diabetes mellitus without complication ([place])   . Hyperlipidemia   . Hypertension   . Prostate cancer ([place])   . Stroke [facility])     Past Surgical History:  Procedure Laterality Date  . CARDIAC CATHETERIZATION    . CARDIAC SURGERY    . CARDIOVERSION  03/29/13  . CORONARY ARTERY BYPASS GRAFT      Family History  Problem Relation Age of Onset  . Heart disease Father   . Coronary artery disease Father   . Cancer Other        family hx  . Coronary artery disease Other        family hx   . Diabetes Brother   . Heart attack Brother     SOCIAL HX: Former smoker   Current Outpatient Medications:  .  albuterol (VENTOLIN HFA)  108 (90 Base) MCG/ACT inhaler, Inhale 1-2 puffs into the lungs every 4 (four) hours as needed for wheezing or shortness of breath., Disp: 1 each, Rfl: 0 .  apixaban (ELIQUIS) 2.5 MG TABS tablet, Take 1 tablet (2.5 mg total) by mouth 2 (two) times daily., Disp: 60 tablet, Rfl: 6 .  atorvastatin (LIPITOR) 40 MG tablet, Take 1 tablet (40 mg total) by mouth daily., Disp: 90 tablet, Rfl: 3 .  Fluticasone-Umeclidin-Vilant (TRELEGY ELLIPTA IN), Inhale 1 puff into the lungs  daily., Disp: , Rfl:  .  levothyroxine (SYNTHROID) 25 MCG tablet, Take 1 tablet (25 mcg total) by mouth daily before breakfast., Disp: 90 tablet, Rfl: 0 .  nitroGLYCERIN (NITROSTAT) 0.4 MG SL tablet, Place 1 tablet (0.4 mg total) under the tongue every 5 (five) minutes as needed for chest pain., Disp: 25 tablet, Rfl: 3 .  TRADJENTA 5 MG TABS tablet, Take 1 tablet (5 mg total) by mouth daily., Disp: 30 tablet, Rfl: 0  EXAM: This was a telehealth visit and thus no physical exam was completed.  ASSESSMENT AND PLAN:  Discussed the following assessment and plan:  Problem List Items Addressed This Visit    Diabetes ([place])    We will have him come in in several weeks for an A1c.  Continue Tradjenta 5 mg daily.      Hyperlipidemia    Continue Lipitor 40 mg daily.      Respiratory illness    Patient is much improved.  He will monitor for any worsening symptoms.      Rotator cuff tear, right    Discussed that there is not anything significant that we can do at this time to help resolve this issue.  He has completed conservative therapy and he notes he was advised that he was not a surgical candidate.  Discussed taking Tylenol over-the-counter on an as-needed basis.  If he has any worsening symptoms we could refer back to orthopedics.          I discussed the assessment and treatment plan with the patient. The patient was provided an opportunity to ask questions and all were answered. The patient agreed with the plan and demonstrated an understanding of the instructions.   The patient was advised to call back or seek an in-person evaluation if the symptoms worsen or if the condition fails to improve as anticipated.  I provided 11 minutes of non-face-to-face time during this encounter.   [name], MD

## 2020-11-01 NOTE — Assessment & Plan Note (Signed)
Patient is much improved.  He will monitor for any worsening symptoms.

## 2020-11-14 ENCOUNTER — Other Ambulatory Visit: Payer: Self-pay | Admitting: Family Medicine

## 2020-12-19 ENCOUNTER — Other Ambulatory Visit: Payer: Self-pay | Admitting: Family Medicine

## 2020-12-25 DIAGNOSIS — E119 Type 2 diabetes mellitus without complications: Secondary | ICD-10-CM | POA: Diagnosis not present

## 2020-12-25 LAB — HM DIABETES EYE EXAM

## 2021-01-22 ENCOUNTER — Other Ambulatory Visit: Payer: Self-pay | Admitting: Family Medicine

## 2021-02-26 ENCOUNTER — Other Ambulatory Visit: Payer: Self-pay | Admitting: Family Medicine

## 2021-03-14 DIAGNOSIS — E1122 Type 2 diabetes mellitus with diabetic chronic kidney disease: Secondary | ICD-10-CM | POA: Diagnosis not present

## 2021-03-14 DIAGNOSIS — R600 Localized edema: Secondary | ICD-10-CM | POA: Diagnosis not present

## 2021-03-14 DIAGNOSIS — N1832 Chronic kidney disease, stage 3b: Secondary | ICD-10-CM | POA: Diagnosis not present

## 2021-03-14 DIAGNOSIS — I129 Hypertensive chronic kidney disease with stage 1 through stage 4 chronic kidney disease, or unspecified chronic kidney disease: Secondary | ICD-10-CM | POA: Diagnosis not present

## 2021-03-21 DIAGNOSIS — N1832 Chronic kidney disease, stage 3b: Secondary | ICD-10-CM | POA: Diagnosis not present

## 2021-03-21 DIAGNOSIS — E1122 Type 2 diabetes mellitus with diabetic chronic kidney disease: Secondary | ICD-10-CM | POA: Diagnosis not present

## 2021-03-21 DIAGNOSIS — I129 Hypertensive chronic kidney disease with stage 1 through stage 4 chronic kidney disease, or unspecified chronic kidney disease: Secondary | ICD-10-CM | POA: Diagnosis not present

## 2021-03-21 DIAGNOSIS — R600 Localized edema: Secondary | ICD-10-CM | POA: Diagnosis not present

## 2021-03-27 ENCOUNTER — Other Ambulatory Visit: Payer: Self-pay | Admitting: Family Medicine

## 2021-03-30 ENCOUNTER — Other Ambulatory Visit: Payer: Self-pay

## 2021-03-30 ENCOUNTER — Ambulatory Visit: Payer: Medicare HMO | Admitting: Cardiovascular Disease

## 2021-03-30 ENCOUNTER — Encounter: Payer: Self-pay | Admitting: Cardiovascular Disease

## 2021-03-30 VITALS — BP 120/60 | HR 62 | Ht 68.0 in | Wt 169.2 lb

## 2021-03-30 DIAGNOSIS — I4892 Unspecified atrial flutter: Secondary | ICD-10-CM | POA: Diagnosis not present

## 2021-03-30 DIAGNOSIS — I25118 Atherosclerotic heart disease of native coronary artery with other forms of angina pectoris: Secondary | ICD-10-CM

## 2021-03-30 DIAGNOSIS — E785 Hyperlipidemia, unspecified: Secondary | ICD-10-CM

## 2021-03-30 DIAGNOSIS — I10 Essential (primary) hypertension: Secondary | ICD-10-CM

## 2021-03-30 NOTE — Progress Notes (Signed)
Cardiology Office Note   Date:  03/30/2021   ID:  Devin Reese, DOB 1927/03/17, MRN [id number]  PCP:  Leone Haven, MD  Cardiologist:   Kathlyn Sacramento, MD   Chief Complaint  Patient presents with  . Other    6 month f/u no complaints today. Meds reviewed verbally with pt.      History of Present Illness: Devin Reese is a 87 y.o. male who presents for a followup visit  He has known history of coronary artery disease status post CABG in 1991 at [name]. He was diagnosed with atrial flutter in 2014 with subsequent successful cardioversion. No recurrent arrhythmia since then.  Most recent echocardiogram in 2018 showed mildly reduced LV systolic function with an EF of 40 to 45% with inferior wall hypokinesis, mild mitral and aortic regurgitation. Carvedilol was discontinued recently due to bradycardia.  Lexiscan Myoview in August 2018 showed prior inferior scar with no significant ischemia.  He has chronic kidney disease followed by Dr. Candiss Norse.  He has been doing well with no chest pain, shortness of breath or palpitations.  He continues to do his yard work.  No side effects with anticoagulation.   Past Medical History:  Diagnosis Date  . Atrial flutter ([place])   . CAD (coronary artery disease)   . CKD (chronic kidney disease), stage II   . COPD (chronic obstructive pulmonary disease) ([place])   . Coronary artery disease    CABG in 1991 at [facility]. Most recent cardiac catheterization in 5956 was complicated by stroke.  . Diabetes mellitus without complication ([place])   . Hyperlipidemia   . Hypertension   . Prostate cancer ([place])   . Stroke [facility])     Past Surgical History:  Procedure Laterality Date  . CARDIAC CATHETERIZATION    . CARDIAC SURGERY    . CARDIOVERSION  03/29/13  . CORONARY ARTERY BYPASS GRAFT       Current Outpatient Medications  Medication Sig Dispense Refill  . albuterol (VENTOLIN HFA) 108 (90 Base) MCG/ACT inhaler Inhale 1-2 puffs into the  lungs every 4 (four) hours as needed for wheezing or shortness of breath. 1 each 0  . apixaban (ELIQUIS) 2.5 MG TABS tablet Take 1 tablet (2.5 mg total) by mouth 2 (two) times daily. 60 tablet 6  . atorvastatin (LIPITOR) 40 MG tablet Take 1 tablet (40 mg total) by mouth daily. 90 tablet 3  . Fluticasone-Umeclidin-Vilant (TRELEGY ELLIPTA IN) Inhale 1 puff into the lungs daily.    Marland Kitchen levothyroxine (SYNTHROID) 25 MCG tablet Take 1 tablet (25 mcg total) by mouth daily before breakfast. 90 tablet 0  . nitroGLYCERIN (NITROSTAT) 0.4 MG SL tablet Place 1 tablet (0.4 mg total) under the tongue every 5 (five) minutes as needed for chest pain. 25 tablet 3  . TRADJENTA 5 MG TABS tablet Take 1 tablet (5 mg total) by mouth daily. 30 tablet 0   No current facility-administered medications for this visit.    Allergies:   Patient has no known allergies.    Social History:  The patient  reports that he has quit smoking. His smoking use included cigarettes. He has a 25.00 pack-year smoking history. He has never used smokeless tobacco. He reports that he does not drink alcohol and does not use drugs.   Family History:  The patient's family history includes Cancer in an other family member; Coronary artery disease in his father and another family member; Diabetes in his brother; Heart attack in his brother; Heart  disease in his father.    ROS:  Please see the history of present illness.   Otherwise, review of systems are positive for none.   All other systems are reviewed and negative.    PHYSICAL EXAM: VS:  BP 120/60 (BP Location: Left Arm, Patient Position: Sitting, Cuff Size: Normal)   Pulse 62   Ht 5\' 8"  (1.727 m)   Wt 169 lb 4 oz (76.8 kg)   SpO2 98%   BMI 25.73 kg/m  , BMI Body mass index is 25.73 kg/m. GEN: Well nourished, well developed, in no acute distress  HEENT: normal  Neck: no JVD, carotid bruits, or masses Cardiac: RRR with premature beats; no murmurs, rubs, or gallops,no edema   Respiratory:  clear to auscultation bilaterally, normal work of breathing GI: soft, nontender, nondistended, + BS MS: no deformity or atrophy  Skin: warm and dry, no rash Neuro:  Strength and sensation are intact Psych: euthymic mood, full affect   EKG:  EKG is ordered today. The ekg ordered today demonstrates sinus rhythm with first-degree AV block and nonspecific IVCD.  Recent Labs: 05/02/2020: ALT 9; BUN 26; Creatinine, Ser 1.63; Hemoglobin 12.3; Platelets 199.0; Potassium 4.1; Sodium 138; TSH 0.97    Lipid Panel    Component Value Date/Time   CHOL 132 05/02/2020 1215   CHOL 143 03/20/2017 0826   TRIG 134.0 05/02/2020 1215   HDL 33.10 (L) 05/02/2020 1215   HDL 40 03/20/2017 0826   CHOLHDL 4 05/02/2020 1215   VLDL 26.8 05/02/2020 1215   LDLCALC 72 05/02/2020 1215   LDLCALC 84 03/20/2017 0826      Wt Readings from Last 3 Encounters:  03/30/21 169 lb 4 oz (76.8 kg)  11/01/20 166 lb (75.3 kg)  09/28/20 166 lb 4 oz (75.4 kg)         ASSESSMENT AND PLAN:  1.  Paroxysmal atrial flutter: No evidence of recurrent arrhythmia and heart rate seems to be controlled.   Continue long-term anticoagulation with low-dose Eliquis given age and chronic kidney disease.    2. Coronary artery disease involving native coronary arteries with stable angina:  He reports no anginal symptoms at the present time.   He is not on a beta-blocker due to chronic bradycardia.  3. Essential hypertension: Blood pressure is controlled without medications.  4. Hyperlipidemia: Continue atorvastatin 40 mg once daily.   Most recent lipid profile showed an LDL of 77.     Disposition:   FU with me in 6 months  Signed,  Kathlyn Sacramento, MD  03/30/2021 3:37 PM    [facility]

## 2021-03-30 NOTE — Patient Instructions (Signed)

## 2021-04-20 ENCOUNTER — Other Ambulatory Visit: Payer: Self-pay

## 2021-04-23 DIAGNOSIS — X32XXXA Exposure to sunlight, initial encounter: Secondary | ICD-10-CM | POA: Diagnosis not present

## 2021-04-23 DIAGNOSIS — L570 Actinic keratosis: Secondary | ICD-10-CM | POA: Diagnosis not present

## 2021-04-23 DIAGNOSIS — L821 Other seborrheic keratosis: Secondary | ICD-10-CM | POA: Diagnosis not present

## 2021-04-23 DIAGNOSIS — D225 Melanocytic nevi of trunk: Secondary | ICD-10-CM | POA: Diagnosis not present

## 2021-04-23 DIAGNOSIS — D2261 Melanocytic nevi of right upper limb, including shoulder: Secondary | ICD-10-CM | POA: Diagnosis not present

## 2021-04-23 DIAGNOSIS — D2271 Melanocytic nevi of right lower limb, including hip: Secondary | ICD-10-CM | POA: Diagnosis not present

## 2021-04-23 DIAGNOSIS — D2262 Melanocytic nevi of left upper limb, including shoulder: Secondary | ICD-10-CM | POA: Diagnosis not present

## 2021-04-23 DIAGNOSIS — D2272 Melanocytic nevi of left lower limb, including hip: Secondary | ICD-10-CM | POA: Diagnosis not present

## 2021-04-27 ENCOUNTER — Ambulatory Visit (INDEPENDENT_AMBULATORY_CARE_PROVIDER_SITE_OTHER): Payer: Medicare HMO | Admitting: Family Medicine

## 2021-04-27 ENCOUNTER — Other Ambulatory Visit: Payer: Self-pay

## 2021-04-27 ENCOUNTER — Encounter: Payer: Self-pay | Admitting: Family Medicine

## 2021-04-27 DIAGNOSIS — E785 Hyperlipidemia, unspecified: Secondary | ICD-10-CM | POA: Diagnosis not present

## 2021-04-27 DIAGNOSIS — E039 Hypothyroidism, unspecified: Secondary | ICD-10-CM | POA: Diagnosis not present

## 2021-04-27 DIAGNOSIS — E119 Type 2 diabetes mellitus without complications: Secondary | ICD-10-CM

## 2021-04-27 DIAGNOSIS — S46011D Strain of muscle(s) and tendon(s) of the rotator cuff of right shoulder, subsequent encounter: Secondary | ICD-10-CM

## 2021-04-27 DIAGNOSIS — N183 Chronic kidney disease, stage 3 unspecified: Secondary | ICD-10-CM | POA: Diagnosis not present

## 2021-04-27 DIAGNOSIS — I4892 Unspecified atrial flutter: Secondary | ICD-10-CM | POA: Diagnosis not present

## 2021-04-27 LAB — COMPREHENSIVE METABOLIC PANEL
ALT: 11 U/L (ref 0–53)
AST: 16 U/L (ref 0–37)
Albumin: 3.7 g/dL (ref 3.5–5.2)
Alkaline Phosphatase: 86 U/L (ref 39–117)
BUN: 30 mg/dL — ABNORMAL HIGH (ref 6–23)
CO2: 28 mEq/L (ref 19–32)
Calcium: 9.2 mg/dL (ref 8.4–10.5)
Chloride: 105 mEq/L (ref 96–112)
Creatinine, Ser: 1.79 mg/dL — ABNORMAL HIGH (ref 0.40–1.50)
GFR: 32.51 mL/min — ABNORMAL LOW (ref >60.00)
Glucose, Bld: 133 mg/dL — ABNORMAL HIGH (ref 70–99)
Potassium: 4.4 mEq/L (ref 3.5–5.1)
Sodium: 140 mEq/L (ref 135–145)
Total Bilirubin: 0.8 mg/dL (ref 0.2–1.2)
Total Protein: 6.5 g/dL (ref 6.0–8.3)

## 2021-04-27 LAB — LIPID PANEL
Cholesterol: 133 mg/dL (ref 0–200)
HDL: 37.5 mg/dL — ABNORMAL LOW (ref >39.00)
LDL Cholesterol: 75 mg/dL (ref 0–99)
NonHDL: 95.26
Total CHOL/HDL Ratio: 4
Triglycerides: 101 mg/dL (ref 0.0–149.0)
VLDL: 20.2 mg/dL (ref 0.0–40.0)

## 2021-04-27 LAB — CBC
HCT: 35.8 % — ABNORMAL LOW (ref 39.0–52.0)
Hemoglobin: 12.1 g/dL — ABNORMAL LOW (ref 13.0–17.0)
MCHC: 33.8 g/dL (ref 30.0–36.0)
MCV: 94.4 fl (ref 78.0–100.0)
Platelets: 184 10*3/uL (ref 150.0–400.0)
RBC: 3.8 Mil/uL — ABNORMAL LOW (ref 4.22–5.81)
RDW: 14.1 % (ref 11.5–15.5)
WBC: 5.8 10*3/uL (ref 4.0–10.5)

## 2021-04-27 LAB — TSH: TSH: 1.49 u[IU]/mL (ref 0.35–4.50)

## 2021-04-27 LAB — HEMOGLOBIN A1C: Hgb A1c MFr Bld: 7.1 % — ABNORMAL HIGH (ref 4.6–6.5)

## 2021-04-27 NOTE — Assessment & Plan Note (Signed)
Continue Synthroid 25 mg once daily.  Check TSH.

## 2021-04-27 NOTE — Assessment & Plan Note (Signed)
Generally stable.  He will monitor and I advised that they could follow-up with orthopedics in the future if they needed.

## 2021-04-27 NOTE — Assessment & Plan Note (Signed)
Check lipid panel.  Continue Lipitor 40 mg once daily. °

## 2021-04-27 NOTE — Progress Notes (Signed)
[name], MD Phone: [phone]  Devin Reese is a 87 y.o. male who presents today for f/u.  DIABETES Disease Monitoring: Blood Sugar ranges-140ish Polyuria/phagia/dipsia- nocturia      Optho- UTD Medications: Compliance- taking tradjenta Hypoglycemic symptoms- see below  Swimmy headed: The patient had an episode yesterday after working outside on his lawnmower where he felt swimmy headed.  Does not sound as though he was actually lightheaded.  He came inside and had several grapes and this improved within 10 to 15 minutes.  His wife checked his blood pressure and it was 137/69.  She also reports his sugar was 143.  This is never happened previously.  Atrial flutter: Patient remains on Eliquis.  No palpitations.  No bleeding issues.  Hypothyroidism: He is on Synthroid.  No skin changes.  He does report cold intolerance.  History of rotator cuff tear: Patient previously saw orthopedics for this.  He had physical therapy and had an injection.  He notes difficulty abducting his shoulder though otherwise seems to do well with this.  Social History   Tobacco Use  Smoking Status Former   Packs/day: 1.00   Years: 25.00   Pack years: 25.00   Types: Cigarettes  Smokeless Tobacco Never    Current Outpatient Medications on File Prior to Visit  Medication Sig Dispense Refill   albuterol (VENTOLIN HFA) 108 (90 Base) MCG/ACT inhaler Inhale 1-2 puffs into the lungs every 4 (four) hours as needed for wheezing or shortness of breath. 1 each 0   apixaban (ELIQUIS) 2.5 MG TABS tablet Take 1 tablet (2.5 mg total) by mouth 2 (two) times daily. 60 tablet 6   bisacodyl (DULCOLAX) 5 MG EC tablet Take by mouth.     Fluticasone-Umeclidin-Vilant (TRELEGY ELLIPTA IN) Inhale 1 puff into the lungs daily.     levothyroxine (SYNTHROID) 25 MCG tablet Take 1 tablet (25 mcg total) by mouth daily before breakfast. 90 tablet 0   nitroGLYCERIN (NITROSTAT) 0.4 MG SL tablet Place 1 tablet (0.4 mg  total) under the tongue every 5 (five) minutes as needed for chest pain. 25 tablet 3   TRADJENTA 5 MG TABS tablet Take 1 tablet (5 mg total) by mouth daily. 30 tablet 0   atorvastatin (LIPITOR) 40 MG tablet Take 1 tablet (40 mg total) by mouth daily. 90 tablet 3   No current facility-administered medications on file prior to visit.     ROS see history of present illness  Objective  Physical Exam Vitals:   04/27/21 1038  BP: 100/60  Pulse: 66  Temp: 97.9 F (36.6 C)  SpO2: 99%   Laying blood pressure 118/70 pulse 55 Sitting blood pressure 125/73 pulse 54 Standing blood pressure 125/76 pulse 70  BP Readings from Last 3 Encounters:  04/27/21 100/60  03/30/21 120/60  10/24/20 105/68   Wt Readings from Last 3 Encounters:  04/27/21 166 lb 3.2 oz (75.4 kg)  03/30/21 169 lb 4 oz (76.8 kg)  11/01/20 166 lb (75.3 kg)    Physical Exam Constitutional:      General: He is not in acute distress.    Appearance: He is not diaphoretic.  Cardiovascular:     Rate and Rhythm: Normal rate and regular rhythm.     Heart sounds: Normal heart sounds.  Pulmonary:     Effort: Pulmonary effort is normal.     Breath sounds: Normal breath sounds.  Musculoskeletal:     Right lower leg: No edema.     Left lower leg: No edema.  Skin:    General: Skin is warm and dry.  Neurological:     Mental Status: He is alert.     Assessment/Plan: Please see individual problem list.  Problem List Items Addressed This Visit     Hyperlipidemia    Check lipid panel.  Continue Lipitor 40 mg once daily.       Relevant Orders   Comp Met (CMET)   Lipid panel   Atrial flutter (HCC)    Sinus rhythm today.  He will remain on Eliquis 2.5 mg twice daily.       Relevant Orders   CBC   Diabetes ([place])    Seems to be adequately controlled for his age.  He will continue Tradjenta 5 mg once daily.  We will check an A1c.  The episode he had yesterday could have been related to a low blood glucose  particularly given improvement with eating grapes.  I encouraged him to stay adequately hydrated when working outside and eat adequate amounts of food.  He will let us know if this recurs.       Relevant Orders   Comp Met (CMET)   HgB A1c   CKD (chronic kidney disease), stage III [facility])    The patient follows with nephrology. Labs completed in the past couple of months.        Hypothyroidism    Continue Synthroid 25 mg once daily.  Check TSH.       Relevant Orders   TSH   Rotator cuff tear, right    Generally stable.  He will monitor and I advised that they could follow-up with orthopedics in the future if they needed.         Health Maintenance: I encouraged the patient to get the COVID booster. He has not had COVID.   Return in about 6 months (around 10/27/2021) for Diabetes, hypertension.  This visit occurred during the SARS-CoV-2 public health emergency.  Safety protocols were in place, including screening questions prior to the visit, additional usage of staff PPE, and extensive cleaning of exam room while observing appropriate contact time as indicated for disinfecting solutions.    [name], MD [place]

## 2021-04-27 NOTE — Assessment & Plan Note (Signed)
Sinus rhythm today.  He will remain on Eliquis 2.5 mg twice daily.

## 2021-04-27 NOTE — Patient Instructions (Signed)
Nice to see you. We will get lab work today. Please try to make sure you are adequately hydrated while working outside.  If you have recurrence of your swimmy headed episodes please let us know. Will get lab work today. Please get the COVID booster.

## 2021-04-27 NOTE — Assessment & Plan Note (Addendum)
Seems to be adequately controlled for his age.  He will continue Tradjenta 5 mg once daily.  We will check an A1c.  The episode he had yesterday could have been related to a low blood glucose particularly given improvement with eating grapes.  I encouraged him to stay adequately hydrated when working outside and eat adequate amounts of food.  He will let us know if this recurs.

## 2021-04-27 NOTE — Assessment & Plan Note (Signed)
The patient follows with nephrology. Labs completed in the past couple of months.

## 2021-05-01 ENCOUNTER — Other Ambulatory Visit: Payer: Self-pay | Admitting: Family Medicine

## 2021-05-01 NOTE — Progress Notes (Signed)
I called and spoke with the patient's wife and informed her of the patients lab results and she understood.  Sheera Illingworth,cma

## 2021-05-11 DIAGNOSIS — I252 Old myocardial infarction: Secondary | ICD-10-CM | POA: Diagnosis not present

## 2021-05-11 DIAGNOSIS — Z008 Encounter for other general examination: Secondary | ICD-10-CM | POA: Diagnosis not present

## 2021-05-11 DIAGNOSIS — E785 Hyperlipidemia, unspecified: Secondary | ICD-10-CM | POA: Diagnosis not present

## 2021-05-11 DIAGNOSIS — E119 Type 2 diabetes mellitus without complications: Secondary | ICD-10-CM | POA: Diagnosis not present

## 2021-05-11 DIAGNOSIS — R030 Elevated blood-pressure reading, without diagnosis of hypertension: Secondary | ICD-10-CM | POA: Diagnosis not present

## 2021-05-11 DIAGNOSIS — J439 Emphysema, unspecified: Secondary | ICD-10-CM | POA: Diagnosis not present

## 2021-05-11 DIAGNOSIS — G3184 Mild cognitive impairment, so stated: Secondary | ICD-10-CM | POA: Diagnosis not present

## 2021-05-11 DIAGNOSIS — E039 Hypothyroidism, unspecified: Secondary | ICD-10-CM | POA: Diagnosis not present

## 2021-05-11 DIAGNOSIS — Z7951 Long term (current) use of inhaled steroids: Secondary | ICD-10-CM | POA: Diagnosis not present

## 2021-05-11 DIAGNOSIS — I25119 Atherosclerotic heart disease of native coronary artery with unspecified angina pectoris: Secondary | ICD-10-CM | POA: Diagnosis not present

## 2021-05-11 DIAGNOSIS — Z7901 Long term (current) use of anticoagulants: Secondary | ICD-10-CM | POA: Diagnosis not present

## 2021-05-28 ENCOUNTER — Other Ambulatory Visit: Payer: Self-pay | Admitting: Family Medicine

## 2021-06-26 ENCOUNTER — Other Ambulatory Visit: Payer: Self-pay | Admitting: Family Medicine

## 2021-07-18 ENCOUNTER — Ambulatory Visit: Payer: Self-pay

## 2021-07-26 ENCOUNTER — Other Ambulatory Visit: Payer: Self-pay | Admitting: Internal Medicine

## 2021-07-26 ENCOUNTER — Other Ambulatory Visit: Payer: Self-pay | Admitting: Family Medicine

## 2021-08-23 ENCOUNTER — Other Ambulatory Visit: Payer: Self-pay | Admitting: Family Medicine

## 2021-08-24 ENCOUNTER — Other Ambulatory Visit: Payer: Self-pay | Admitting: Family Medicine

## 2021-09-13 DIAGNOSIS — N1832 Chronic kidney disease, stage 3b: Secondary | ICD-10-CM | POA: Diagnosis not present

## 2021-09-13 DIAGNOSIS — E1122 Type 2 diabetes mellitus with diabetic chronic kidney disease: Secondary | ICD-10-CM | POA: Diagnosis not present

## 2021-09-13 DIAGNOSIS — I10 Essential (primary) hypertension: Secondary | ICD-10-CM | POA: Diagnosis not present

## 2021-09-13 DIAGNOSIS — R600 Localized edema: Secondary | ICD-10-CM | POA: Diagnosis not present

## 2021-09-20 ENCOUNTER — Other Ambulatory Visit: Payer: Self-pay

## 2021-09-20 ENCOUNTER — Ambulatory Visit: Payer: Self-pay

## 2021-09-20 ENCOUNTER — Ambulatory Visit (INDEPENDENT_AMBULATORY_CARE_PROVIDER_SITE_OTHER): Payer: Medicare HMO

## 2021-09-20 DIAGNOSIS — Z23 Encounter for immunization: Secondary | ICD-10-CM

## 2021-09-25 ENCOUNTER — Other Ambulatory Visit: Payer: Self-pay | Admitting: Family Medicine

## 2021-09-28 ENCOUNTER — Other Ambulatory Visit: Payer: Self-pay

## 2021-09-28 ENCOUNTER — Encounter: Payer: Self-pay | Admitting: Cardiovascular Disease

## 2021-09-28 ENCOUNTER — Ambulatory Visit: Payer: Medicare HMO | Admitting: Cardiovascular Disease

## 2021-09-28 VITALS — BP 120/64 | HR 62 | Ht 70.0 in | Wt 168.2 lb

## 2021-09-28 DIAGNOSIS — I4892 Unspecified atrial flutter: Secondary | ICD-10-CM | POA: Diagnosis not present

## 2021-09-28 DIAGNOSIS — I10 Essential (primary) hypertension: Secondary | ICD-10-CM | POA: Diagnosis not present

## 2021-09-28 DIAGNOSIS — E785 Hyperlipidemia, unspecified: Secondary | ICD-10-CM | POA: Diagnosis not present

## 2021-09-28 DIAGNOSIS — I25118 Atherosclerotic heart disease of native coronary artery with other forms of angina pectoris: Secondary | ICD-10-CM

## 2021-09-28 NOTE — Progress Notes (Signed)
Cardiology Office Note   Date:  09/28/2021   ID:  Devin Reese, DOB March 11, 1927, MRN [id number]  PCP:  Leone Haven, MD  Cardiologist:   Kathlyn Sacramento, MD   Chief Complaint  Patient presents with   Other    6 Month f/u pt c/o of giving out pt recently had two falls due to tripping upon his feet. Meds reviewed verbally with pt.      History of Present Illness: Devin Reese is a 87 y.o. male who presents for a followup visit  He has known history of coronary artery disease status post CABG in 1991 at [facility]. He was diagnosed with atrial flutter in 2014 with subsequent successful cardioversion. No recurrent arrhythmia since then.  Most recent echocardiogram in 2018 showed mildly reduced LV systolic function with an EF of 40 to 45% with inferior wall hypokinesis, mild mitral and aortic regurgitation. Carvedilol was discontinued recently due to bradycardia.  Lexiscan Myoview in August 2018 showed prior inferior scar with no significant ischemia.  He has chronic kidney disease followed by Dr. Candiss Norse.  He fell recently while working in the yard.  He did not have syncope.  He had some left knee discomfort but no significant swelling.  No chest pain or shortness of breath.   Past Medical History:  Diagnosis Date   Atrial flutter ([place])    CAD (coronary artery disease)    CKD (chronic kidney disease), stage II    COPD (chronic obstructive pulmonary disease) ([place])    Coronary artery disease    CABG in 1991 at [facility]. Most recent cardiac catheterization in 1017 was complicated by stroke.   Diabetes mellitus without complication ([place])    Hyperlipidemia    Hypertension    Prostate cancer ([place])    Stroke [facility])     Past Surgical History:  Procedure Laterality Date   CARDIAC CATHETERIZATION     CARDIAC SURGERY     CARDIOVERSION  03/29/13   CORONARY ARTERY BYPASS GRAFT       Current Outpatient Medications  Medication Sig Dispense Refill   albuterol (VENTOLIN  HFA) 108 (90 Base) MCG/ACT inhaler Inhale 1-2 puffs into the lungs every 4 (four) hours as needed for wheezing or shortness of breath. 1 each 0   apixaban (ELIQUIS) 2.5 MG TABS tablet Take 1 tablet (2.5 mg total) by mouth 2 (two) times daily. 60 tablet 6   atorvastatin (LIPITOR) 40 MG tablet Take 1 tablet (40 mg total) by mouth daily. 90 tablet 3   bisacodyl (DULCOLAX) 5 MG EC tablet Take by mouth.     Fluticasone-Umeclidin-Vilant (TRELEGY ELLIPTA IN) Inhale 1 puff into the lungs daily.     levothyroxine (SYNTHROID) 25 MCG tablet Take 1 tablet (25 mcg total) by mouth daily before breakfast. 90 tablet 0   nitroGLYCERIN (NITROSTAT) 0.4 MG SL tablet Place 1 tablet (0.4 mg total) under the tongue every 5 (five) minutes as needed for chest pain. 25 tablet 3   TRADJENTA 5 MG TABS tablet Take 1 tablet (5 mg total) by mouth daily. 30 tablet 0   No current facility-administered medications for this visit.    Allergies:   Patient has no known allergies.    Social History:  The patient  reports that he has quit smoking. His smoking use included cigarettes. He has a 25.00 pack-year smoking history. He has never used smokeless tobacco. He reports that he does not drink alcohol and does not use drugs.   Family History:  The patient's family history includes Cancer in an other family member; Coronary artery disease in his father and another family member; Diabetes in his brother; Heart attack in his brother; Heart disease in his father.    ROS:  Please see the history of present illness.   Otherwise, review of systems are positive for none.   All other systems are reviewed and negative.    PHYSICAL EXAM: VS:  BP 120/64 (BP Location: Left Arm, Patient Position: Sitting, Cuff Size: Normal)   Pulse 62   Ht 5\' 10"  (1.778 m)   Wt 168 lb 4 oz (76.3 kg)   SpO2 98%   BMI 24.14 kg/m  , BMI Body mass index is 24.14 kg/m. GEN: Well nourished, well developed, in no acute distress  HEENT: normal  Neck: no JVD,  carotid bruits, or masses Cardiac: RRR with premature beats; no rubs, or gallops,no edema .  1 /6 systolic murmur in the aortic area. Respiratory:  clear to auscultation bilaterally, normal work of breathing GI: soft, nontender, nondistended, + BS MS: no deformity or atrophy  Skin: warm and dry, no rash Neuro:  Strength and sensation are intact Psych: euthymic mood, full affect   EKG:  EKG is ordered today. The ekg ordered today demonstrates sinus rhythm with a PVC and nonspecific ST changes.  Recent Labs: 04/27/2021: ALT 11; BUN 30; Creatinine, Ser 1.79; Hemoglobin 12.1; Platelets 184.0; Potassium 4.4; Sodium 140; TSH 1.49    Lipid Panel    Component Value Date/Time   CHOL 133 04/27/2021 1103   CHOL 143 03/20/2017 0826   TRIG 101.0 04/27/2021 1103   HDL 37.50 (L) 04/27/2021 1103   HDL 40 03/20/2017 0826   CHOLHDL 4 04/27/2021 1103   VLDL 20.2 04/27/2021 1103   LDLCALC 75 04/27/2021 1103   LDLCALC 84 03/20/2017 0826      Wt Readings from Last 3 Encounters:  09/28/21 168 lb 4 oz (76.3 kg)  04/27/21 166 lb 3.2 oz (75.4 kg)  03/30/21 169 lb 4 oz (76.8 kg)         ASSESSMENT AND PLAN:  1.  Paroxysmal atrial flutter: No evidence of recurrent arrhythmia and heart rate seems to be controlled.   Continue long-term anticoagulation with low-dose Eliquis given age and chronic kidney disease.  I reviewed most recent labs done in June which showed stable hemoglobin at 12.1.  Creatinine was stable at 1.79. I discussed with him the importance of trying to avoid recent falls.   2. Coronary artery disease involving native coronary arteries with stable angina:  He reports no anginal symptoms at the present time.   He is not on a beta-blocker due to chronic bradycardia.   3. Essential hypertension: Blood pressure is controlled without medications.   4. Hyperlipidemia: I reviewed most recent lipid profile done in June which showed an LDL of 75.  Continue atorvastatin 40 mg  daily.     Disposition:   FU with me in 6 months  Signed,  Kathlyn Sacramento, MD  09/28/2021 3:16 PM    [employer]

## 2021-09-28 NOTE — Patient Instructions (Signed)

## 2021-10-31 ENCOUNTER — Ambulatory Visit (INDEPENDENT_AMBULATORY_CARE_PROVIDER_SITE_OTHER): Payer: Medicare HMO

## 2021-10-31 ENCOUNTER — Other Ambulatory Visit: Payer: Self-pay

## 2021-10-31 ENCOUNTER — Encounter: Payer: Self-pay | Admitting: Family Medicine

## 2021-10-31 ENCOUNTER — Ambulatory Visit (INDEPENDENT_AMBULATORY_CARE_PROVIDER_SITE_OTHER): Payer: Medicare HMO | Admitting: Family Medicine

## 2021-10-31 DIAGNOSIS — W19XXXA Unspecified fall, initial encounter: Secondary | ICD-10-CM | POA: Diagnosis not present

## 2021-10-31 DIAGNOSIS — M25562 Pain in left knee: Secondary | ICD-10-CM

## 2021-10-31 DIAGNOSIS — E119 Type 2 diabetes mellitus without complications: Secondary | ICD-10-CM

## 2021-10-31 DIAGNOSIS — E039 Hypothyroidism, unspecified: Secondary | ICD-10-CM

## 2021-10-31 DIAGNOSIS — I4892 Unspecified atrial flutter: Secondary | ICD-10-CM

## 2021-10-31 LAB — BASIC METABOLIC PANEL
BUN: 28 mg/dL — ABNORMAL HIGH (ref 6–23)
CO2: 29 mEq/L (ref 19–32)
Calcium: 9.3 mg/dL (ref 8.4–10.5)
Chloride: 103 mEq/L (ref 96–112)
Creatinine, Ser: 1.7 mg/dL — ABNORMAL HIGH (ref 0.40–1.50)
GFR: 34.46 mL/min — ABNORMAL LOW (ref >60.00)
Glucose, Bld: 138 mg/dL — ABNORMAL HIGH (ref 70–99)
Potassium: 4.4 mEq/L (ref 3.5–5.1)
Sodium: 138 mEq/L (ref 135–145)

## 2021-10-31 LAB — HEMOGLOBIN A1C: Hgb A1c MFr Bld: 7 % — ABNORMAL HIGH (ref 4.6–6.5)

## 2021-10-31 LAB — TSH: TSH: 2.1 u[IU]/mL (ref 0.35–5.50)

## 2021-10-31 IMAGING — DX DG KNEE COMPLETE 4+V*L*
5 series · 5 of 5 positions shown · non-contrast
Comparison: None.

CLINICAL DATA: Left knee pain after fall 1 month ago.

EXAM:
LEFT KNEE - COMPLETE 4+ VIEW

[knee standing ap]
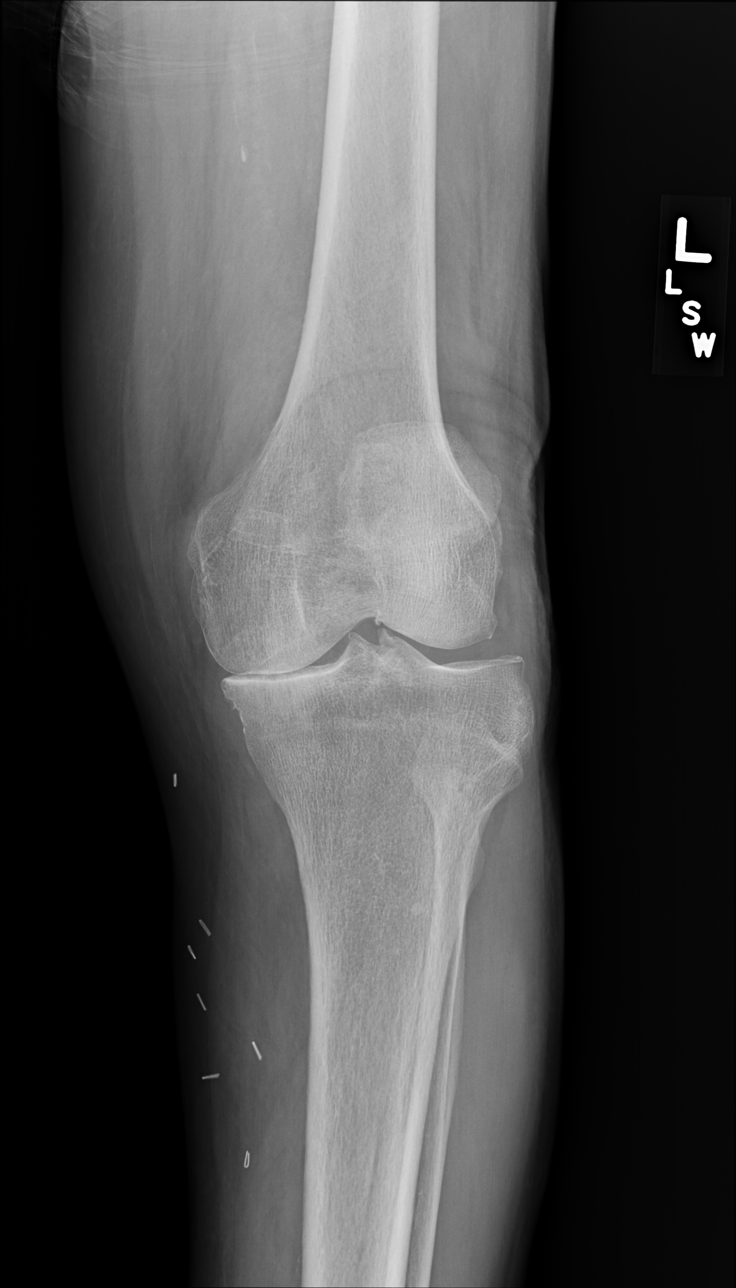

[knee standing external ap]
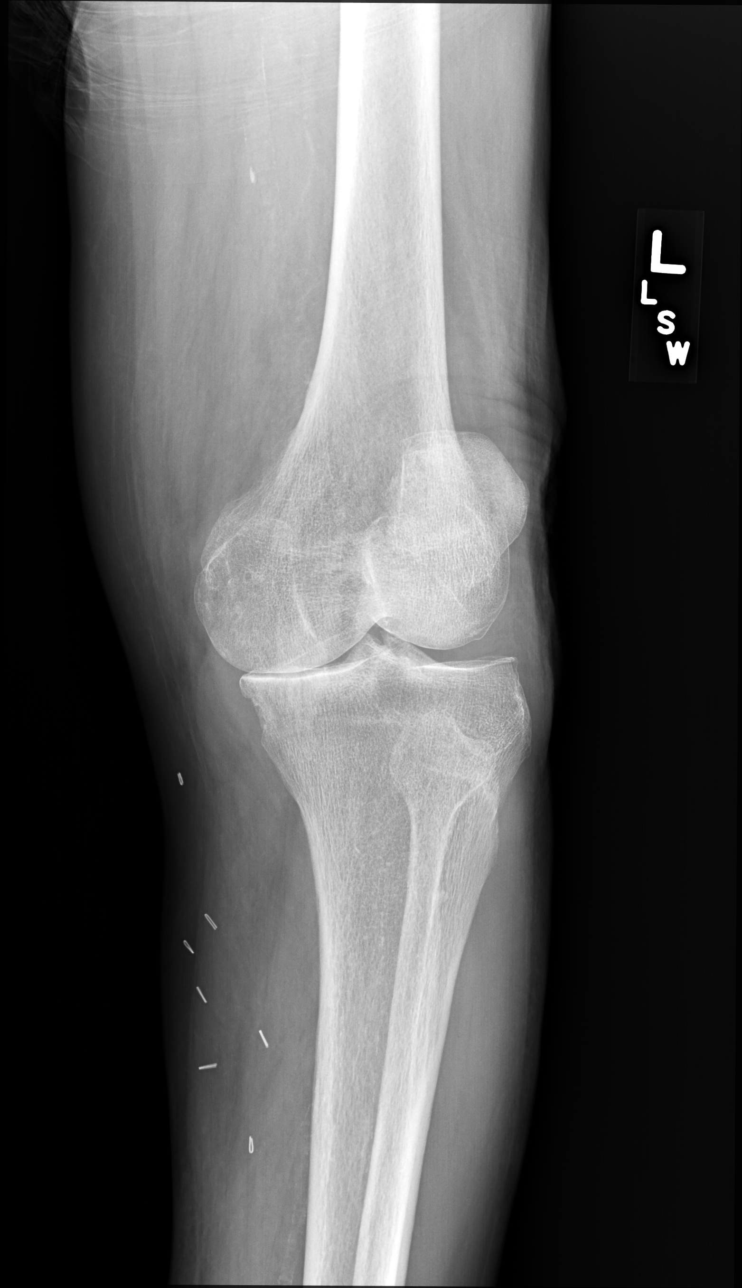

[knee standing internal ap]
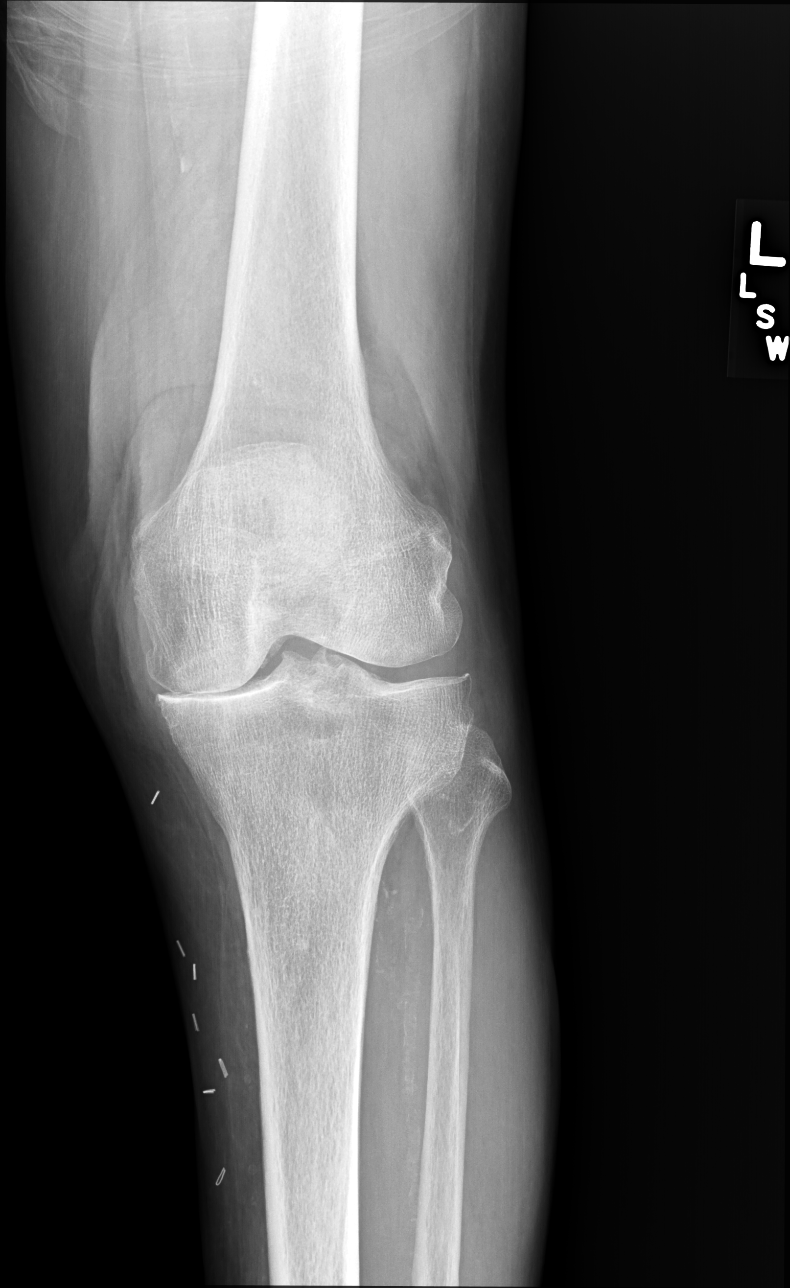

[knee standing lat]
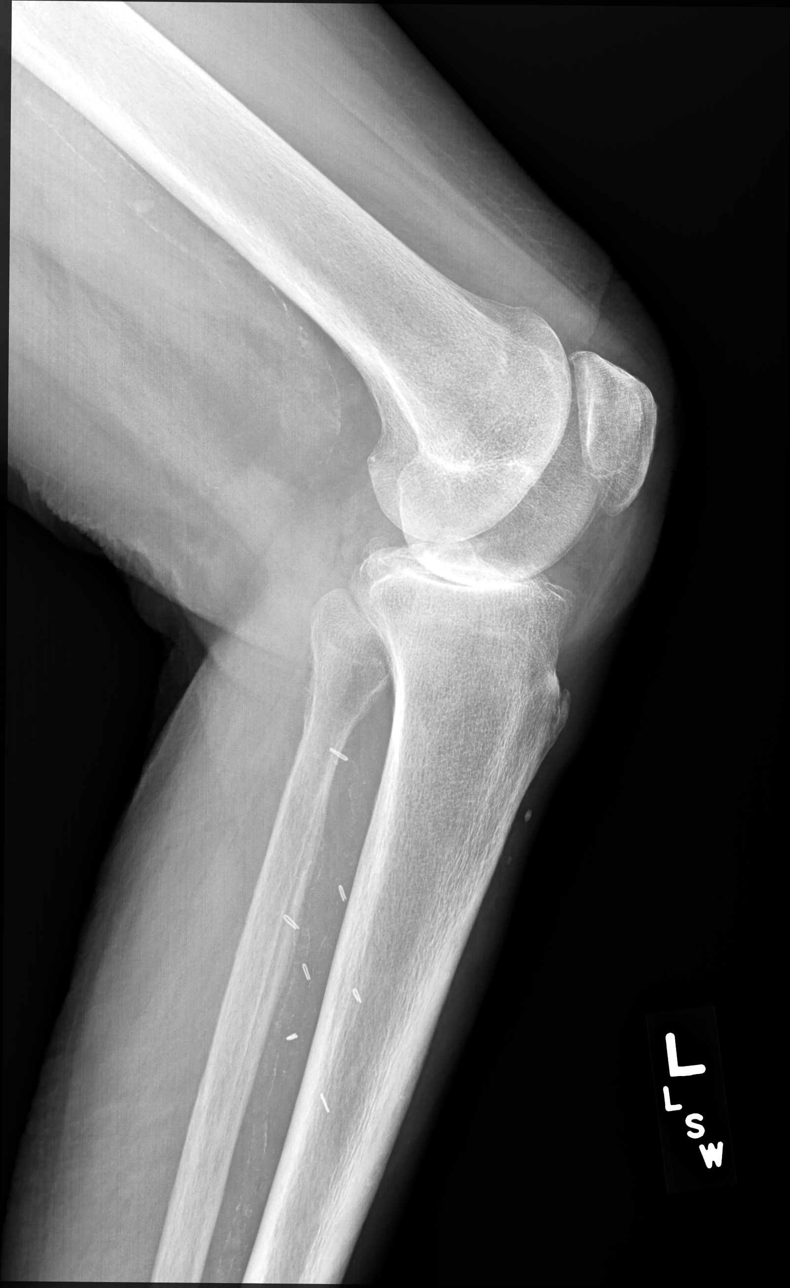

[knee [person_name] view pa]
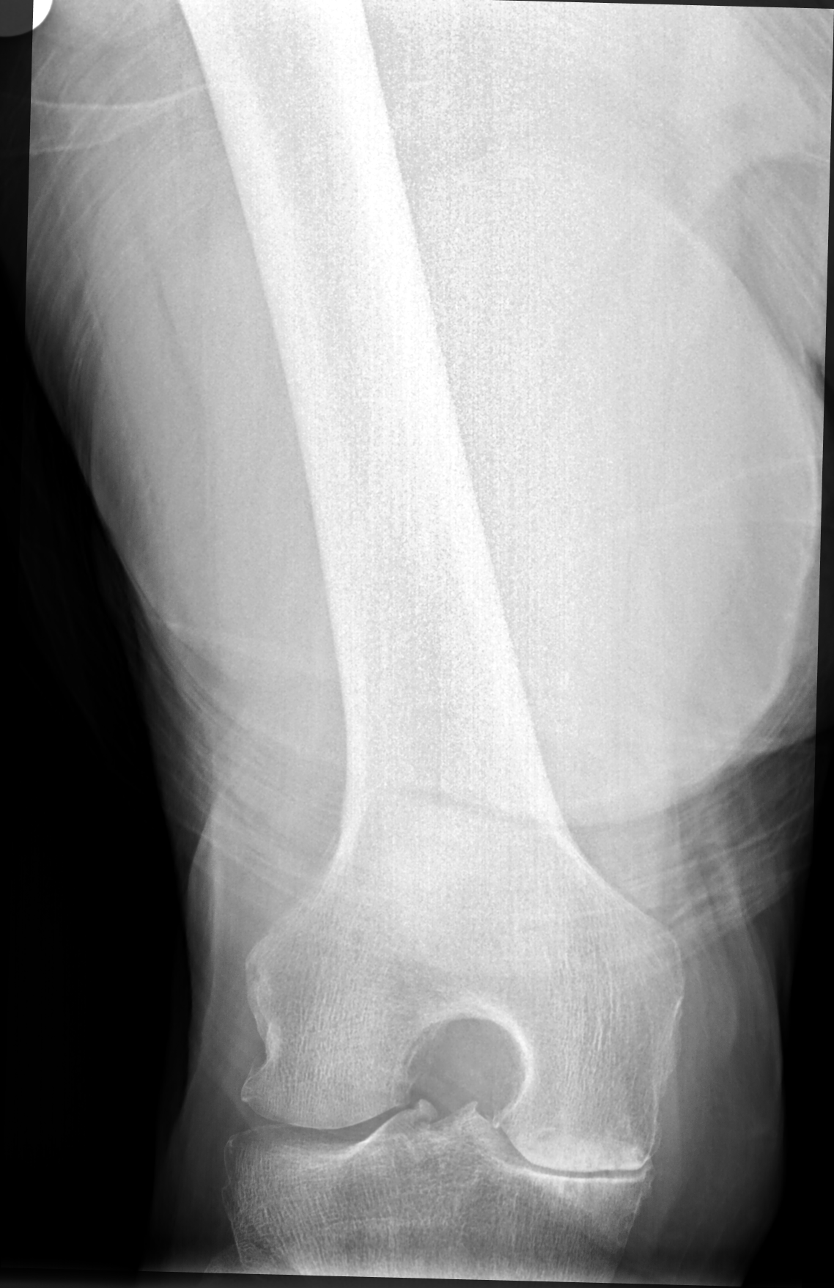

[5 of 5 positions shown; findings below may reference images not displayed]

FINDINGS: No evidence of fracture, dislocation, or joint effusion. Severe
narrowing of medial joint space is noted. Soft tissues are
unremarkable.
IMPRESSION: Severe degenerative joint disease is noted medially. No acute
abnormality seen.

## 2021-10-31 NOTE — Assessment & Plan Note (Addendum)
The patient had 2 falls recently.  The first 1 seemed to be related to him not paying attention to what he was doing while walking and stepping into a hole.  He does have some left knee pain and we will evaluate that as outlined.  Discussed wearing well fitting shoes that are tied.  Discussed watching where he is going.  If he has further falls I did discuss that we would have to consider stopping the Eliquis.  We also discussed having him use a cane to help with his balance.

## 2021-10-31 NOTE — Progress Notes (Signed)
Devin Rumps, MD Phone: [phone]  Devin Reese is a 87 y.o. male who presents today for f/u.  DIABETES Disease Monitoring: Blood Sugar ranges-140s Polyuria/phagia/dipsia- no      Optho- UTD Medications: Compliance- taking tradjenta Hypoglycemic symptoms- no  HYPOTHYROIDISM Disease Monitoring Weight changes: no  Skin Changes: no Heat/Cold intolerance: no  Medication Monitoring Compliance:  taking synthroid   Last TSH:   Lab Results  Component Value Date   TSH 1.49 04/27/2021   Atrial flutter: Patient is on Eliquis.  He notes no palpitations, chest pain, or shortness of breath.  No bleeding issues.  Falls: Patient and his wife report he has fallen twice in the last several months.  He reports the first time he was walking down the driveway while reading the paper and stepped in a hole.  He noted injury to his left knee.  No head injury.  The second time he fell he reports he was off balance.  They do report occasional balance difficulty.  He notes no numbness or weakness.  No dizziness.  No lightheadedness.  He occasionally wear shoes that he does not tie.   Social History   Tobacco Use  Smoking Status Former   Packs/day: 1.00   Years: 25.00   Pack years: 25.00   Types: Cigarettes  Smokeless Tobacco Never    Current Outpatient Medications on File Prior to Visit  Medication Sig Dispense Refill   albuterol (VENTOLIN HFA) 108 (90 Base) MCG/ACT inhaler Inhale 1-2 puffs into the lungs every 4 (four) hours as needed for wheezing or shortness of breath. 1 each 0   apixaban (ELIQUIS) 2.5 MG TABS tablet Take 1 tablet (2.5 mg total) by mouth 2 (two) times daily. 60 tablet 6   bisacodyl (DULCOLAX) 5 MG EC tablet Take by mouth.     Fluticasone-Umeclidin-Vilant (TRELEGY ELLIPTA IN) Inhale 1 puff into the lungs daily.     levothyroxine (SYNTHROID) 25 MCG tablet Take 1 tablet (25 mcg total) by mouth daily before breakfast. 90 tablet 0   nitroGLYCERIN (NITROSTAT) 0.4 MG SL  tablet Place 1 tablet (0.4 mg total) under the tongue every 5 (five) minutes as needed for chest pain. 25 tablet 3   TRADJENTA 5 MG TABS tablet Take 1 tablet (5 mg total) by mouth daily. 30 tablet 0   atorvastatin (LIPITOR) 40 MG tablet Take 1 tablet (40 mg total) by mouth daily. 90 tablet 3   No current facility-administered medications on file prior to visit.     ROS see history of present illness  Objective  Physical Exam Vitals:   10/31/21 1109  BP: 105/70  Pulse: 68  Temp: 97.6 F (36.4 C)  SpO2: 97%    BP Readings from Last 3 Encounters:  10/31/21 105/70  09/28/21 120/64  04/27/21 100/60   Wt Readings from Last 3 Encounters:  10/31/21 170 lb (77.1 kg)  09/28/21 168 lb 4 oz (76.3 kg)  04/27/21 166 lb 3.2 oz (75.4 kg)    Physical Exam Constitutional:      General: He is not in acute distress.    Appearance: He is not diaphoretic.  Cardiovascular:     Rate and Rhythm: Normal rate. Rhythm irregularly irregular.     Heart sounds: Normal heart sounds.  Pulmonary:     Effort: Pulmonary effort is normal.     Breath sounds: Normal breath sounds.  Musculoskeletal:     Comments: Left knee with medial joint line tenderness, there is discomfort on McMurray's testing of the left  knee, no other tenderness, no swelling, no soft tissue changes, no discomfort on range of motion of his left hip internally or externally, no palpable tenderness of his left hip  Skin:    General: Skin is warm and dry.  Neurological:     Mental Status: He is alert.     Comments: 5/5 strength in bilateral biceps, triceps, grip, quads, hamstrings, plantar and dorsiflexion, sensation to light touch intact in bilateral UE and LE     Assessment/Plan: Please see individual problem list.  Problem List Items Addressed This Visit     Atrial flutter ([place])    Rate controlled.  He will continue Eliquis 2.5 mg twice daily.      Diabetes (HCC)    Check A1c.  Continue Tradjenta 5 mg once daily.       Relevant Orders   Basic Metabolic Panel (BMET)   HgB A1c   Fall    The patient had 2 falls recently.  The first 1 seemed to be related to him not paying attention to what he was doing while walking and stepping into a hole.  He does have some left knee pain and we will evaluate that as outlined.  Discussed wearing well fitting shoes that are tied.  Discussed watching where he is going.  If he has further falls I did discuss that we would have to consider stopping the Eliquis.  We also discussed having him use a cane to help with his balance.      Hypothyroidism    Check TSH.  Continue Synthroid 25 mcg once daily.      Relevant Orders   TSH   Left knee pain    This started after an injury to his left knee.  Concern would be for fracture versus a meniscal injury.  We will get an x-ray to start with.      Relevant Orders   DG Knee Complete 4 Views Left     Return in about 6 months (around 05/01/2022).  This visit occurred during the SARS-CoV-2 public health emergency.  Safety protocols were in place, including screening questions prior to the visit, additional usage of staff PPE, and extensive cleaning of exam room while observing appropriate contact time as indicated for disinfecting solutions.    Devin Rumps, MD [place]

## 2021-10-31 NOTE — Assessment & Plan Note (Signed)
Check A1c.  Continue Tradjenta 5 mg once daily.

## 2021-10-31 NOTE — Assessment & Plan Note (Signed)
Rate controlled.  He will continue Eliquis 2.5 mg twice daily.

## 2021-10-31 NOTE — Assessment & Plan Note (Signed)
Check TSH.  Continue Synthroid 25 mcg once daily.

## 2021-10-31 NOTE — Assessment & Plan Note (Signed)
This started after an injury to his left knee.  Concern would be for fracture versus a meniscal injury.  We will get an x-ray to start with.

## 2021-10-31 NOTE — Patient Instructions (Signed)
Nice to see you. You need to wear well fitting shoes that are tied when you are up walking around.  Please try to use your cane as well particularly when you are on uneven ground.   We will get an x-ray today and lab work.

## 2021-11-03 ENCOUNTER — Other Ambulatory Visit: Payer: Self-pay | Admitting: Cardiovascular Disease

## 2021-11-03 ENCOUNTER — Other Ambulatory Visit: Payer: Self-pay | Admitting: Family Medicine

## 2021-11-05 NOTE — Telephone Encounter (Signed)
Eliquis 2.5 mg refill request received. Patient is 87 years old, weight-77.1 kg, Crea- 1.7 on 10/31/21 , Diagnosis- atrial flutter, and last seen by Dr. Fletcher Anon on 09/28/21. Dose is appropriate based on dosing criteria. Will send in refill to requested pharmacy.

## 2021-11-07 ENCOUNTER — Other Ambulatory Visit: Payer: Self-pay | Admitting: Family Medicine

## 2021-11-07 DIAGNOSIS — M25562 Pain in left knee: Secondary | ICD-10-CM

## 2021-11-23 ENCOUNTER — Other Ambulatory Visit: Payer: Self-pay

## 2021-11-23 ENCOUNTER — Encounter: Payer: Self-pay | Admitting: Emergency Medicine

## 2021-11-23 ENCOUNTER — Telehealth: Payer: Self-pay | Admitting: Family Medicine

## 2021-11-23 ENCOUNTER — Emergency Department: Payer: Medicare HMO

## 2021-11-23 DIAGNOSIS — Z951 Presence of aortocoronary bypass graft: Secondary | ICD-10-CM | POA: Diagnosis not present

## 2021-11-23 DIAGNOSIS — Z20822 Contact with and (suspected) exposure to covid-19: Secondary | ICD-10-CM | POA: Diagnosis not present

## 2021-11-23 DIAGNOSIS — Z79899 Other long term (current) drug therapy: Secondary | ICD-10-CM | POA: Diagnosis not present

## 2021-11-23 DIAGNOSIS — J8410 Pulmonary fibrosis, unspecified: Secondary | ICD-10-CM | POA: Diagnosis not present

## 2021-11-23 DIAGNOSIS — I129 Hypertensive chronic kidney disease with stage 1 through stage 4 chronic kidney disease, or unspecified chronic kidney disease: Secondary | ICD-10-CM | POA: Insufficient documentation

## 2021-11-23 DIAGNOSIS — I2510 Atherosclerotic heart disease of native coronary artery without angina pectoris: Secondary | ICD-10-CM | POA: Diagnosis not present

## 2021-11-23 DIAGNOSIS — N182 Chronic kidney disease, stage 2 (mild): Secondary | ICD-10-CM | POA: Diagnosis not present

## 2021-11-23 DIAGNOSIS — Z8546 Personal history of malignant neoplasm of prostate: Secondary | ICD-10-CM | POA: Insufficient documentation

## 2021-11-23 DIAGNOSIS — K449 Diaphragmatic hernia without obstruction or gangrene: Secondary | ICD-10-CM | POA: Diagnosis not present

## 2021-11-23 DIAGNOSIS — R911 Solitary pulmonary nodule: Secondary | ICD-10-CM | POA: Diagnosis not present

## 2021-11-23 DIAGNOSIS — Z7901 Long term (current) use of anticoagulants: Secondary | ICD-10-CM | POA: Insufficient documentation

## 2021-11-23 DIAGNOSIS — Z7951 Long term (current) use of inhaled steroids: Secondary | ICD-10-CM | POA: Insufficient documentation

## 2021-11-23 DIAGNOSIS — R0789 Other chest pain: Secondary | ICD-10-CM | POA: Insufficient documentation

## 2021-11-23 DIAGNOSIS — Z7984 Long term (current) use of oral hypoglycemic drugs: Secondary | ICD-10-CM | POA: Insufficient documentation

## 2021-11-23 DIAGNOSIS — E1122 Type 2 diabetes mellitus with diabetic chronic kidney disease: Secondary | ICD-10-CM | POA: Insufficient documentation

## 2021-11-23 DIAGNOSIS — Z8673 Personal history of transient ischemic attack (TIA), and cerebral infarction without residual deficits: Secondary | ICD-10-CM | POA: Diagnosis not present

## 2021-11-23 DIAGNOSIS — I4892 Unspecified atrial flutter: Secondary | ICD-10-CM | POA: Diagnosis not present

## 2021-11-23 DIAGNOSIS — R079 Chest pain, unspecified: Secondary | ICD-10-CM | POA: Diagnosis not present

## 2021-11-23 DIAGNOSIS — J449 Chronic obstructive pulmonary disease, unspecified: Secondary | ICD-10-CM | POA: Diagnosis not present

## 2021-11-23 LAB — BASIC METABOLIC PANEL
Anion gap: 7 (ref 5–15)
BUN: 33 mg/dL — ABNORMAL HIGH (ref 8–23)
CO2: 23 mmol/L (ref 22–32)
Calcium: 8.7 mg/dL — ABNORMAL LOW (ref 8.9–10.3)
Chloride: 105 mmol/L (ref 98–111)
Creatinine, Ser: 1.56 mg/dL — ABNORMAL HIGH (ref 0.61–1.24)
GFR, Estimated: 41 mL/min — ABNORMAL LOW (ref >60)
Glucose, Bld: 118 mg/dL — ABNORMAL HIGH (ref 70–99)
Potassium: 4.1 mmol/L (ref 3.5–5.1)
Sodium: 135 mmol/L (ref 135–145)

## 2021-11-23 LAB — CBC
HCT: 38.2 % — ABNORMAL LOW (ref 39.0–52.0)
Hemoglobin: 12.4 g/dL — ABNORMAL LOW (ref 13.0–17.0)
MCH: 31.6 pg (ref 26.0–34.0)
MCHC: 32.5 g/dL (ref 30.0–36.0)
MCV: 97.2 fL (ref 80.0–100.0)
Platelets: 157 10*3/uL (ref 150–400)
RBC: 3.93 MIL/uL — ABNORMAL LOW (ref 4.22–5.81)
RDW: 13 % (ref 11.5–15.5)
WBC: 4.8 10*3/uL (ref 4.0–10.5)
nRBC: 0 % (ref 0.0–0.2)

## 2021-11-23 LAB — TROPONIN I (HIGH SENSITIVITY)
Troponin I (High Sensitivity): 18 ng/L — ABNORMAL HIGH (ref <18)
Troponin I (High Sensitivity): 18 ng/L — ABNORMAL HIGH (ref <18)

## 2021-11-23 LAB — RESP PANEL BY RT-PCR (FLU A&B, COVID) ARPGX2
Influenza A by PCR: NEGATIVE
Influenza B by PCR: NEGATIVE
SARS Coronavirus 2 by RT PCR: NEGATIVE

## 2021-11-23 NOTE — ED Triage Notes (Signed)
C/O chest tightness earlier this morning.  Arrives to ED for evaluation.  Patient denies current complaint.  Declined offer of wheelchair.

## 2021-11-23 NOTE — ED Provider Triage Note (Signed)
°  Emergency Medicine Provider Triage Evaluation Note  Devin Reese , a 88 y.o.male,  was evaluated in triage.  Pt complains of chest pain. Patient is joined by his family who states that he has been quite active for the past month or so.  Endorses chest tightness that started today.   Review of Systems  Positive: Chest pain, weakness Negative: Denies fever, chest pain, vomiting  Physical Exam  There were no vitals filed for this visit. Gen:   Awake, no distress   Resp:  Normal effort  MSK:   Moves extremities without difficulty  Other:    Medical Decision Making  Given the patient's initial medical screening exam, the following diagnostic evaluation has been ordered. The patient will be placed in the appropriate treatment space, once one is available, to complete the evaluation and treatment. I have discussed the plan of care with the patient and I have advised the patient that an ED physician or mid-level practitioner will reevaluate their condition after the test results have been received, as the results may give them additional insight into the type of treatment they may need.    Diagnostics: Labs, CXR, EKG  Treatments: none immediately   Teodoro Spray, [place] 11/23/21 1658

## 2021-11-23 NOTE — Telephone Encounter (Signed)
Pt daughter is calling in stating pt felt bad today. Pt daughter thinks he has congestion but no fever. Pt has not taken a covid test. Pt daughter want to make sure that he does not have pneumonia. Sent to access nurse

## 2021-11-24 ENCOUNTER — Emergency Department
Admission: EM | Admit: 2021-11-24 | Discharge: 2021-11-24 | Disposition: A | Discharge status: Home / Self Care | Payer: Medicare HMO | Attending: Emergency Medicine | Admitting: Emergency Medicine

## 2021-11-24 ENCOUNTER — Emergency Department: Payer: Medicare HMO

## 2021-11-24 DIAGNOSIS — J8410 Pulmonary fibrosis, unspecified: Secondary | ICD-10-CM | POA: Diagnosis not present

## 2021-11-24 DIAGNOSIS — K449 Diaphragmatic hernia without obstruction or gangrene: Secondary | ICD-10-CM | POA: Diagnosis not present

## 2021-11-24 DIAGNOSIS — R911 Solitary pulmonary nodule: Secondary | ICD-10-CM | POA: Diagnosis not present

## 2021-11-24 DIAGNOSIS — R0789 Other chest pain: Secondary | ICD-10-CM

## 2021-11-24 LAB — URINALYSIS, ROUTINE W REFLEX MICROSCOPIC
Bilirubin Urine: NEGATIVE
Glucose, UA: NEGATIVE mg/dL
Ketones, ur: NEGATIVE mg/dL
Leukocytes,Ua: NEGATIVE
Nitrite: NEGATIVE
Protein, ur: NEGATIVE mg/dL
Specific Gravity, Urine: >1.046 — ABNORMAL HIGH (ref 1.005–1.030)
pH: 5 (ref 5.0–8.0)

## 2021-11-24 IMAGING — CT CT ANGIO CHEST
2 of 7 series · 18 of 46 positions shown · IV contrast (APPLIED)
Comparison: Chest x-ray today

CLINICAL DATA: Pulmonary embolism (PE) suspected, unknown D-dimer
mass on CXR

EXAM:
CT ANGIOGRAPHY CHEST WITH CONTRAST
TECHNIQUE: Multidetector CT imaging of the chest was performed using the
standard protocol during bolus administration of intravenous
contrast. Multiplanar CT image reconstructions and MIPs were
obtained to evaluate the vascular anatomy.
CONTRAST:  75mL OMNIPAQUE IOHEXOL 350 MG/ML SOLN

[Series 5: thins · axial · 0.76mm/px · z∈[-697,-412]mm · 15 of 396 slices shown]
[im 20/396  lung]
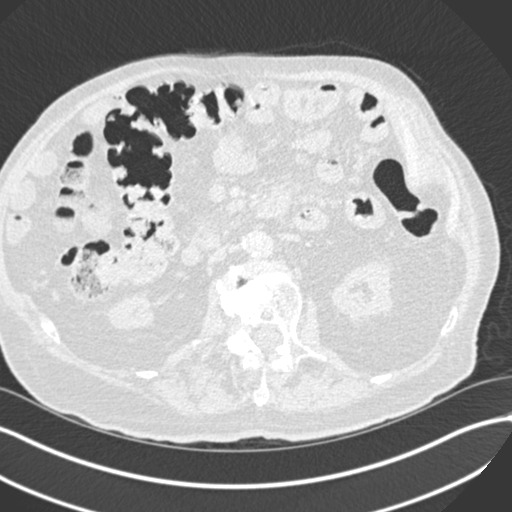
[im 40/396  soft-tissue]
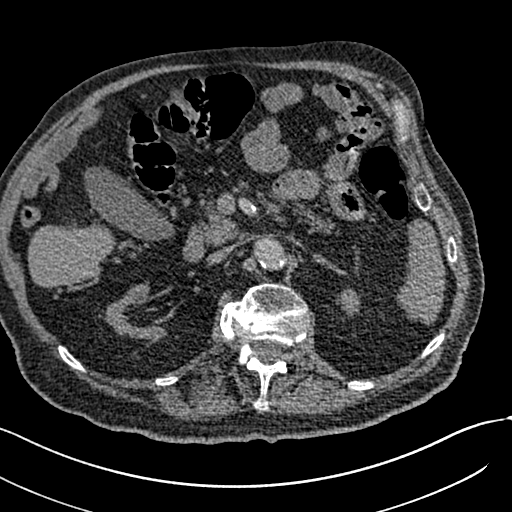
[im 80/396  lung]
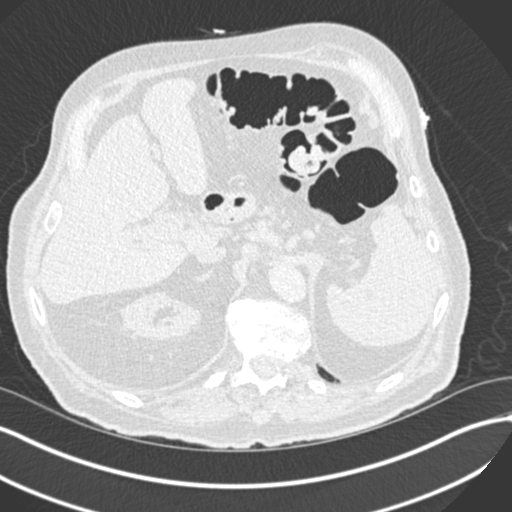
[im 99/396  soft-tissue]
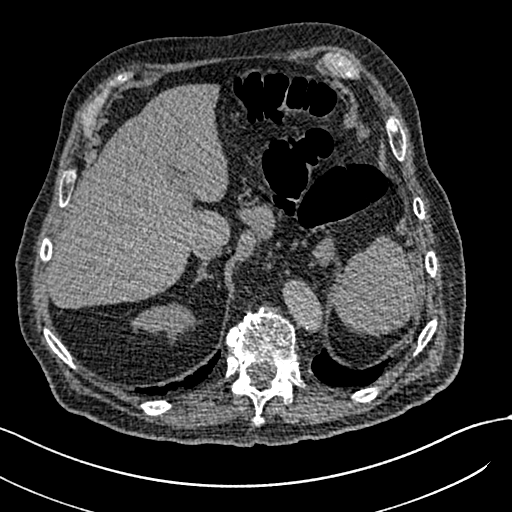
[im 119/396  lung]
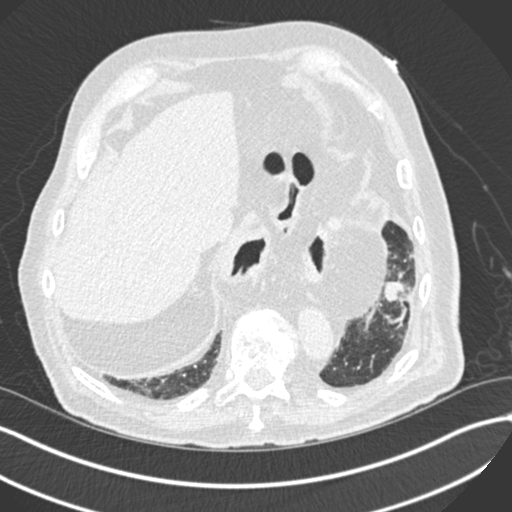
[im 139/396  soft-tissue]
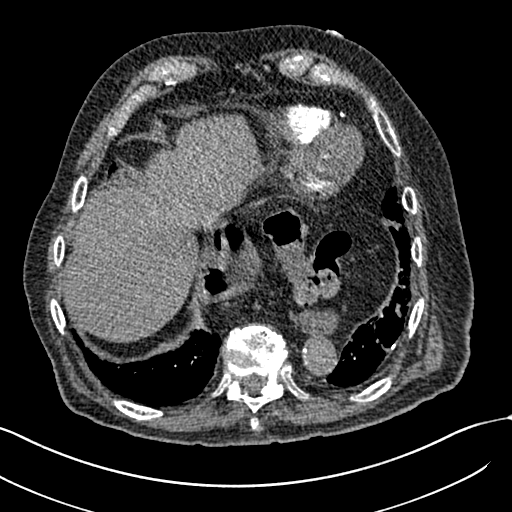
[im 178/396  lung]
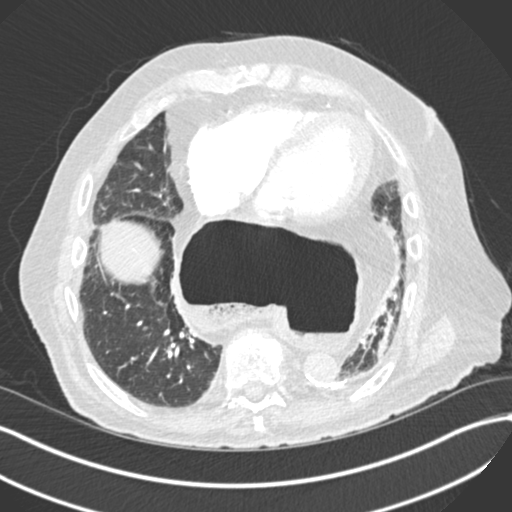
[im 198/396  soft-tissue]
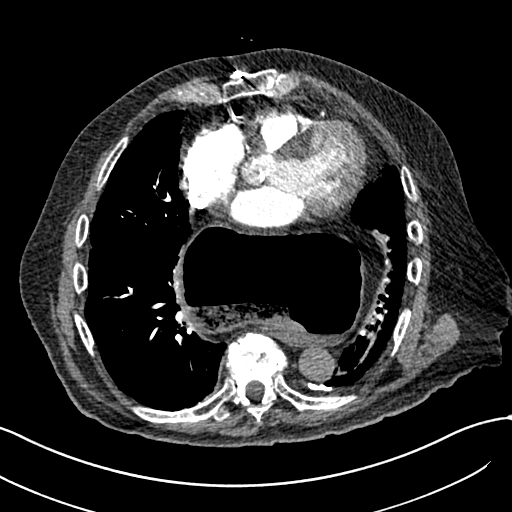
[im 218/396  lung]
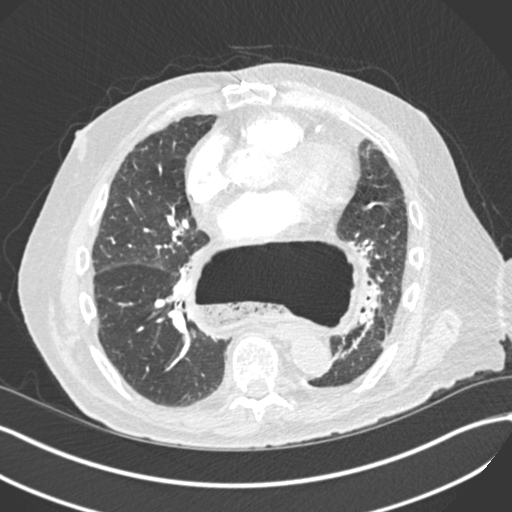
[im 257/396  soft-tissue]
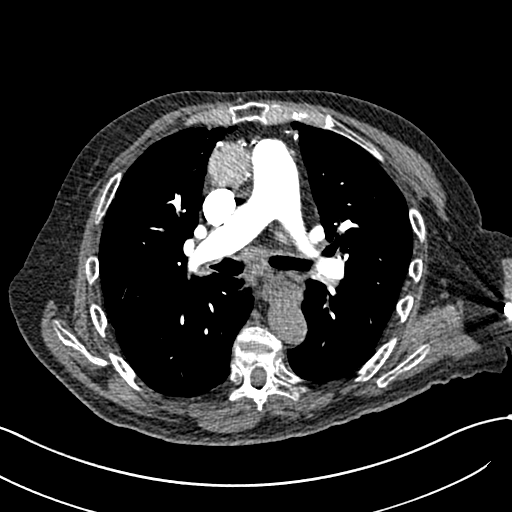
[im 277/396  lung]
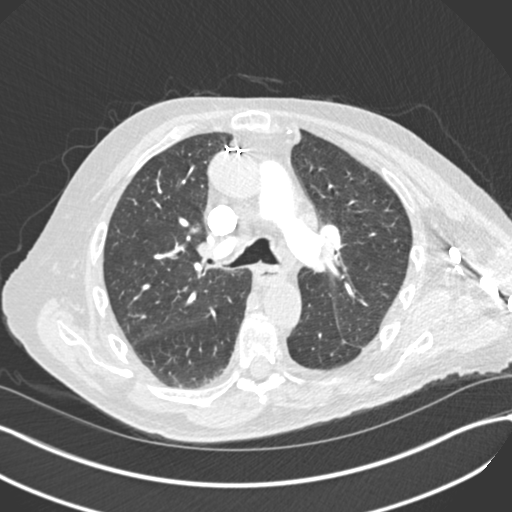
[im 297/396  soft-tissue]
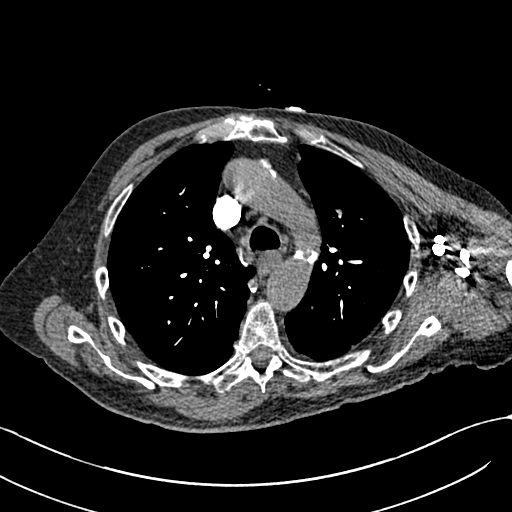
[im 317/396  lung]
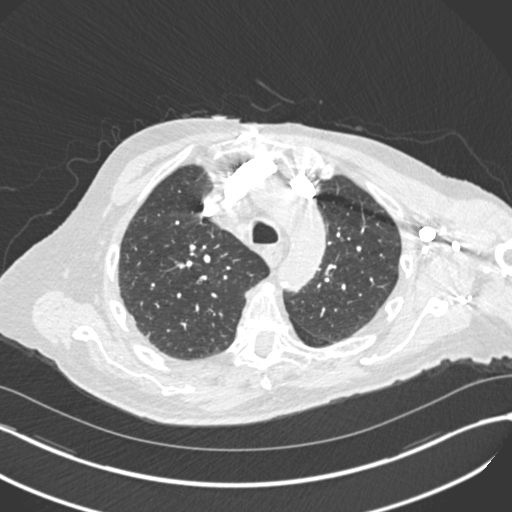
[im 356/396  soft-tissue]
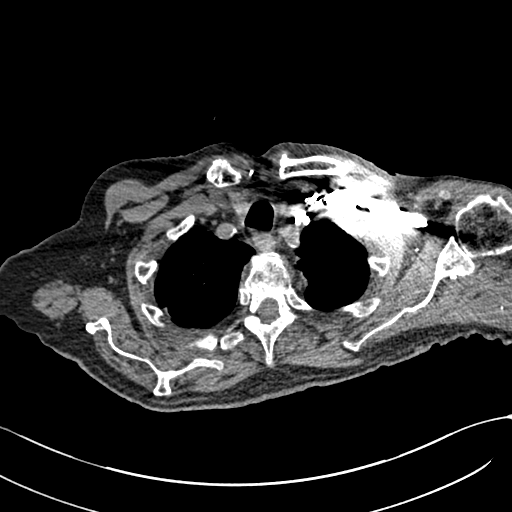
[im 376/396  lung]
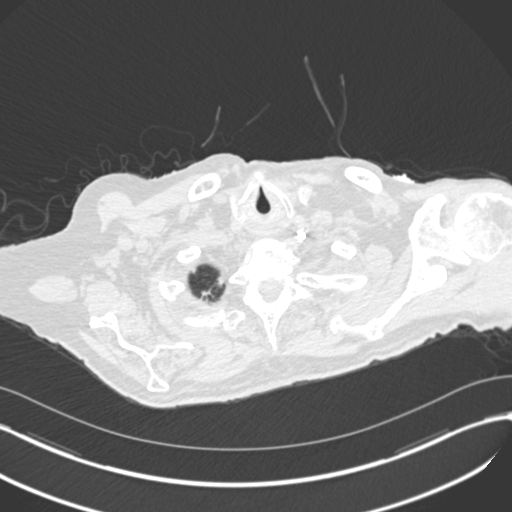

[Series 7: coronal mpr · coronal · 0.62mm/px · 3 of 106 slices shown]
[im 27/106  soft-tissue]
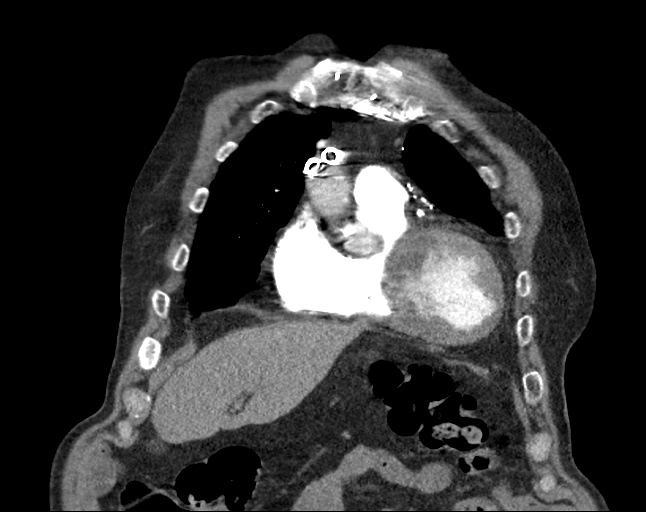
[im 53/106  soft-tissue]
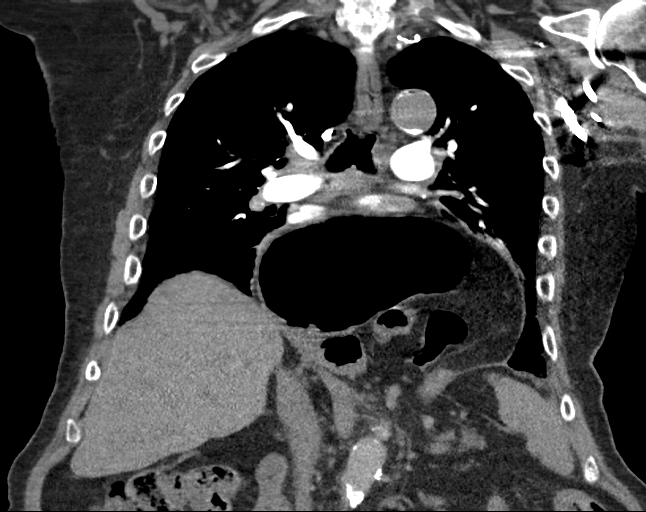
[im 79/106  soft-tissue]
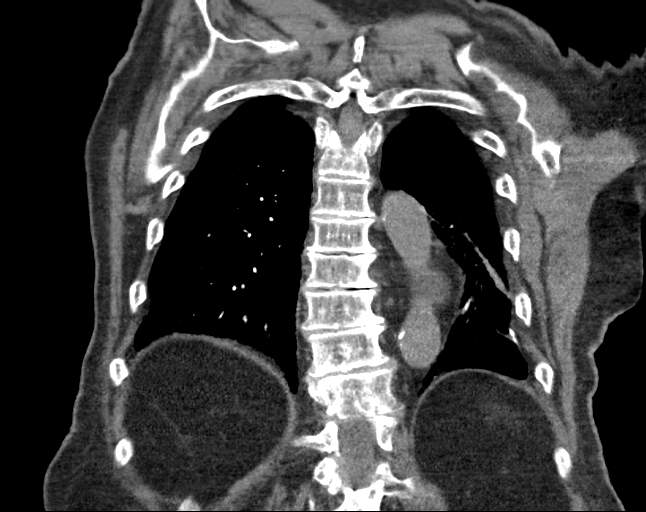

[18 of 46 positions shown; findings below may reference images not displayed]

FINDINGS: Cardiovascular: No filling defects in the pulmonary arteries to
suggest pulmonary emboli. Coronary artery and aortic calcifications.
Prior CABG. No aneurysm.

Mediastinum/Nodes: No mediastinal, hilar, or axillary adenopathy.
Large hiatal hernia. Thyroid unremarkable.

Lungs/Pleura: Biapical scarring. No nodule in the area concern in
the right mid lung. There is a tiny right upper lobe nodule
measuring 2-3 mm on image 32. Scattered small calcified granulomas.

Upper Abdomen: Imaging into the upper abdomen demonstrates no acute
findings. Calcification in the right hepatic dome, likely granuloma.

Musculoskeletal: Chest wall soft tissues are unremarkable. No acute
bony abnormality.

Review of the MIP images confirms the above findings.
IMPRESSION: No suspicious pulmonary nodule in the area of concern in the right
mid lung.

Old granulomatous disease.

2-3 mm right upper lobe pulmonary nodule. No follow-up needed if
patient is low-risk. Non-contrast chest CT can be considered in 12
months if patient is high-risk. This recommendation follows the
consensus statement: Guidelines for Management of Incidental
Pulmonary Nodules Detected on CT Images: From the [HOSPITAL]

Large hiatal hernia

Aortic Atherosclerosis ([SM]-[SM]).

## 2021-11-24 MED ORDER — SODIUM CHLORIDE 0.9 % IV BOLUS (SEPSIS): 500.0000 mL | Freq: Once | INTRAVENOUS | Status: DC

## 2021-11-24 MED ORDER — IOHEXOL 350 MG/ML SOLN
75.0000 mL | Freq: Once | INTRAVENOUS | Status: AC | PRN
  Administered 2021-11-24: 75 mL via INTRAVENOUS

## 2021-11-24 NOTE — ED Provider Notes (Signed)
[facility] Provider Note    Event Date/Time   First MD Initiated Contact with Patient 11/24/21 [phone]     (approximate)   History   Chest Pain   HPI  Devin Reese is a 88 y.o. male with history of CAD, COPD, hypertension, diabetes, hyperlipidemia, CVA, atrial flutter on Eliquis who presents to the emergency department with his wife and daughter after he complained of not feeling well earlier today.  Patient is unable to describe this any further.  Family states that he was complaining of chest pain but he denies this now.  No fevers, cough, vomiting, diarrhea, chest pain, abdominal pain, headache.  Patient is just repeatedly asking to go home.    History provided by patient, wife, daughter at bedside.      Past Medical History:  Diagnosis Date   Atrial flutter ([place])    CAD (coronary artery disease)    CKD (chronic kidney disease), stage II    COPD (chronic obstructive pulmonary disease) ([place])    Coronary artery disease    CABG in 1991 at [facility]. Most recent cardiac catheterization in 0350 was complicated by stroke.   Diabetes mellitus without complication ([place])    Hyperlipidemia    Hypertension    Prostate cancer ([place])    Stroke [facility])     Past Surgical History:  Procedure Laterality Date   CARDIAC CATHETERIZATION     CARDIAC SURGERY     CARDIOVERSION  03/29/13   CORONARY ARTERY BYPASS GRAFT      MEDICATIONS:  Prior to Admission medications   Medication Sig Start Date End Date Taking? Authorizing Provider  albuterol (VENTOLIN HFA) 108 (90 Base) MCG/ACT inhaler Inhale 1-2 puffs into the lungs every 4 (four) hours as needed for wheezing or shortness of breath. 10/24/20   Loura Halt A, NP  apixaban (ELIQUIS) 2.5 MG TABS tablet Take 1 tablet (2.5 mg total) by mouth 2 (two) times daily. 11/05/21   Wellington Hampshire, MD  atorvastatin (LIPITOR) 40 MG tablet Take 1 tablet (40 mg total) by mouth daily. 11/05/21 02/03/22  Wellington Hampshire, MD   bisacodyl (DULCOLAX) 5 MG EC tablet Take by mouth.    [provider]  Fluticasone-Umeclidin-Vilant (TRELEGY ELLIPTA IN) Inhale 1 puff into the lungs daily.    [provider]  levothyroxine (SYNTHROID) 25 MCG tablet Take 1 tablet (25 mcg total) by mouth daily before breakfast. 09/26/21   Leone Haven, MD  nitroGLYCERIN (NITROSTAT) 0.4 MG SL tablet Place 1 tablet (0.4 mg total) under the tongue every 5 (five) minutes as needed for chest pain. 03/28/20   Wellington Hampshire, MD  TRADJENTA 5 MG TABS tablet Take 1 tablet (5 mg total) by mouth daily. 11/05/21   Leone Haven, MD    Physical Exam   Triage Vital Signs: ED Triage Vitals  Enc Vitals Group     BP 11/23/21 1701 112/72     Pulse Rate 11/23/21 1701 65     Resp 11/23/21 1701 16     Temp 11/23/21 1701 98.6 F (37 C)     Temp Source 11/23/21 1701 Oral     SpO2 11/23/21 1701 97 %     Weight 11/23/21 1644 169 lb 15.6 oz (77.1 kg)     Height 11/23/21 1644 5\' 10"  (1.778 m)     Head Circumference --      Peak Flow --      Pain Score 11/23/21 1643 0  Pain Loc --      Pain Edu? --      Excl. in [place]? --     Most recent vital signs: Vitals:   11/23/21 2239 11/24/21 0250  BP: 135/82 107/68  Pulse: 89 82  Resp: 18 17  Temp:    SpO2: 98% 97%    CONSTITUTIONAL: Alert and oriented and responds appropriately to questions. Well-appearing; well-nourished, elderly, no distress HEAD: Normocephalic, atraumatic EYES: Conjunctivae clear, pupils appear equal, sclera nonicteric ENT: normal nose; moist mucous membranes NECK: Supple, normal ROM CARD: RRR; S1 and S2 appreciated; no murmurs, no clicks, no rubs, no gallops RESP: Normal chest excursion without splinting or tachypnea; breath sounds clear and equal bilaterally; no wheezes, no rhonchi, no rales, no hypoxia or respiratory distress, speaking full sentences ABD/GI: Normal bowel sounds; non-distended; soft, non-tender, no rebound, no guarding, no peritoneal  signs BACK: The back appears normal EXT: Normal ROM in all joints; no deformity noted, no edema; no cyanosis, no calf tenderness or calf swelling SKIN: Normal color for age and race; warm; no rash on exposed skin NEURO: Moves all extremities equally, normal speech, ambulates with normal gait, no facial asymmetry PSYCH: The patient's mood and manner are appropriate.   ED Results / Procedures / Treatments   LABS: (all labs ordered are listed, but only abnormal results are displayed) Labs Reviewed  BASIC METABOLIC PANEL - Abnormal; Notable for the following components:      Result Value   Glucose, Bld 118 (*)    BUN 33 (*)    Creatinine, Ser 1.56 (*)    Calcium 8.7 (*)    GFR, Estimated 41 (*)    All other components within normal limits  CBC - Abnormal; Notable for the following components:   RBC 3.93 (*)    Hemoglobin 12.4 (*)    HCT 38.2 (*)    All other components within normal limits  URINALYSIS, ROUTINE W REFLEX MICROSCOPIC - Abnormal; Notable for the following components:   Color, Urine YELLOW (*)    APPearance HAZY (*)    Specific Gravity, Urine >1.046 (*)    Hgb urine dipstick MODERATE (*)    Bacteria, UA FEW (*)    All other components within normal limits  TROPONIN I (HIGH SENSITIVITY) - Abnormal; Notable for the following components:   Troponin I (High Sensitivity) 18 (*)    All other components within normal limits  TROPONIN I (HIGH SENSITIVITY) - Abnormal; Notable for the following components:   Troponin I (High Sensitivity) 18 (*)    All other components within normal limits  RESP PANEL BY RT-PCR (FLU A&B, COVID) [id number]     EKG:  EKG Interpretation  Date/Time:    Ventricular Rate:    PR Interval:    QRS Duration:   QT Interval:    QTC Calculation:   R Axis:     Text Interpretation:           RADIOLOGY: My personal review and interpretation of imaging: Chest x-ray clear.  CTA chest shows no PE.  I have personally reviewed all radiology  reports.   DG Chest 2 View  Result Date: 11/23/2021 CLINICAL DATA:  Chest pain EXAM: CHEST - 2 VIEW COMPARISON:  10/24/20 FINDINGS: Signs of previous median sternotomy and CABG procedure. Stable cardiomediastinal contours. There is a large hiatal hernia with air-fluid level identified. This is similar in size to the previous exam. No signs of pleural effusion or edema. No acute airspace consolidation. Nodular density within the  periphery of the right mid lung appears new from previous exam measuring 1.9 cm, indeterminate. The visualized osseous structures are notable for multilevel degenerative disc disease. IMPRESSION: 1. No acute cardiopulmonary abnormalities. 2. Indeterminate nodular density within the periphery of the right mid lung. Consider further evaluation with CT. 3. Large hiatal hernia. There is a new air-fluid level identified within this hernia, of uncertain clinical significance. Electronically Signed   By: Kerby Moors M.D.   On: 11/23/2021 17:26   CT Angio Chest PE W and/or Wo Contrast  Result Date: 11/24/2021 CLINICAL DATA:  Pulmonary embolism (PE) suspected, unknown D-dimer mass on CXR EXAM: CT ANGIOGRAPHY CHEST WITH CONTRAST TECHNIQUE: Multidetector CT imaging of the chest was performed using the standard protocol during bolus administration of intravenous contrast. Multiplanar CT image reconstructions and MIPs were obtained to evaluate the vascular anatomy. CONTRAST:  11mL OMNIPAQUE IOHEXOL 350 MG/ML SOLN COMPARISON:  Chest x-ray today FINDINGS: Cardiovascular: No filling defects in the pulmonary arteries to suggest pulmonary emboli. Coronary artery and aortic calcifications. Prior CABG. No aneurysm. Mediastinum/Nodes: No mediastinal, hilar, or axillary adenopathy. Large hiatal hernia. Thyroid unremarkable. Lungs/Pleura: Biapical scarring. No nodule in the area concern in the right mid lung. There is a tiny right upper lobe nodule measuring 2-3 mm on image 32. Scattered small calcified  granulomas. Upper Abdomen: Imaging into the upper abdomen demonstrates no acute findings. Calcification in the right hepatic dome, likely granuloma. Musculoskeletal: Chest wall soft tissues are unremarkable. No acute bony abnormality. Review of the MIP images confirms the above findings. IMPRESSION: No suspicious pulmonary nodule in the area of concern in the right mid lung. Old granulomatous disease. 2-3 mm right upper lobe pulmonary nodule. No follow-up needed if patient is low-risk. Non-contrast chest CT can be considered in 12 months if patient is high-risk. This recommendation follows the consensus statement: Guidelines for Management of Incidental Pulmonary Nodules Detected on CT Images: From the Fleischner Society 2017; Radiology 2017; 284:228-243. Large hiatal hernia Aortic Atherosclerosis (ICD10-I70.0). Electronically Signed   By: Rolm Baptise M.D.   On: 11/24/2021 01:53     PROCEDURES:  Critical Care performed: No    .1-3 Lead EKG Interpretation Performed by: Greidy Sherard, Delice Bison, DO Authorized by: Culver Feighner, Delice Bison, DO     Interpretation: normal     ECG rate:  80   ECG rate assessment: normal     Rhythm: sinus rhythm     Ectopy: none     Conduction: normal      IMPRESSION / MDM / ASSESSMENT AND PLAN / ED COURSE  I reviewed the triage vital signs and the nursing notes.    Patient here with complaints of not feeling well earlier today with nonspecific chest pain now completely asymptomatic.  The patient is on the cardiac monitor to evaluate for evidence of arrhythmia and/or significant heart rate changes.   DIFFERENTIAL DIAGNOSIS (includes but not limited to):   ACS, PE, dissection, pneumonia, pneumothorax, CHF, viral illness, UTI, dehydration, anemia, electrolyte derangement, deconditioning   PLAN: We will obtain labs, urine, chest x-ray, EKG, cardiac enzymes.   MEDICATIONS GIVEN IN ED: Medications  sodium chloride 0.9 % bolus 500 mL (500 mLs Intravenous Patient Refused/Not  Given 11/24/21 0250)  iohexol (OMNIPAQUE) 350 MG/ML injection 75 mL (75 mLs Intravenous Contrast Given 11/24/21 0132)     ED COURSE: Patient's labs show mild anemia which is stable.  He has chronic kidney disease which is stable.  Normal electrolytes.  Troponin x2 flat.  Urine shows no sign of  infection or dehydration.  Chest x-ray reviewed by myself and radiologist shows no acute abnormality but does show an indeterminate nodular density in the right midlung and radiologist recommends evaluation with CT scan.  We will proceed with CT for further evaluation.  He also has noted to have a large hiatal hernia with a new air-fluid level within the hernia of uncertain clinical significance.  He denies any abdominal pain in his abdomen is benign.  He has been tolerating p.o.  I do not feel he needs imaging of his abdomen today.    Patient CT scan read by myself and radiologist and shows no PE or infiltrate.  There is no suspicious nodule in the right midlung to correlate with what was seen on x-ray.  He does have a 2 to 3 mm right upper lobe pulmonary nodule.  Discussed this finding with patient and family and recommend follow-up with their PCP in 12 months for repeat imaging to ensure stability.  Patient states he is feeling better and has no complaints.  They would like to be discharged home which I feel is reasonable.  Recommended follow-up with primary care if symptoms return.  Patient does not require any prescription medications or any medications here in the ED given he is currently asymptomatic.    At this time, I do not feel there is any life-threatening condition present. I reviewed all nursing notes, vitals, pertinent previous records.  All labs, EKGs, imaging ordered have been independently reviewed and interpreted by myself.  I have reviewed nursing notes and appropriate previous records.  I feel the patient is safe to be discharged home without further emergent workup and can continue workup as an  outpatient as needed. Discussed all findings and treatment plan with patient, wife and daughter as well as usual and customary return precautions.  They verbalize understanding and are comfortable with this plan.  Outpatient follow-up has been provided as needed. All questions have been answered.   CONSULTS: No admission or consultation for cardiology needed at this time given troponins flat, EKG shows no new ischemic change, chest x-ray and CT scan show no acute abnormality and patient now asymptomatic.   OUTSIDE RECORDS REVIEWED: Reviewed patient's previous cardiology note with Dr. Mariea Clonts on 09/28/2021.  Patient has had a CABG in [facility] at [facility].  He had atrial flutter in 2014 with successful cardioversion and has had no recurrent arrhythmia since that time.  Echo in 2018 showed EF of 40 to 45%.  Myoview in August 2018 showed no significant ischemia.         FINAL CLINICAL IMPRESSION(S) / ED DIAGNOSES   Final diagnoses:  Atypical chest pain     Rx / DC Orders   ED Discharge Orders     None        Note:  This document was prepared using Dragon voice recognition software and may include unintentional dictation errors.   Olly Shiner, Delice Bison, DO 11/24/21 [phone]

## 2021-11-28 DIAGNOSIS — M1712 Unilateral primary osteoarthritis, left knee: Secondary | ICD-10-CM | POA: Diagnosis not present

## 2021-11-28 DIAGNOSIS — M25562 Pain in left knee: Secondary | ICD-10-CM | POA: Diagnosis not present

## 2021-11-29 ENCOUNTER — Other Ambulatory Visit: Payer: Self-pay | Admitting: Family Medicine

## 2021-11-30 ENCOUNTER — Telehealth: Payer: Self-pay

## 2021-11-30 NOTE — Telephone Encounter (Signed)
Patient went to ED to be evaluated.  Consuela Widener,cma

## 2021-12-12 ENCOUNTER — Telehealth: Payer: Self-pay | Admitting: Family Medicine

## 2021-12-12 NOTE — Telephone Encounter (Signed)
Pt wife stated that Pt went to [facility] to get an injection in his knee. Pt wife stated that after Pt received injection in knee that Pt hip started hurting. Pt wife requesting for app. No avail appt. Pt requesting callback

## 2021-12-13 NOTE — Telephone Encounter (Signed)
I called the patient and he is scheduled for a visit with the provider.  Almarosa Bohac,cma

## 2021-12-18 ENCOUNTER — Ambulatory Visit (INDEPENDENT_AMBULATORY_CARE_PROVIDER_SITE_OTHER): Payer: Medicare HMO | Admitting: Family Medicine

## 2021-12-18 ENCOUNTER — Other Ambulatory Visit: Payer: Self-pay

## 2021-12-18 ENCOUNTER — Ambulatory Visit (INDEPENDENT_AMBULATORY_CARE_PROVIDER_SITE_OTHER): Payer: Medicare HMO

## 2021-12-18 ENCOUNTER — Encounter: Payer: Self-pay | Admitting: Family Medicine

## 2021-12-18 VITALS — BP 122/80 | HR 84 | Temp 97.6°F | Ht 70.0 in | Wt 165.0 lb

## 2021-12-18 DIAGNOSIS — R911 Solitary pulmonary nodule: Secondary | ICD-10-CM

## 2021-12-18 DIAGNOSIS — M25552 Pain in left hip: Secondary | ICD-10-CM

## 2021-12-18 DIAGNOSIS — M79652 Pain in left thigh: Secondary | ICD-10-CM | POA: Diagnosis not present

## 2021-12-18 DIAGNOSIS — W19XXXA Unspecified fall, initial encounter: Secondary | ICD-10-CM

## 2021-12-18 DIAGNOSIS — M545 Low back pain, unspecified: Secondary | ICD-10-CM

## 2021-12-18 DIAGNOSIS — M21372 Foot drop, left foot: Secondary | ICD-10-CM | POA: Diagnosis not present

## 2021-12-18 DIAGNOSIS — M4126 Other idiopathic scoliosis, lumbar region: Secondary | ICD-10-CM | POA: Diagnosis not present

## 2021-12-18 DIAGNOSIS — Z01818 Encounter for other preprocedural examination: Secondary | ICD-10-CM | POA: Diagnosis not present

## 2021-12-18 DIAGNOSIS — M2578 Osteophyte, vertebrae: Secondary | ICD-10-CM | POA: Diagnosis not present

## 2021-12-18 IMAGING — DX DG ORBITS FOR FOREIGN BODY
2 series · 2 of 2 positions shown · non-contrast
Comparison: Head CT [DATE]

CLINICAL DATA: Metal working/exposure; clearance prior to MRI

EXAM:
ORBITS FOR FOREIGN BODY - 2 VIEW

[waters pa (1 of 2)]
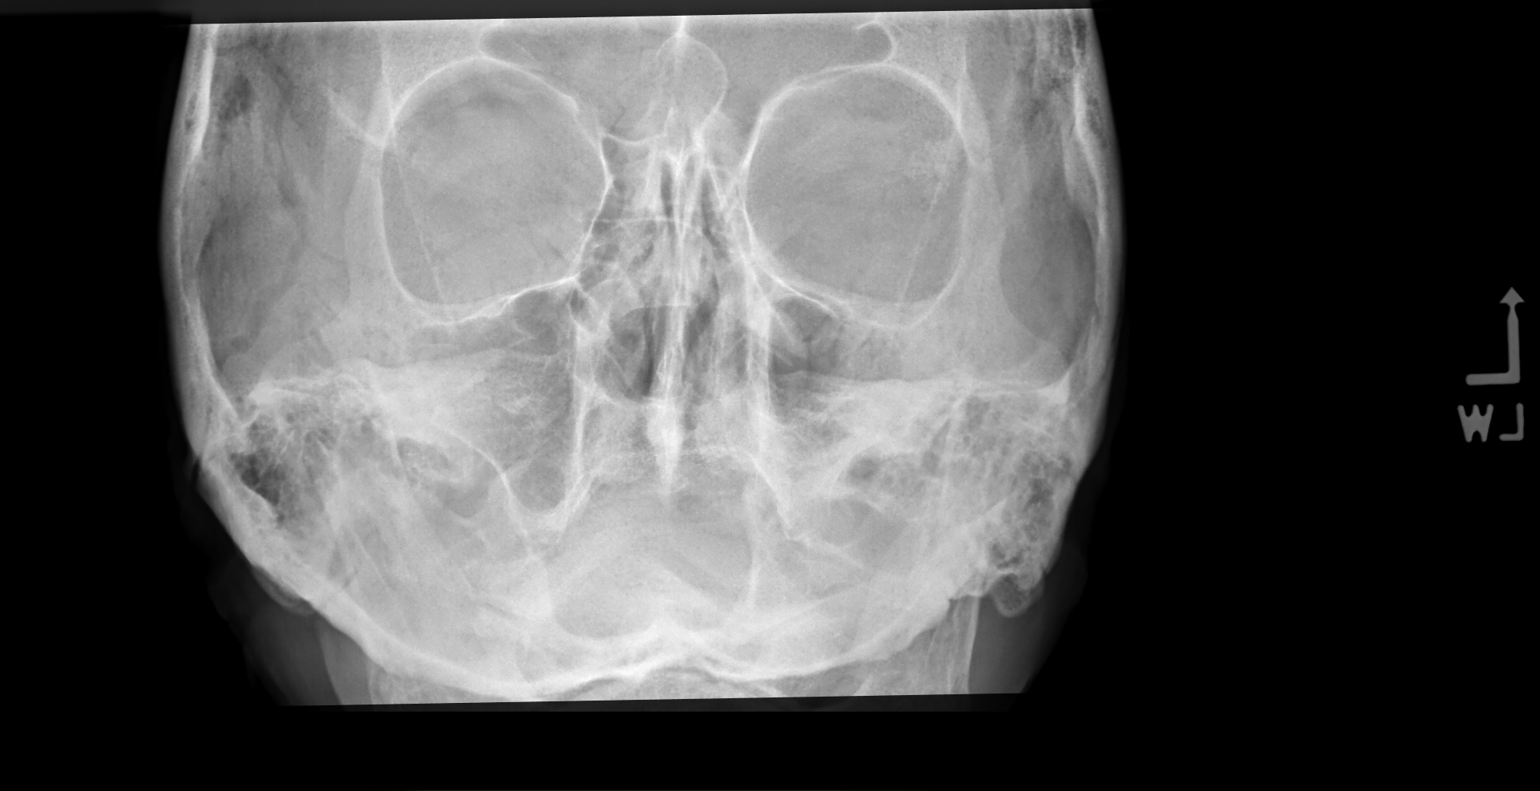

[waters pa (2 of 2)]
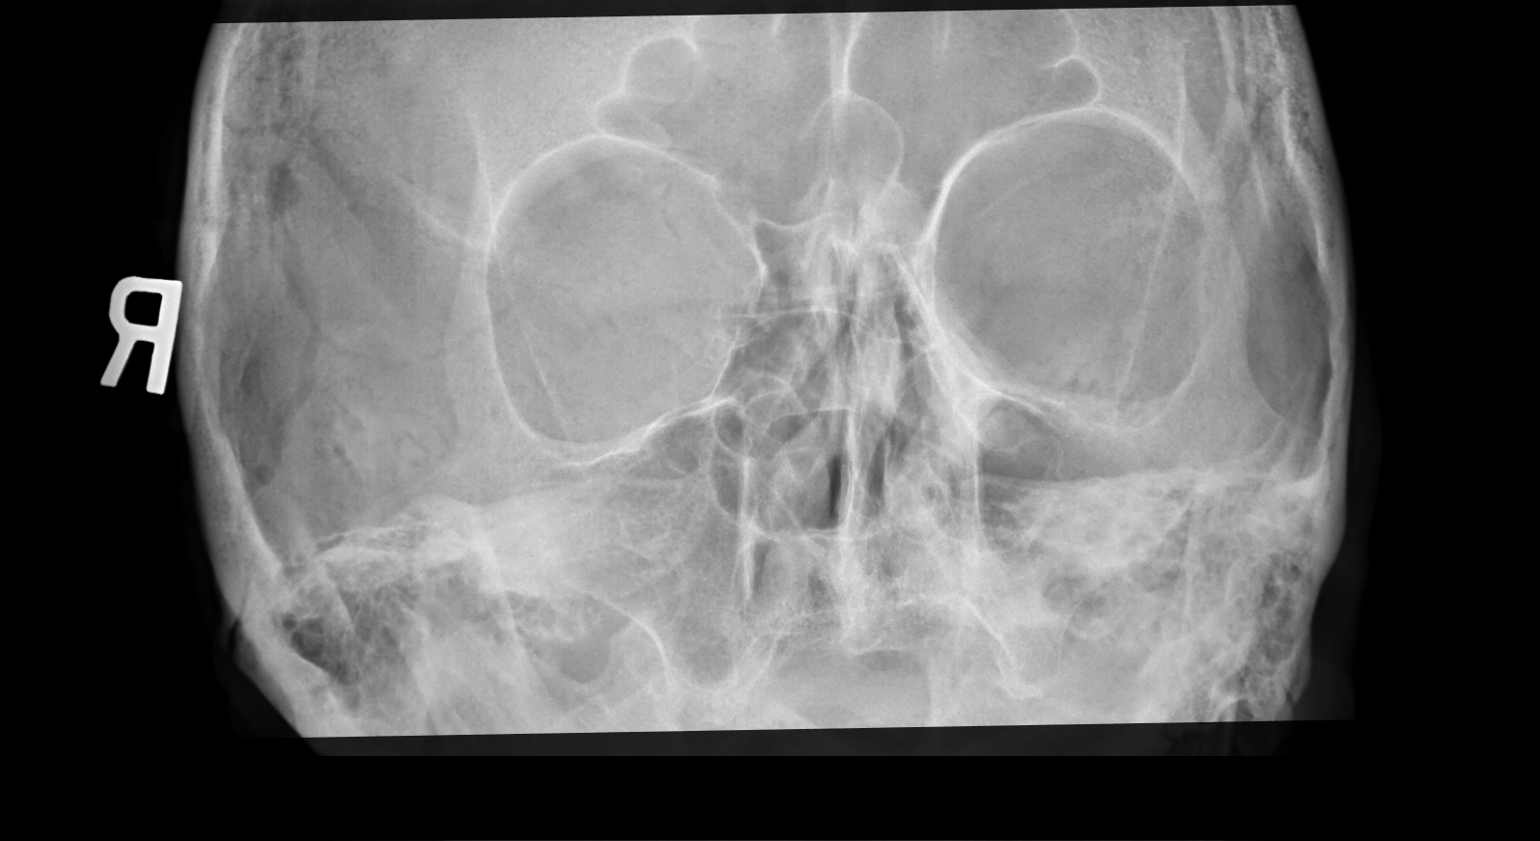

[2 of 2 positions shown; findings below may reference images not displayed]

FINDINGS: There is no evidence of metallic foreign body within the orbits. No
significant bone abnormality identified.
IMPRESSION: No evidence of metallic foreign body within the orbits.

## 2021-12-18 IMAGING — DX DG FEMUR 2+V*L*
2 series · 2 of 2 positions shown · non-contrast
Comparison: None.

CLINICAL DATA: Left hip pain.

EXAM:
DG HIP (WITH OR WITHOUT PELVIS) 2-3V LEFT; LEFT FEMUR 2 VIEWS

[upper leg with knee ap]
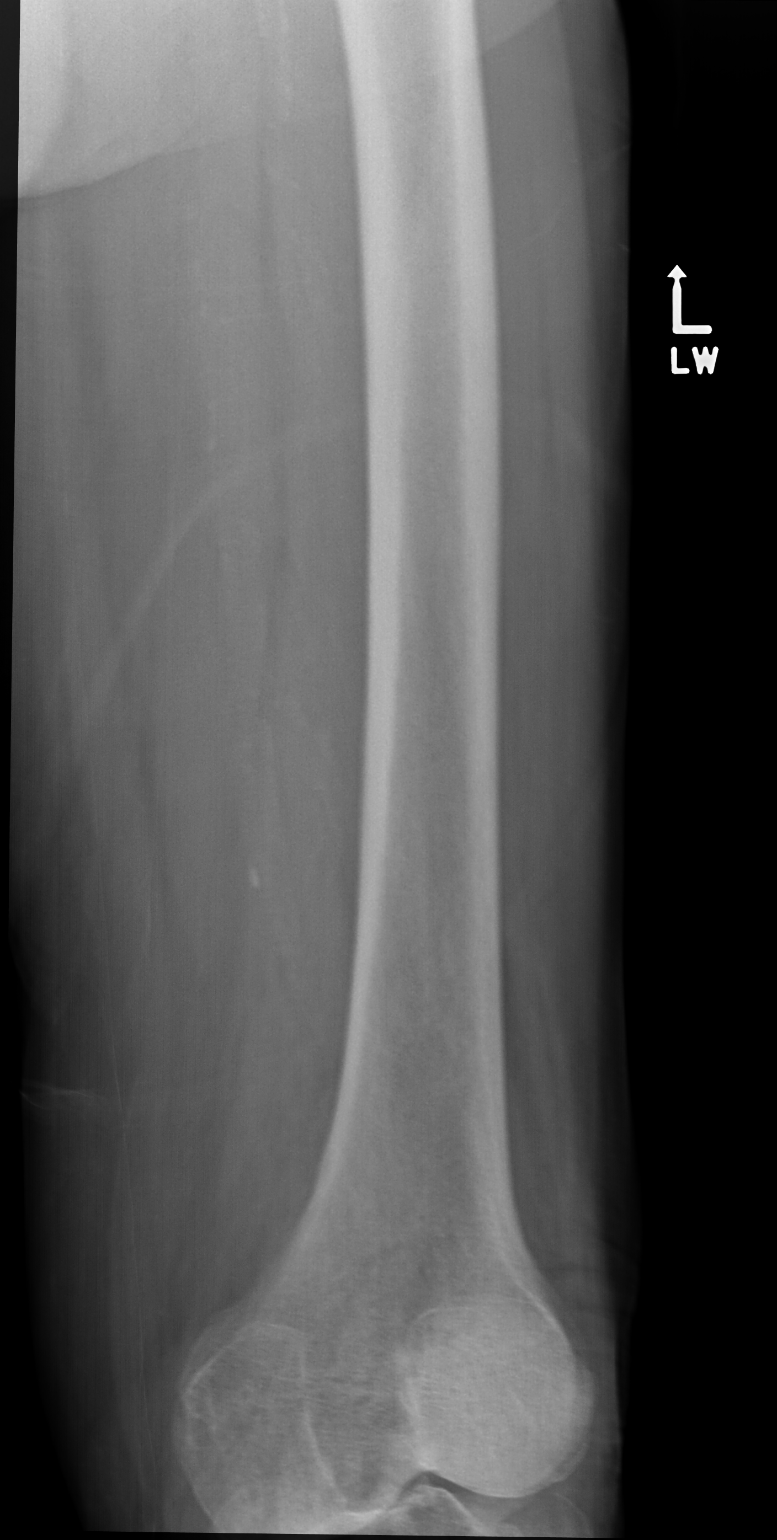

[upper leg with hip ap]
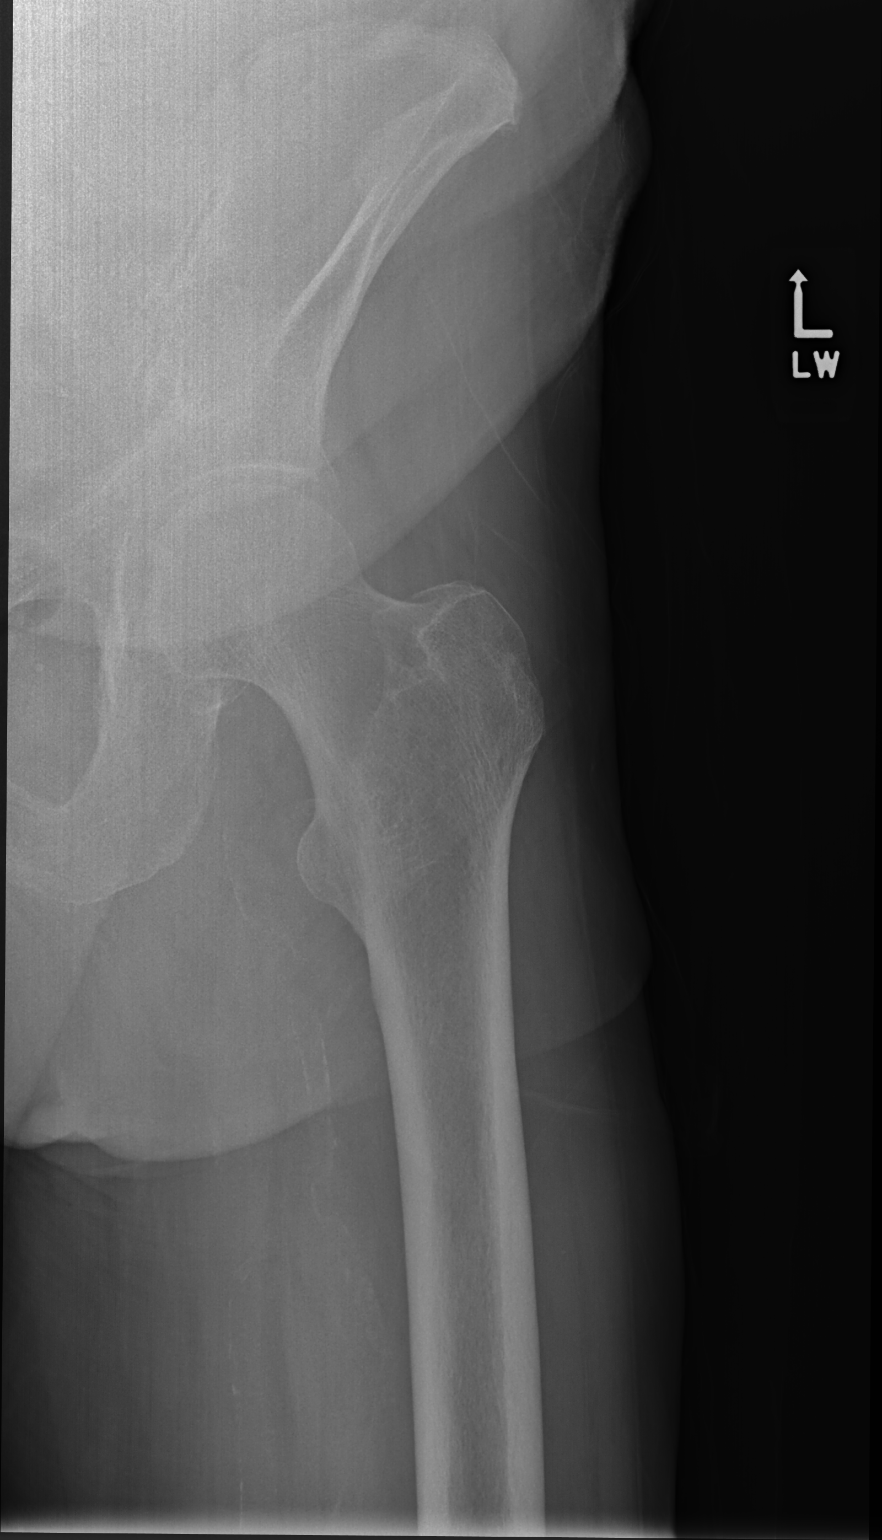

[2 of 2 positions shown; findings below may reference images not displayed]

FINDINGS: Left hip: Bones are osteopenic. There is no evidence of hip fracture
or dislocation. There is no evidence of arthropathy or other focal
bone abnormality. There are vascular calcifications in the soft
tissues.

Left femur: Bones are osteopenic. There is no evidence of hip
fracture or dislocation. There is no evidence of arthropathy or
other focal bone abnormality. There are vascular calcifications in
the soft tissues.
IMPRESSION: 1. No acute fracture or dislocation of the left hip or left femur.
2. Diffuse osteopenia.

## 2021-12-18 IMAGING — DX DG HIP (WITH OR WITHOUT PELVIS) 2-3V*L*
3 series · 3 of 3 positions shown · non-contrast
Comparison: None.

CLINICAL DATA: Left hip pain.

EXAM:
DG HIP (WITH OR WITHOUT PELVIS) 2-3V LEFT; LEFT FEMUR 2 VIEWS

[pelvis ap]
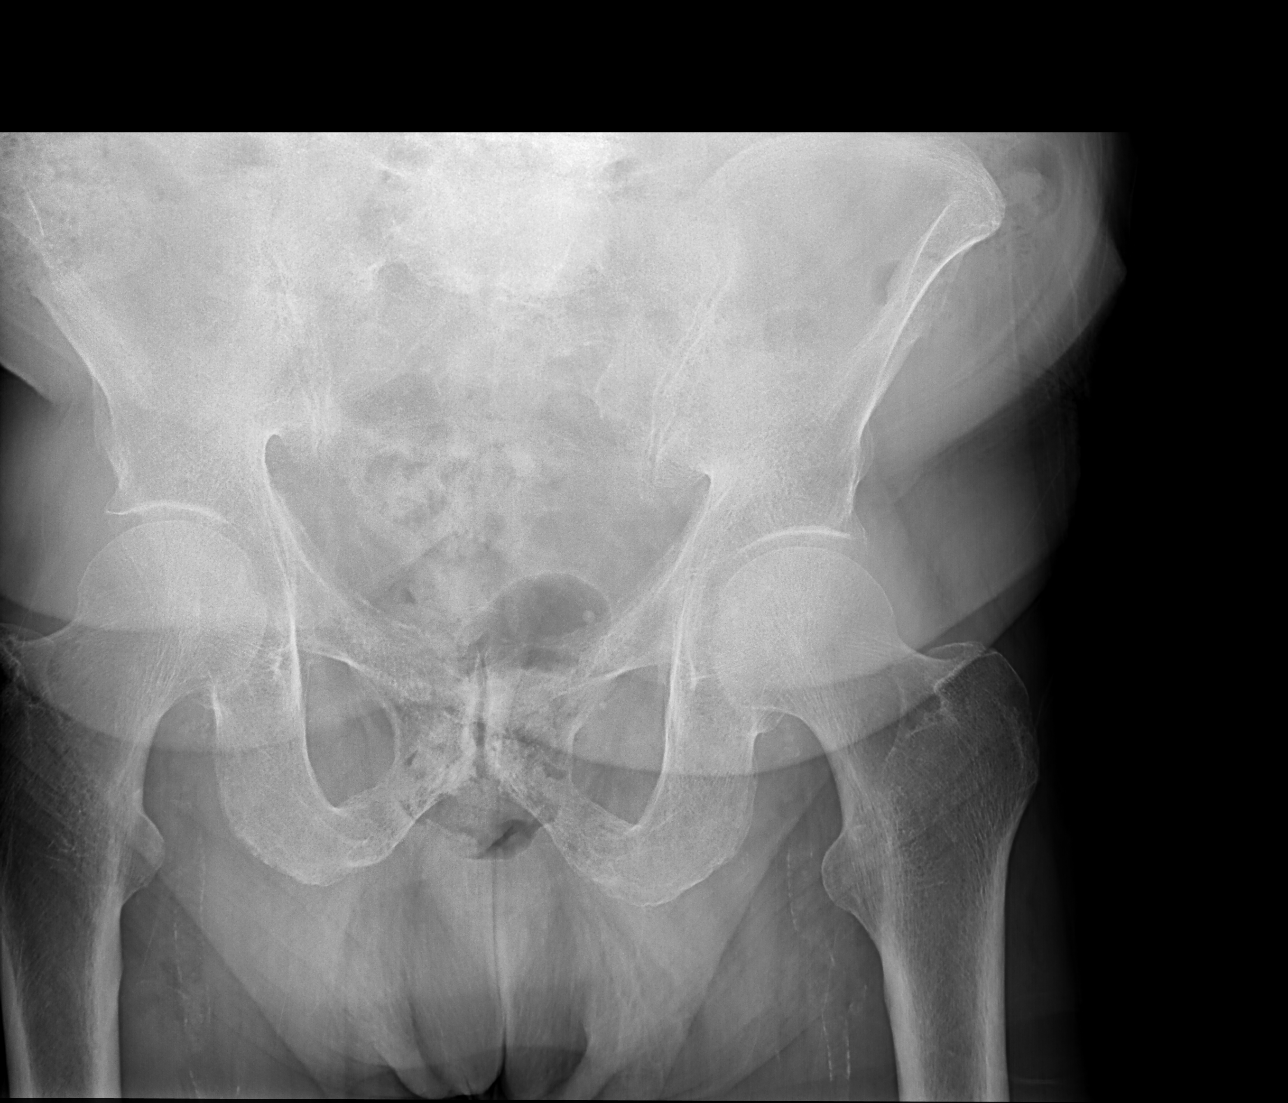

[hip joint ap]
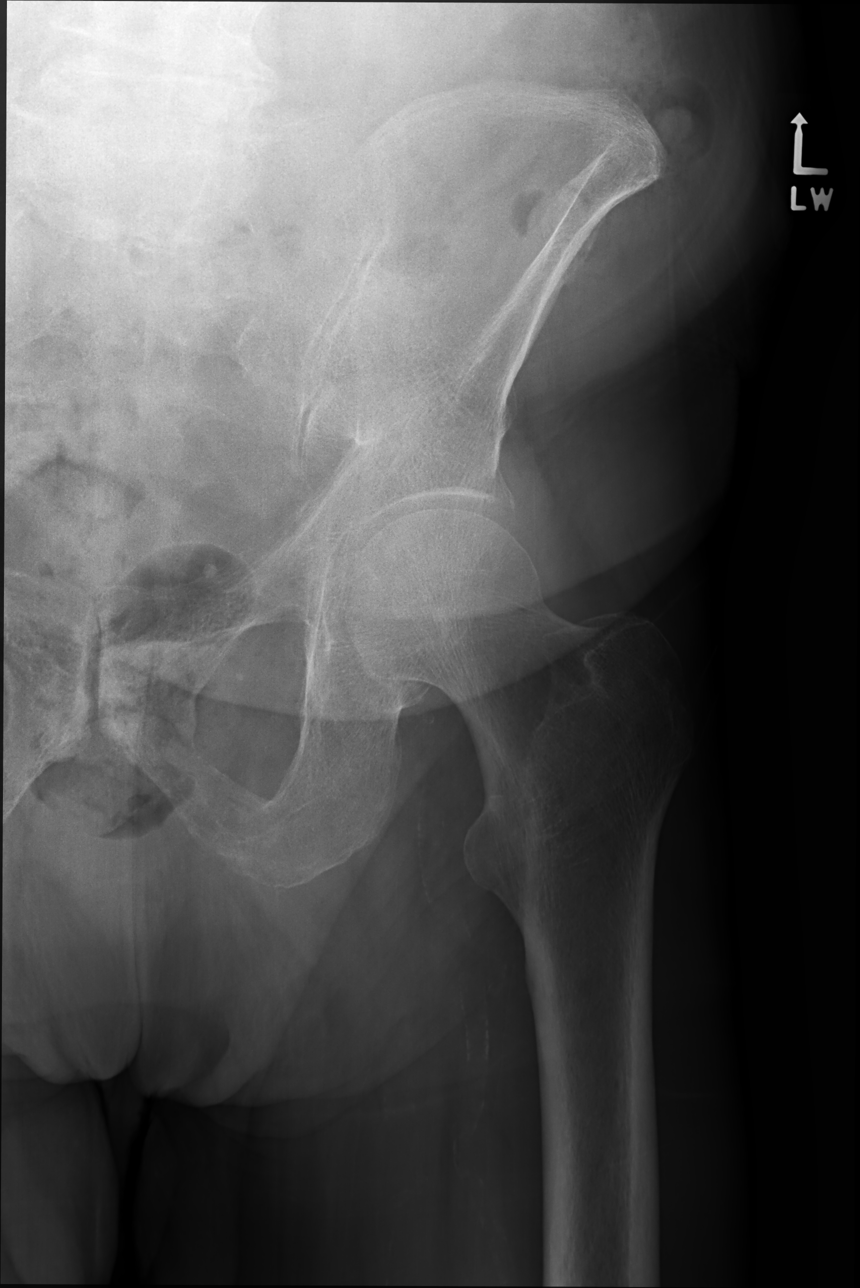

[ap frog obl (oblique)]
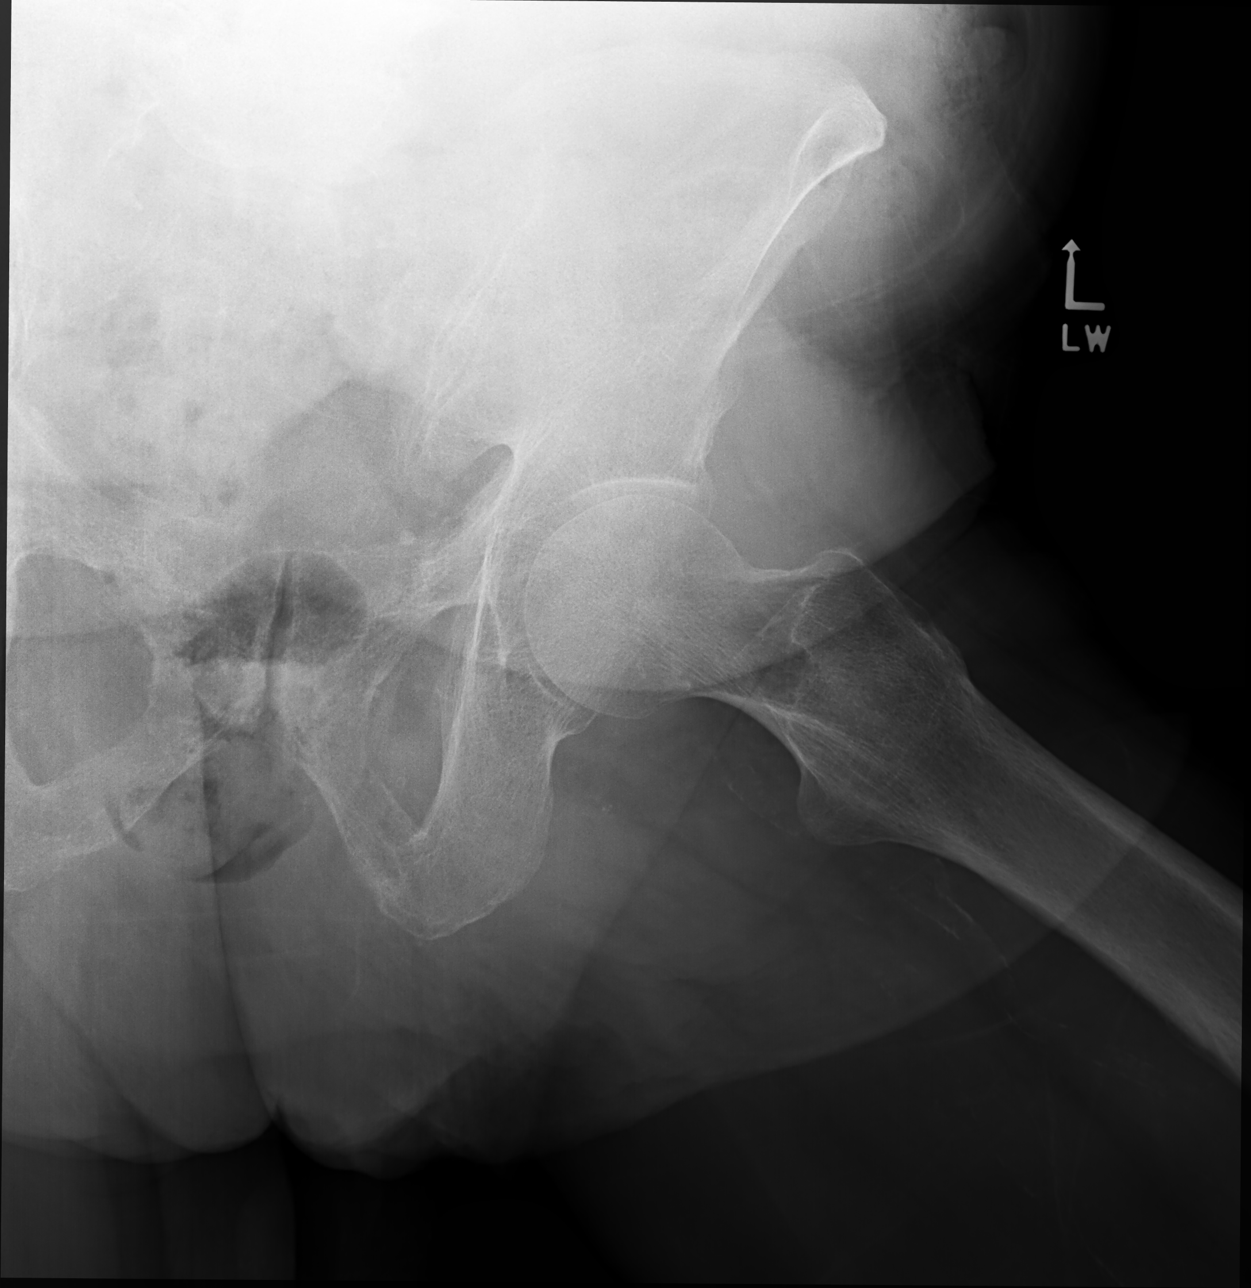

[3 of 3 positions shown; findings below may reference images not displayed]

FINDINGS: Left hip: Bones are osteopenic. There is no evidence of hip fracture
or dislocation. There is no evidence of arthropathy or other focal
bone abnormality. There are vascular calcifications in the soft
tissues.

Left femur: Bones are osteopenic. There is no evidence of hip
fracture or dislocation. There is no evidence of arthropathy or
other focal bone abnormality. There are vascular calcifications in
the soft tissues.
IMPRESSION: 1. No acute fracture or dislocation of the left hip or left femur.
2. Diffuse osteopenia.

## 2021-12-18 IMAGING — DX DG LUMBAR SPINE COMPLETE 4+V
5 series · 5 of 5 positions shown · non-contrast
Comparison: CT chest [DATE].

CLINICAL DATA: Left low back pain after fall.

EXAM:
LUMBAR SPINE - COMPLETE 4+ VIEW

[lumbar spine ap]
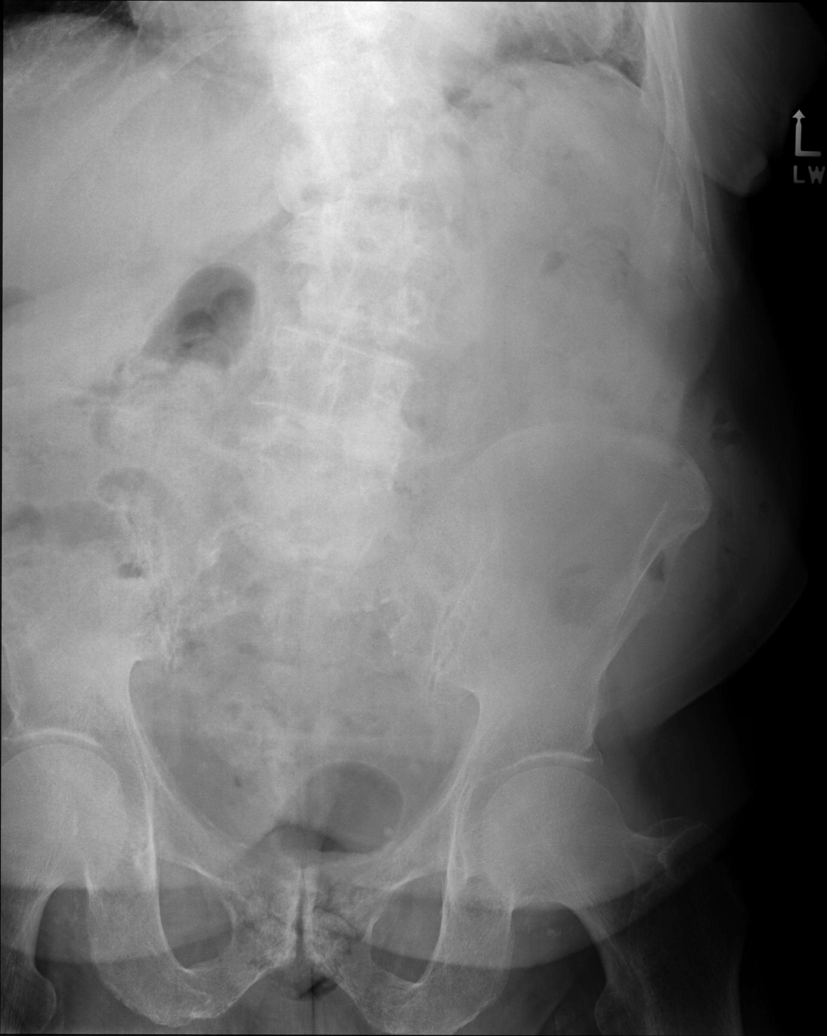

[lumbar spine obl (oblique) (1 of 2)]
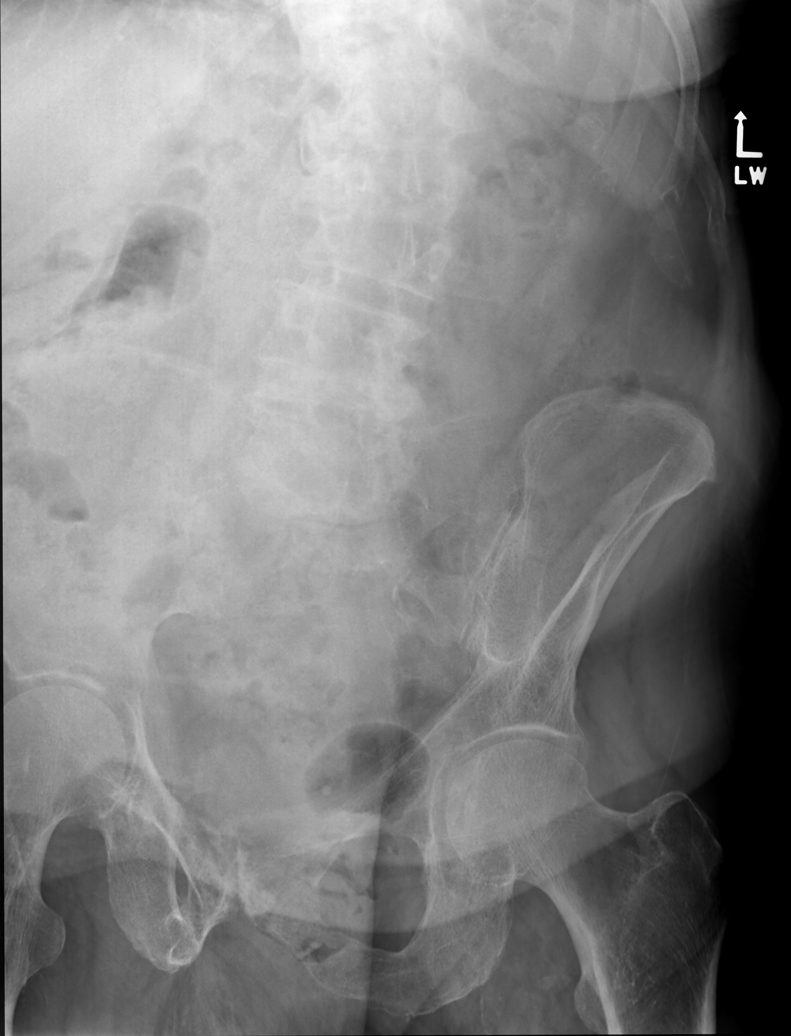

[lumbar spine obl (oblique) (2 of 2)]
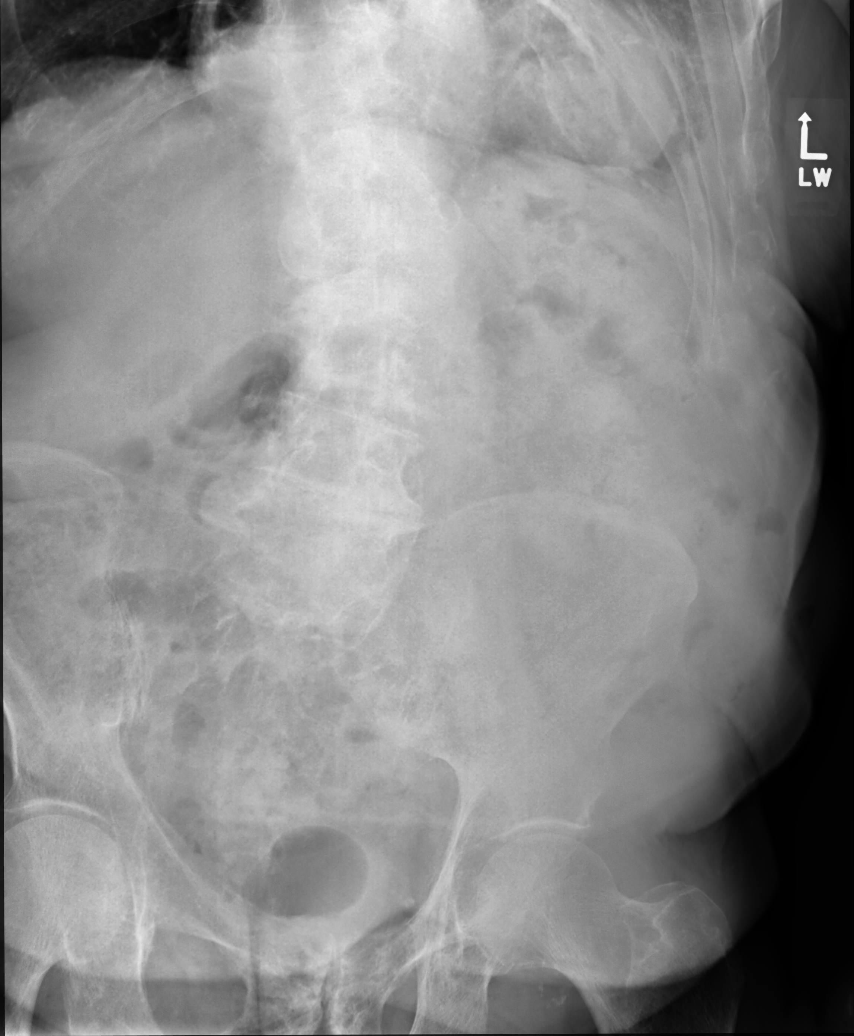

[lumbar spine lat]
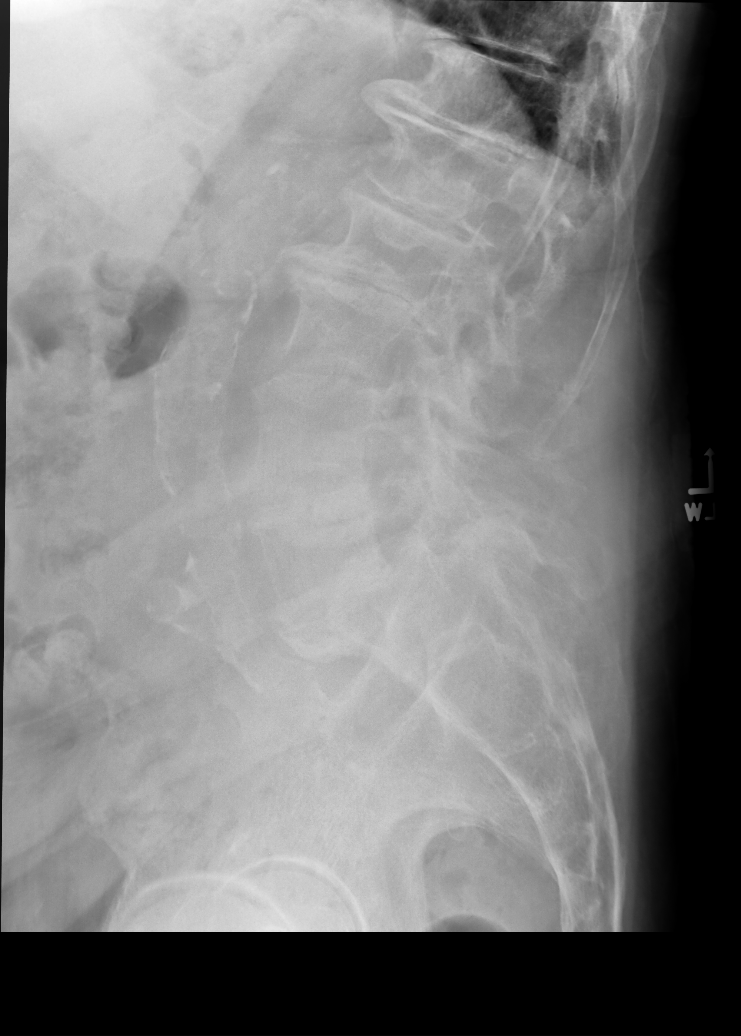

[lumbar spot lat]
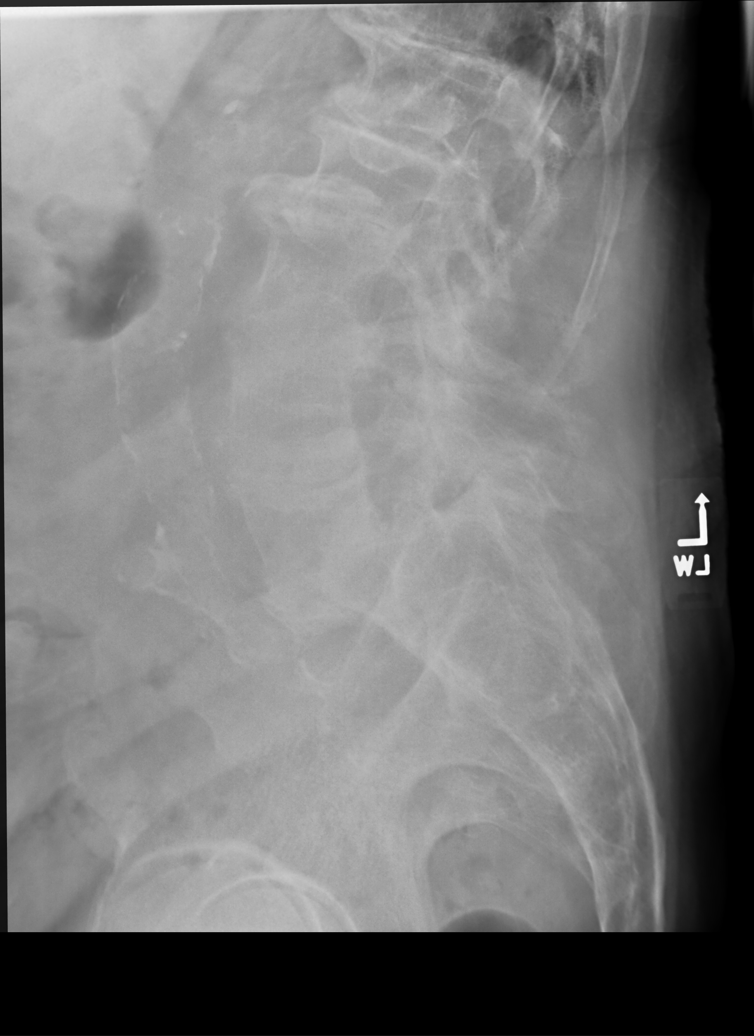

[5 of 5 positions shown; findings below may reference images not displayed]

FINDINGS: Lumbar spine numbered the lowest segmented appearing lumbar shaped
vertebra as L5. Lumbar scoliosis concave right. Diffuse severe
multilevel disc degeneration and endplate osteophyte formation.
Diffuse facet hypertrophy. Prominent 19 mm anterolisthesis L5 on S1.
No evidence of fracture. Pelvic calcifications consistent with
phleboliths. Aortoiliac atherosclerotic vascular calcification.
Large amount of stool noted in the colon and rectum. Left base
pleural scarring again noted.
IMPRESSION: 1. Lumbar spine scoliosis concave right. Diffuse severe multilevel
degenerative change. Prominent 19 mm anterolisthesis L5 on S1. No
evidence of fracture.

2.  Aortoiliac atherosclerotic vascular disease.

3.  Large amount of stool noted the colon rectum.

## 2021-12-18 NOTE — Patient Instructions (Signed)
Nice to see you. We will get x-rays today. Please use your walker at all times. Please see the orthopedist as planned. If you ever fall and hit your head you need to be evaluated immediately.

## 2021-12-18 NOTE — Assessment & Plan Note (Signed)
Noted on exam today.  We are getting a x-ray of his lumbar spine.  Discussed the likelihood of an MRI lumbar spine.  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  We will get an orbital plain film to evaluate that prior to getting an MRI.

## 2021-12-18 NOTE — Assessment & Plan Note (Signed)
Recent falls could be related to his left foot drop.  Its unclear how long this has been present.  We are going to proceed with x-ray imaging today and then likely MRI lumbar spine to evaluate for a cause of his left foot drop.  Discussed using his rolling walker at all times.  Discussed wearing well fitting shoes.  Discussed that if he ever fell and hit his head he would need to go to the emergency room for immediate evaluation given that he is on an anticoagulant.

## 2021-12-18 NOTE — Assessment & Plan Note (Signed)
Related to recent fall.  X-ray lumbar spine.

## 2021-12-18 NOTE — Progress Notes (Signed)
[name], MD Phone: [phone]  Devin Reese is a 88 y.o. male who presents today for same-day visit.  Falls: Patient presents with his daughter and wife.  They report a number of falls over the last few months.  He is walking with a cane but recently started walking with a walker and his daughter notes he is walking better with that.  He denies any dizziness or lightheadedness.  He has had some imbalance.  He wears generally well fitting shoes.  They do not have any rugs in the house.  His most recent fall was a week ago and resulted in left hip pain and low back pain as well as thigh pain.  He has had some knee pain though that has been evaluated by orthopedics and he had an injection in his left knee.  The patient was seen in the emergency department for feeling unwell.  His daughter notes that he was not really able to tell them what was wrong at that time.  He had an extensive work-up that did not reveal a cause for how he was feeling.  He has felt well since then.  He did have an x-ray that revealed a possible nodule and a CT scan revealed a nodule in a different part of his lung.  He does report a history of smoking and a history of prostate cancer.  Social History   Tobacco Use  Smoking Status Former   Packs/day: 1.00   Years: 25.00   Pack years: 25.00   Types: Cigarettes  Smokeless Tobacco Never    Current Outpatient Medications on File Prior to Visit  Medication Sig Dispense Refill   albuterol (VENTOLIN HFA) 108 (90 Base) MCG/ACT inhaler Inhale 1-2 puffs into the lungs every 4 (four) hours as needed for wheezing or shortness of breath. 1 each 0   apixaban (ELIQUIS) 2.5 MG TABS tablet Take 1 tablet (2.5 mg total) by mouth 2 (two) times daily. 60 tablet 6   atorvastatin (LIPITOR) 40 MG tablet Take 1 tablet (40 mg total) by mouth daily. 90 tablet 2   bisacodyl (DULCOLAX) 5 MG EC tablet Take by mouth.     Fluticasone-Umeclidin-Vilant (TRELEGY ELLIPTA IN) Inhale 1  puff into the lungs daily.     levothyroxine (SYNTHROID) 25 MCG tablet Take 1 tablet (25 mcg total) by mouth daily before breakfast. 90 tablet 0   nitroGLYCERIN (NITROSTAT) 0.4 MG SL tablet Place 1 tablet (0.4 mg total) under the tongue every 5 (five) minutes as needed for chest pain. 25 tablet 3   TRADJENTA 5 MG TABS tablet Take 1 tablet (5 mg total) by mouth daily. 30 tablet 0   No current facility-administered medications on file prior to visit.     ROS see history of present illness  Objective  Physical Exam Vitals:   12/18/21 0935  BP: 122/80  Pulse: 84  Temp: 97.6 F (36.4 C)  SpO2: 99%    BP Readings from Last 3 Encounters:  12/18/21 122/80  11/24/21 107/68  10/31/21 105/70   Wt Readings from Last 3 Encounters:  12/18/21 165 lb (74.8 kg)  11/23/21 169 lb 15.6 oz (77.1 kg)  10/31/21 170 lb (77.1 kg)    Physical Exam Constitutional:      General: He is not in acute distress.    Appearance: He is not diaphoretic.  Cardiovascular:     Rate and Rhythm: Normal rate and regular rhythm.     Heart sounds: Normal heart sounds.  Pulmonary:  Effort: Pulmonary effort is normal.     Breath sounds: Normal breath sounds.  Musculoskeletal:     Comments: The patient has left lumbar muscular tenderness, no underlying bruising, he has tenderness over his left lateral hip, he has tenderness over his left lateral thigh, no overlying bruising in either place, he has generally good internal and external range of motion of both of his hips  Skin:    General: Skin is warm and dry.  Neurological:     Mental Status: He is alert.     Comments: 3/5 strength left foot dorsiflexion, 5/5 strength in bilateral biceps, triceps, grip, quads, hamstrings, plantar flexion and right dorsiflexion, sensation to light touch intact in bilateral UE and LE, slight left foot drop on gait     Assessment/Plan: Please see individual problem list.  Problem List Items Addressed This Visit     Acute  left-sided low back pain without sciatica - Primary    Related to recent fall.  X-ray lumbar spine.      Relevant Orders   DG Lumbar Spine Complete   DG Eye Foreign Body   Fall    Recent falls could be related to his left foot drop.  Its unclear how long this has been present.  We are going to proceed with x-ray imaging today and then likely MRI lumbar spine to evaluate for a cause of his left foot drop.  Discussed using his rolling walker at all times.  Discussed wearing well fitting shoes.  Discussed that if he ever fell and hit his head he would need to go to the emergency room for immediate evaluation given that he is on an anticoagulant.      Left foot drop    Noted on exam today.  We are getting a x-ray of his lumbar spine.  Discussed the likelihood of an MRI lumbar spine.  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  We will get an orbital plain film to evaluate that prior to getting an MRI.      Left hip pain    Related to recent fall.  X-ray left hip.      Relevant Orders   DG Hip Unilat W OR W/O Pelvis 2-3 Views Left   Left thigh pain    Related to recent fall.  X-ray left femur.      Relevant Orders   DG FEMUR MIN 2 VIEWS LEFT   Lung nodule seen on imaging study    Given his prior smoking history and his history of prostate cancer he would be high risk.  We will repeat a CT scan in 12 months.       Return in about 3 months (around 03/17/2022).  This visit occurred during the SARS-CoV-2 public health emergency.  Safety protocols were in place, including screening questions prior to the visit, additional usage of staff PPE, and extensive cleaning of exam room while observing appropriate contact time as indicated for disinfecting solutions.   I have spent 40 minutes in the care of this patient regarding history taking, documentation, completion of exam, review of recent ED visit, placing orders, counseling on falls, discussion of  plan.   [name], MD [place]

## 2021-12-18 NOTE — Assessment & Plan Note (Signed)
Related to recent fall.  X-ray left hip.

## 2021-12-18 NOTE — Assessment & Plan Note (Signed)
Related to recent fall.  X-ray left femur.

## 2021-12-18 NOTE — Assessment & Plan Note (Signed)
Given his prior smoking history and his history of prostate cancer he would be high risk.  We will repeat a CT scan in 12 months.

## 2021-12-22 ENCOUNTER — Other Ambulatory Visit: Payer: Self-pay | Admitting: Family Medicine

## 2021-12-22 DIAGNOSIS — M21372 Foot drop, left foot: Secondary | ICD-10-CM

## 2022-01-01 ENCOUNTER — Ambulatory Visit
Admission: RE | Admit: 2022-01-01 | Discharge: 2022-01-01 | Disposition: A | Discharge status: Home / Self Care | Payer: Medicare HMO | Source: Ambulatory Visit | Attending: Family Medicine | Admitting: Family Medicine

## 2022-01-01 DIAGNOSIS — M21372 Foot drop, left foot: Secondary | ICD-10-CM | POA: Insufficient documentation

## 2022-01-01 DIAGNOSIS — M47816 Spondylosis without myelopathy or radiculopathy, lumbar region: Secondary | ICD-10-CM | POA: Diagnosis not present

## 2022-01-01 DIAGNOSIS — M48061 Spinal stenosis, lumbar region without neurogenic claudication: Secondary | ICD-10-CM | POA: Diagnosis not present

## 2022-01-04 ENCOUNTER — Telehealth: Payer: Self-pay | Admitting: Family Medicine

## 2022-01-04 DIAGNOSIS — M21372 Foot drop, left foot: Secondary | ICD-10-CM

## 2022-01-04 DIAGNOSIS — M479 Spondylosis, unspecified: Secondary | ICD-10-CM

## 2022-01-04 MED ORDER — PREDNISONE 10 MG PO TABS: ORAL_TABLET | ORAL | 0 refills | Status: AC

## 2022-01-04 NOTE — Telephone Encounter (Signed)
I sent in a prednisone taper for him to try to see if that will help with his pain. I placed the neurosurgery referral.

## 2022-01-04 NOTE — Telephone Encounter (Signed)
Please look at the result note for his MRI.  Please call them and see what kind of pain he is having.  Thanks.

## 2022-01-04 NOTE — Telephone Encounter (Signed)
I called and informed the wife of the patient that the provider sent in medication for the patient and he put in a referral for the back specialist and she understood. Jadian Karman,cma

## 2022-01-04 NOTE — Telephone Encounter (Signed)
I called the patient's wife and informed her  of his MRI results and she understood, she stated that his pain is in his lower back and his knee but he has a appointment to get a injection in his knee on february 22, he is mainly complaining of his lower back pain.

## 2022-01-04 NOTE — Telephone Encounter (Signed)
Pt spouse is calling about MRI results. Pt is in a lot of pain and need medication

## 2022-01-05 ENCOUNTER — Other Ambulatory Visit: Payer: Self-pay | Admitting: Family Medicine

## 2022-01-08 ENCOUNTER — Telehealth: Payer: Self-pay | Admitting: Family Medicine

## 2022-01-08 NOTE — Telephone Encounter (Signed)
I called and spoke with the patients daughter and she had questions about the mri and the referral that was placed.  She stated the prednisone was helping the patient a lot.  Wali Reinheimer,cma

## 2022-01-08 NOTE — Telephone Encounter (Signed)
Patient's daughter Louanna Raw called, she is on the [facility] form. She has questions about patient's MRI and would like a call back.

## 2022-01-09 DIAGNOSIS — M21372 Foot drop, left foot: Secondary | ICD-10-CM | POA: Diagnosis not present

## 2022-01-09 DIAGNOSIS — R258 Other abnormal involuntary movements: Secondary | ICD-10-CM | POA: Diagnosis not present

## 2022-01-09 DIAGNOSIS — M5136 Other intervertebral disc degeneration, lumbar region: Secondary | ICD-10-CM | POA: Diagnosis not present

## 2022-01-09 DIAGNOSIS — M47816 Spondylosis without myelopathy or radiculopathy, lumbar region: Secondary | ICD-10-CM | POA: Diagnosis not present

## 2022-01-09 DIAGNOSIS — M4316 Spondylolisthesis, lumbar region: Secondary | ICD-10-CM | POA: Diagnosis not present

## 2022-01-18 ENCOUNTER — Other Ambulatory Visit: Payer: Self-pay | Admitting: Orthopedic Surgery

## 2022-01-18 DIAGNOSIS — M21372 Foot drop, left foot: Secondary | ICD-10-CM

## 2022-01-18 DIAGNOSIS — R258 Other abnormal involuntary movements: Secondary | ICD-10-CM

## 2022-01-18 DIAGNOSIS — M4316 Spondylolisthesis, lumbar region: Secondary | ICD-10-CM

## 2022-01-20 ENCOUNTER — Ambulatory Visit
Admission: RE | Admit: 2022-01-20 | Discharge: 2022-01-20 | Disposition: A | Discharge status: Home / Self Care | Payer: Medicare HMO | Source: Ambulatory Visit | Attending: Orthopedic Surgery | Admitting: Orthopedic Surgery

## 2022-01-20 DIAGNOSIS — M4316 Spondylolisthesis, lumbar region: Secondary | ICD-10-CM | POA: Diagnosis not present

## 2022-01-20 DIAGNOSIS — M21372 Foot drop, left foot: Secondary | ICD-10-CM

## 2022-01-20 DIAGNOSIS — R258 Other abnormal involuntary movements: Secondary | ICD-10-CM

## 2022-01-20 DIAGNOSIS — M40204 Unspecified kyphosis, thoracic region: Secondary | ICD-10-CM | POA: Diagnosis not present

## 2022-01-20 DIAGNOSIS — M2578 Osteophyte, vertebrae: Secondary | ICD-10-CM | POA: Diagnosis not present

## 2022-01-20 DIAGNOSIS — M4802 Spinal stenosis, cervical region: Secondary | ICD-10-CM | POA: Diagnosis not present

## 2022-01-20 IMAGING — MR MR CERVICAL SPINE W/O CM
5 series · 38 of 48 positions shown · non-contrast
Comparison: CT [DATE]

CLINICAL DATA: Frequent falls

EXAM:
MRI CERVICAL SPINE WITHOUT CONTRAST
TECHNIQUE: Multiplanar, multisequence MR imaging of the cervical spine was
performed. No intravenous contrast was administered.

[Series 16: T2 · sagittal · 3.0mm · 0.56mm/px · 8 of 15 slices shown (1 of 2)]
[im 1/15]
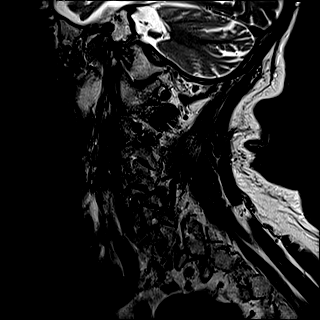
[im 3/15]
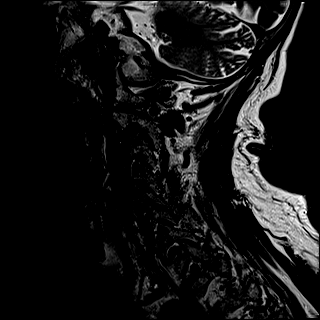
[im 5/15]
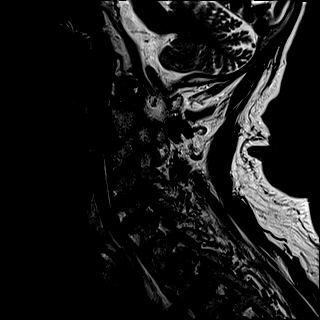
[im 7/15]
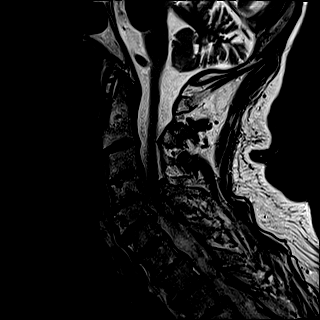
[im 9/15]
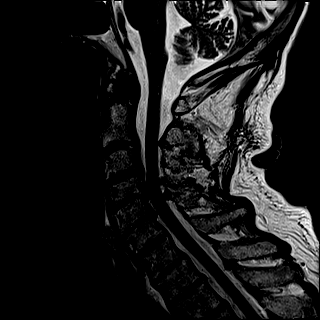
[im 11/15]
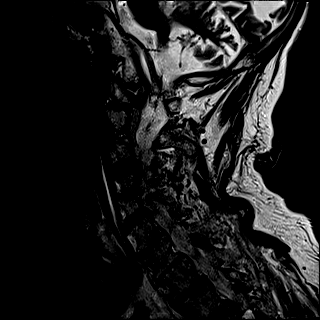
[im 13/15]
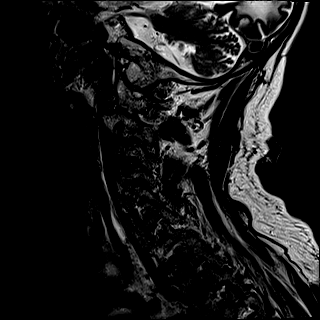
[im 15/15]
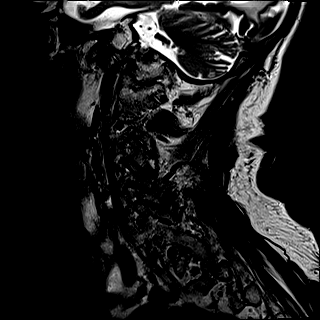

[Series 17: FLAIR · sagittal · 3.0mm · 0.70mm/px · 7 of 15 slices shown]
[im 1/15]
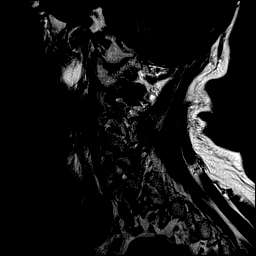
[im 3/15]
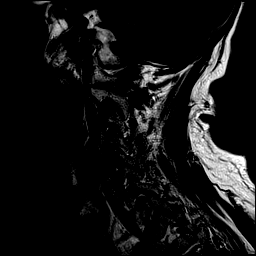
[im 5/15]
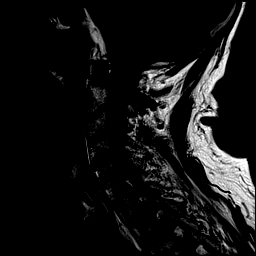
[im 8/15]
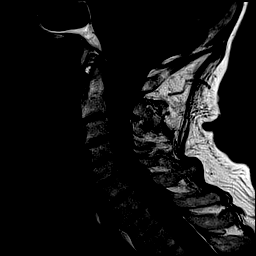
[im 10/15]
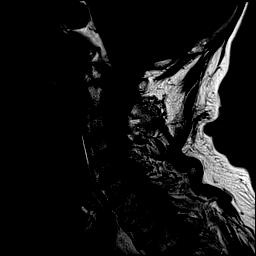
[im 12/15]
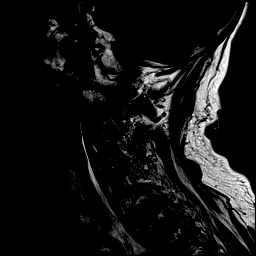
[im 15/15]
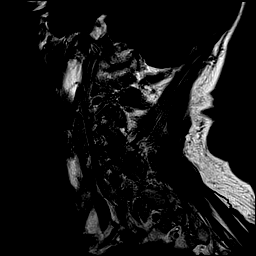

[Series 18: STIR · sagittal · 3.0mm · 0.62mm/px · 7 of 15 slices shown]
[im 1/15]
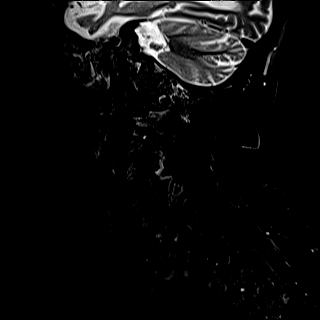
[im 3/15]
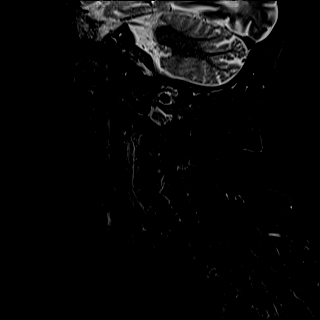
[im 5/15]
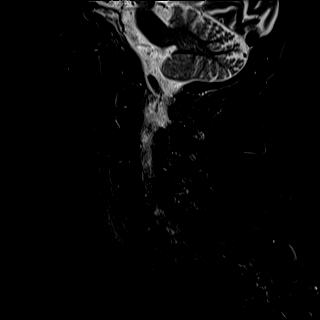
[im 8/15]
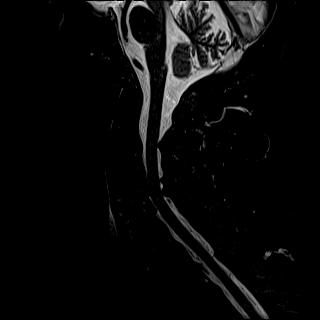
[im 10/15]
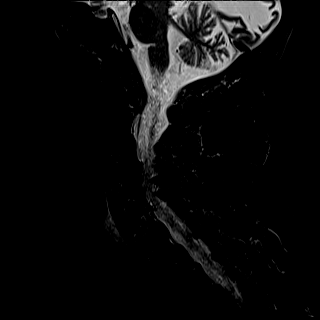
[im 12/15]
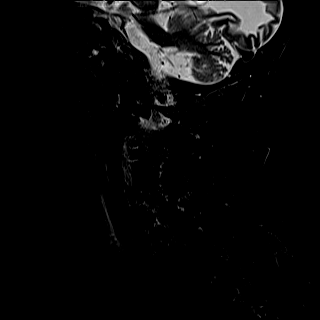
[im 15/15]
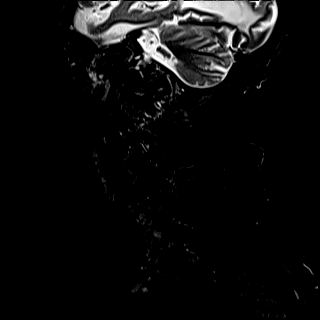

[Series 19: T2 · axial · 3.0mm · 0.70mm/px · z∈[-142,-63]mm · 9 of 27 slices shown (2 of 2)]
[im 1/27]
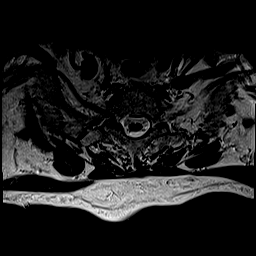
[im 5/27]
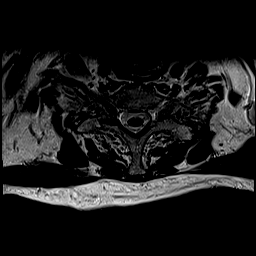
[im 9/27]
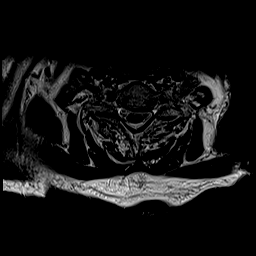
[im 11/27]
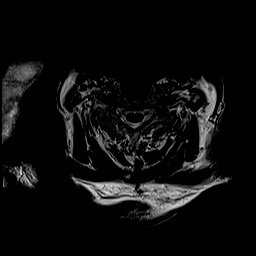
[im 14/27]
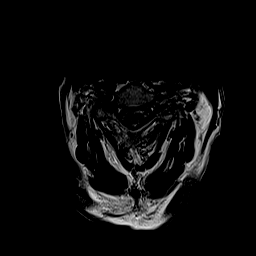
[im 16/27]
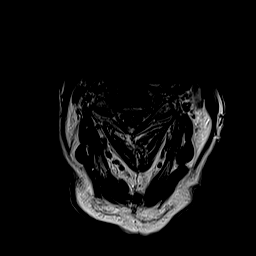
[im 18/27]
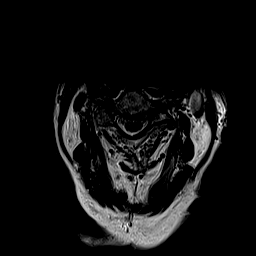
[im 22/27]
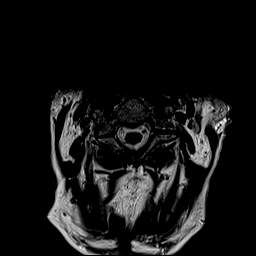
[im 27/27]
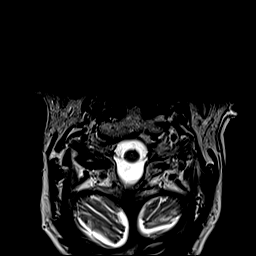

[Series 20: csp ax mpgr · axial · 3.0mm · 0.35mm/px · z∈[-142,-78]mm · 7 of 27 slices shown]
[im 1/27]
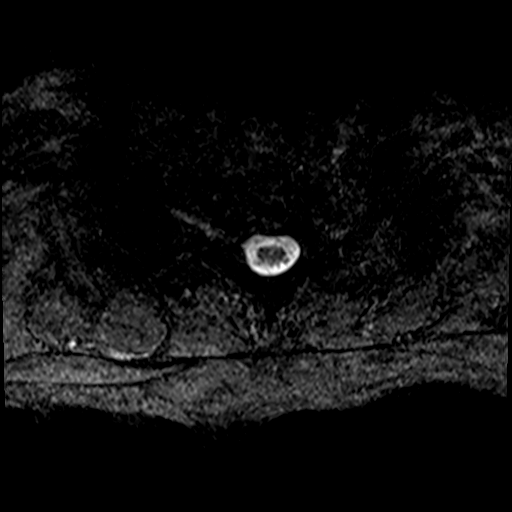
[im 5/27]
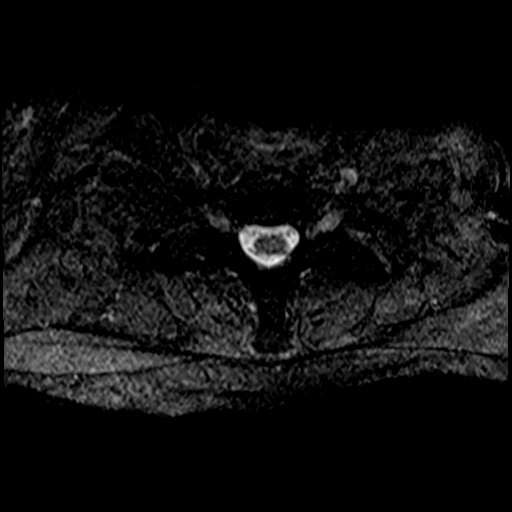
[im 9/27]
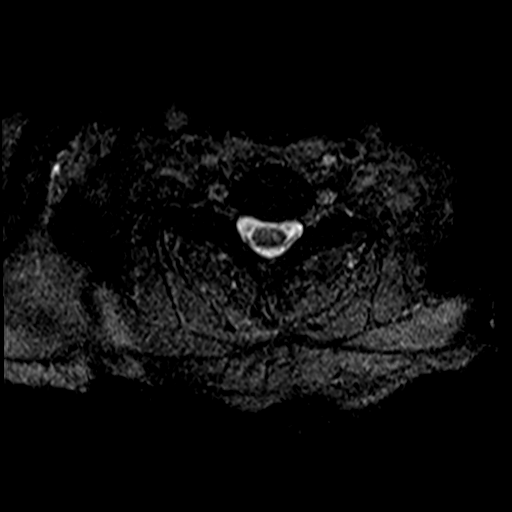
[im 11/27]
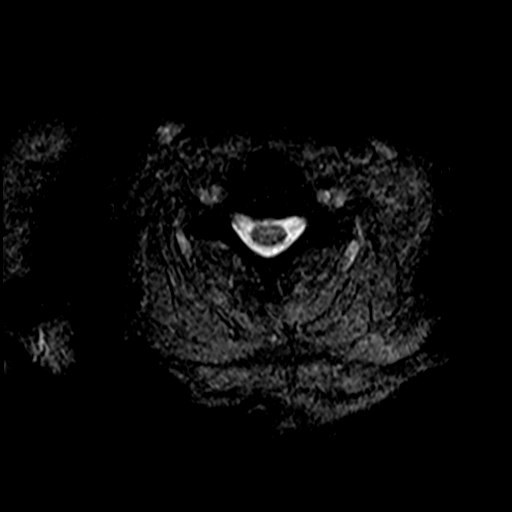
[im 16/27]
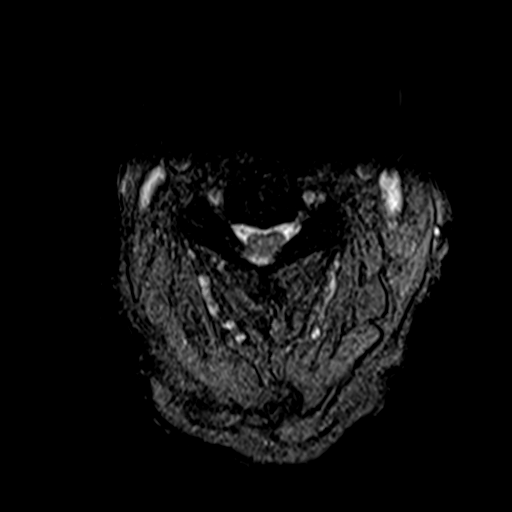
[im 18/27]
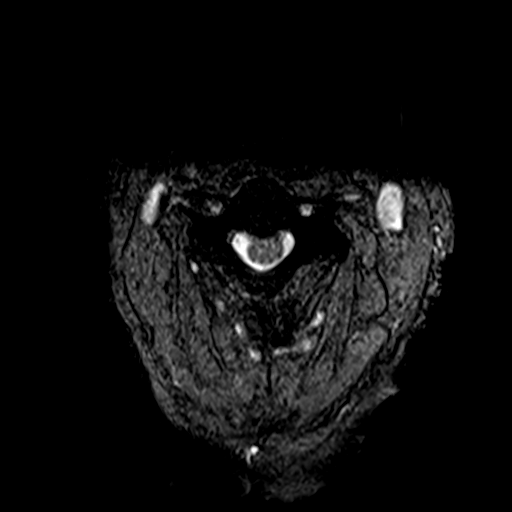
[im 22/27]
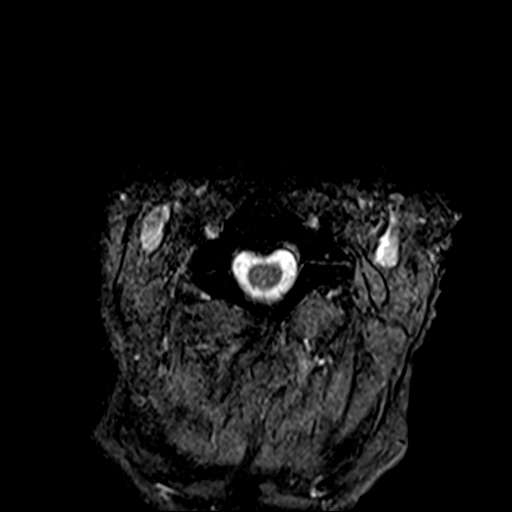

[38 of 48 positions shown; findings below may reference images not displayed]

FINDINGS: Alignment: Trace retrolisthesis of C4 on C5.

Vertebrae: No fracture, evidence of discitis, or bone lesion.

Cord: Normal signal and morphology.

Posterior Fossa, vertebral arteries, paraspinal tissues: Generalized
cerebral volume loss with partially visualized left occipital lobe
encephalomalacia, as seen on prior CT. No paraspinal soft tissue
abnormality identified.

Disc levels:

C2-C3: Minimal disc osteophyte complex with mild bilateral facet
arthropathy. Mild right-sided foraminal stenosis. No canal stenosis.

C3-C4: Mild disc osteophyte complex with left greater than right
facet and uncovertebral arthropathy. There is mild-moderate left
foraminal stenosis. No canal stenosis.

C4-C5: Disc osteophyte complex with right greater than left
uncovertebral arthropathy and minimal facet joint arthropathy.
Findings result in moderate-severe right foraminal stenosis with
moderate canal stenosis.

C5-C6: Disc osteophyte complex with minimal facet and uncovertebral
arthropathy. Findings result in mild canal stenosis with mild
bilateral foraminal stenosis.

C6-C7: Minimal disc osteophyte complex. Facet joints within normal
limits. No foraminal or canal stenosis.

C7-T1: Disc height loss without intrusion. Facet joints within
normal limits. No foraminal or canal stenosis.
IMPRESSION: 1. Multilevel degenerative changes of the cervical spine, most
pronounced at the C4-5 level where there is moderate-to-severe right
foraminal stenosis with moderate canal stenosis.
2. Mild canal stenosis and mild bilateral foraminal stenosis at
C5-6.

## 2022-01-20 IMAGING — MR MR THORACIC SPINE W/O CM
5 of 6 series · 31 of 48 positions shown · non-contrast
Comparison: CT of the chest [DATE].

CLINICAL DATA: Left foot drop [BP] ([BP]-CM)Clonus [BP]
([BP]-CM)Spondylolisthesis of lumbar region [BP] ([BP]-CM)

EXAM:
MRI THORACIC SPINE WITHOUT CONTRAST
TECHNIQUE: Multiplanar, multisequence MR imaging of the thoracic spine was
performed. No intravenous contrast was administered.

[Series 18: T1 · sagittal · 6.0mm · 1.88mm/px · 3 of 9 slices shown (1 of 2)]
[im 1/9]
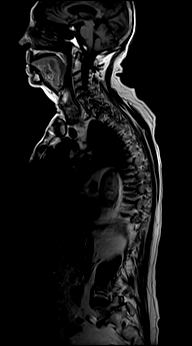
[im 5/9]
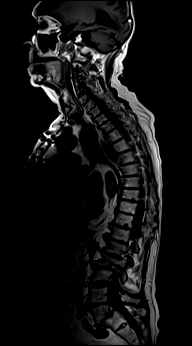
[im 9/9]
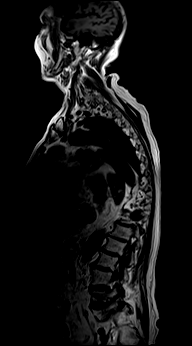

[Series 19: T2 · sagittal · 3.0mm · 1.00mm/px · 7 of 23 slices shown (1 of 2)]
[im 1/23]
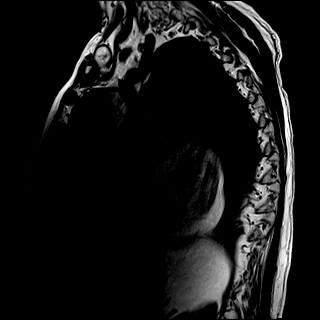
[im 4/23]
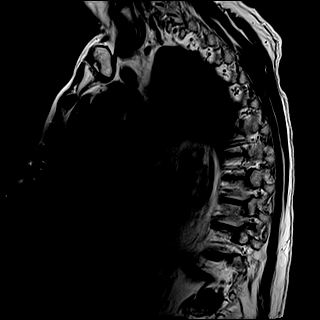
[im 8/23]
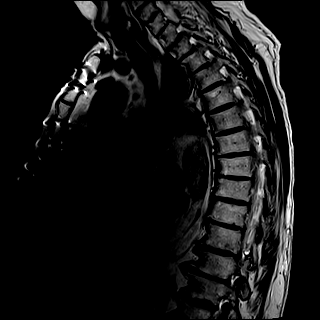
[im 12/23]
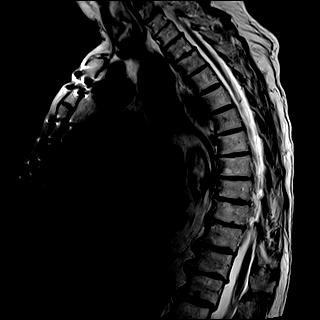
[im 15/23]
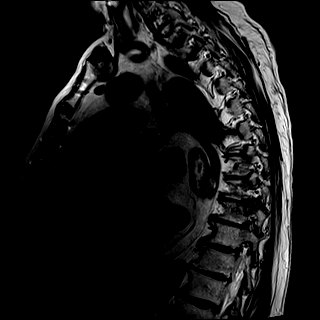
[im 19/23]
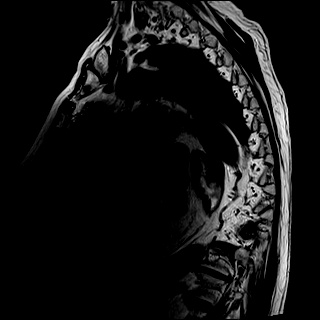
[im 23/23]
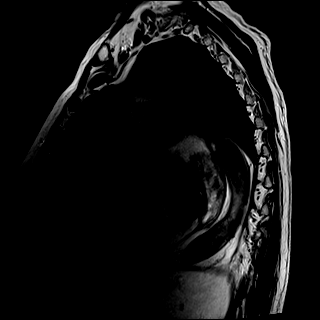

[Series 20: T1 · sagittal · 3.0mm · 1.00mm/px · 7 of 23 slices shown (2 of 2)]
[im 1/23]
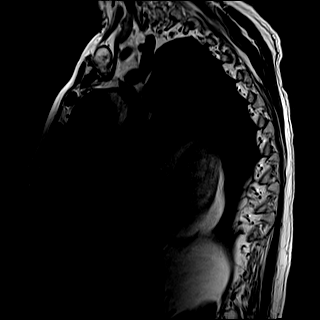
[im 4/23]
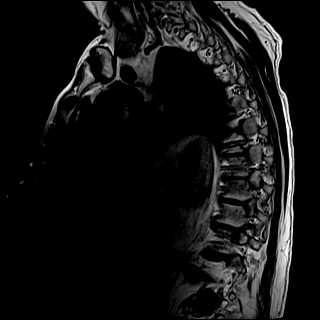
[im 8/23]
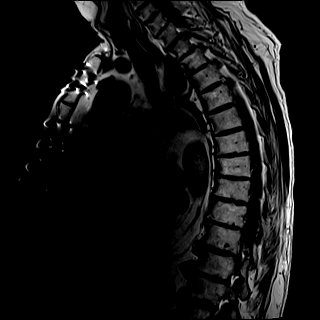
[im 12/23]
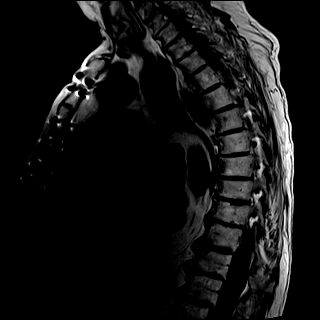
[im 15/23]
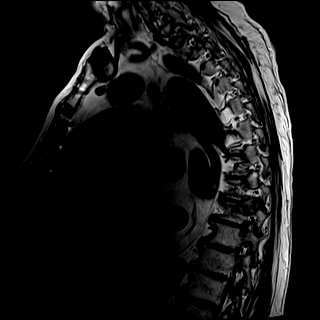
[im 19/23]
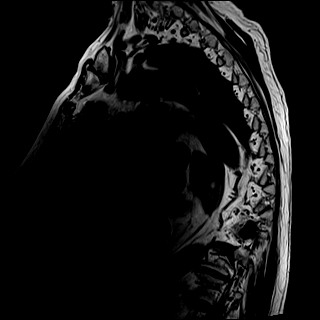
[im 23/23]
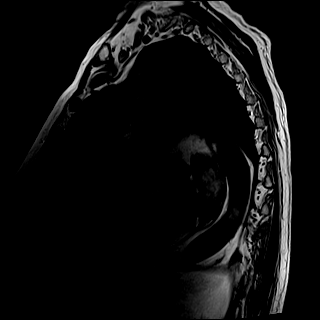

[Series 21: STIR · sagittal · 3.0mm · 0.50mm/px · 5 of 23 slices shown]
[im 1/23]
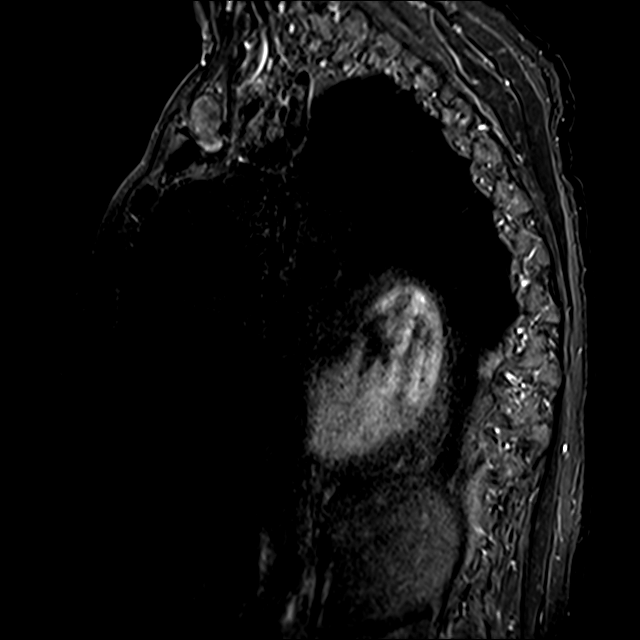
[im 4/23]
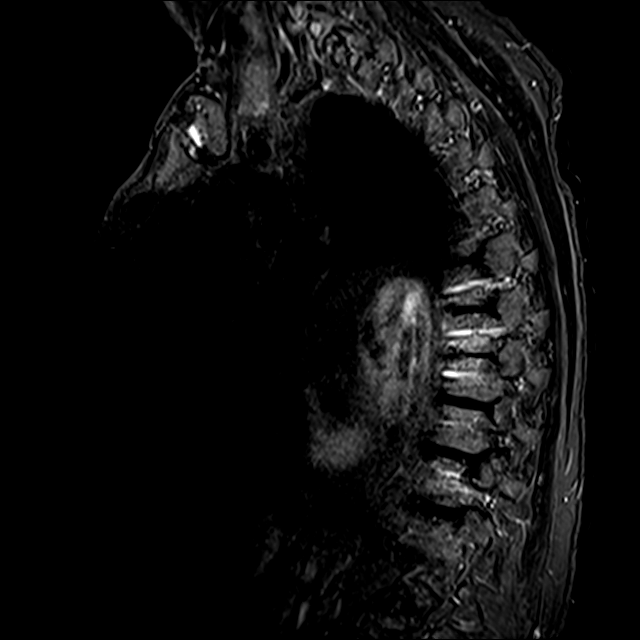
[im 8/23]
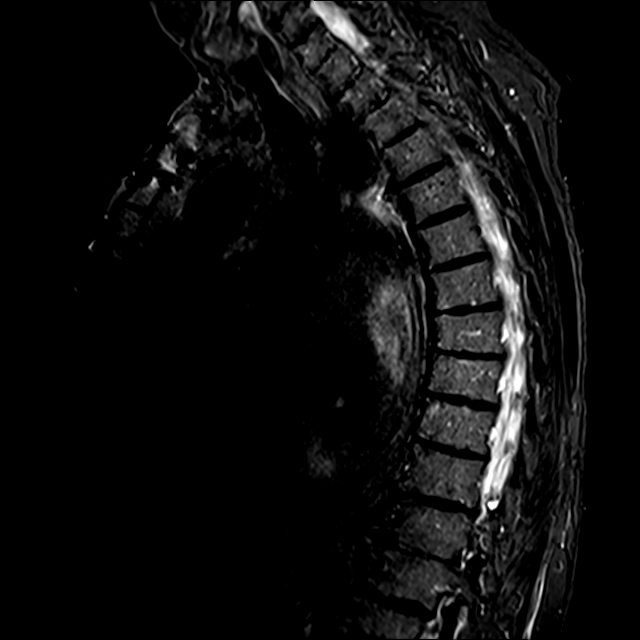
[im 12/23]
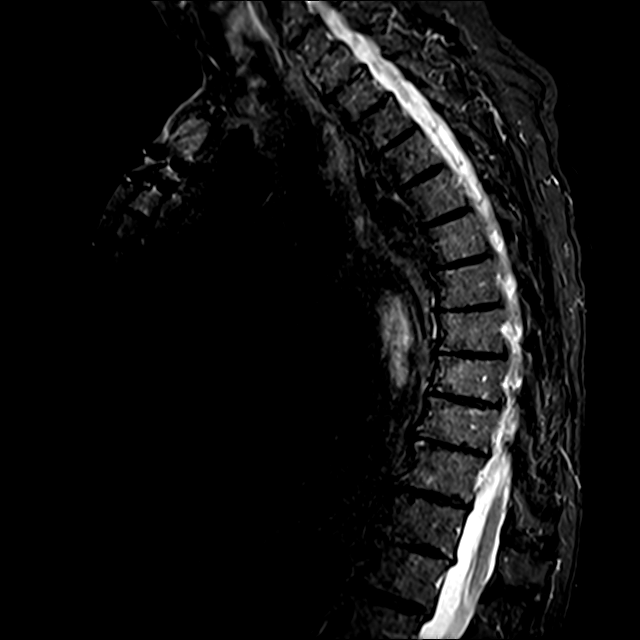
[im 15/23]
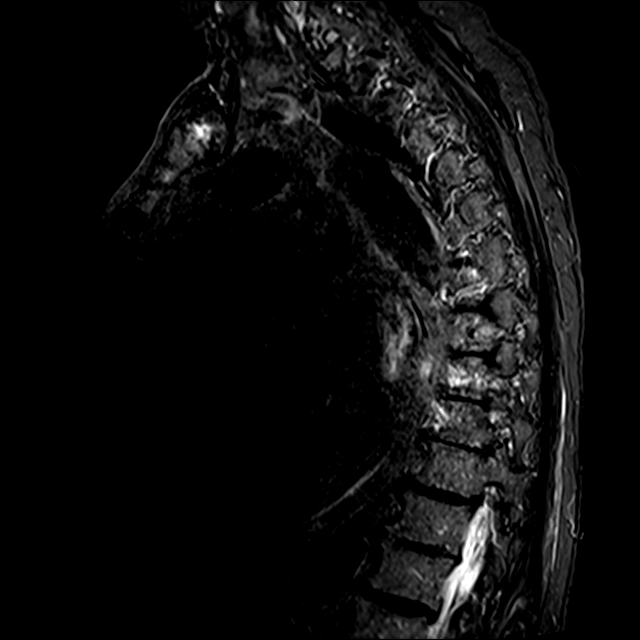

[Series 22: T2 · axial · 4.0mm · 0.59mm/px · z∈[-335,-153]mm · 9 of 39 slices shown (2 of 2)]
[im 1/39]
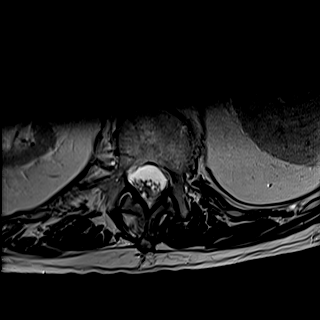
[im 7/39]
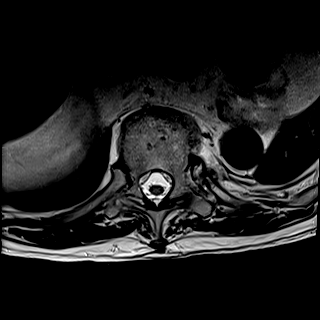
[im 11/39]
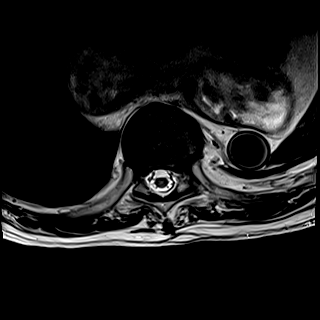
[im 18/39]
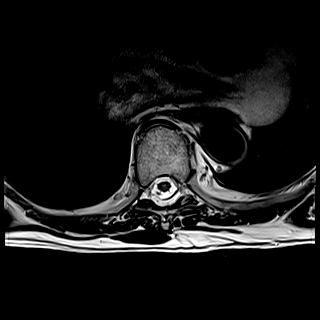
[im 21/39]
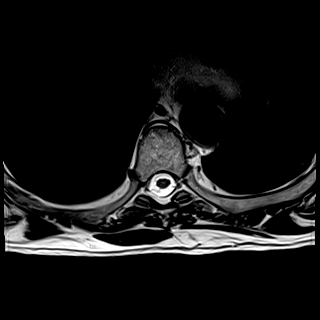
[im 28/39]
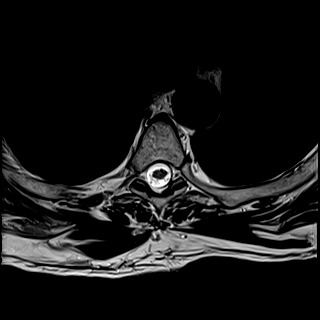
[im 32/39]
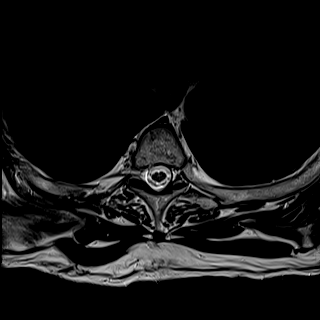
[im 35/39]
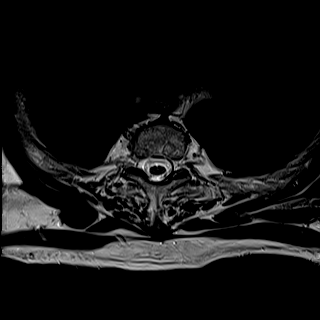
[im 39/39]
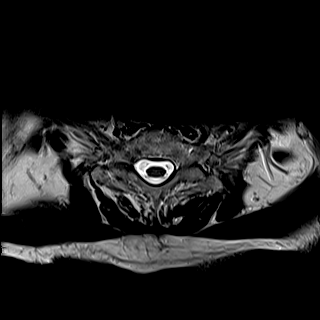

[31 of 48 positions shown; findings below may reference images not displayed]

FINDINGS: Alignment: Mildly exaggerated thoracic kyphosis. No substantial
sagittal subluxation.

Vertebrae: Recent/unhealed fracture of the posterior left eleventh
rib near the costovertebral junction with associated marrow and
surrounding paraspinal soft tissue edema. Marrow edema does not
appear to involve the vertebral body. Vertebral body heights are
maintained without clear evidence of vertebral body fracture. No
suspicious bone lesions.

Cord:  Normal cord signal.

Paraspinal and other soft tissues: Large hiatal hernia.

Disc levels:

Mild posterior disc bulging/endplate spurring at multiple levels
without significant canal stenosis. Multilevel facet arthropathy.
Moderate to severe left foraminal stenosis at T10-T11. Moderate
foraminal stenosis bilaterally at T11-T12 and on the right at T12-L1
and L1-L2. Otherwise, multilevel mild foraminal stenosis.
IMPRESSION: 1. Recent/unhealed fracture of the posterior left eleventh rib near
the costovertebral junction with associated marrow and surrounding
paraspinal soft tissue edema. This fracture is new since CT of the
chest from [DATE]. No clear evidence of vertebral body fracture.
2. Moderate to severe left foraminal stenosis at T10-T11. Moderate
foraminal stenosis bilaterally at T11-T12 and on the right at T12-L1
and L1-L2. Otherwise, multilevel mild foraminal stenosis. No
significant canal stenosis.
3. Large hiatal hernia.

## 2022-02-05 ENCOUNTER — Other Ambulatory Visit: Payer: Self-pay | Admitting: Family Medicine

## 2022-02-05 DIAGNOSIS — M21372 Foot drop, left foot: Secondary | ICD-10-CM | POA: Diagnosis not present

## 2022-02-05 DIAGNOSIS — M5136 Other intervertebral disc degeneration, lumbar region: Secondary | ICD-10-CM | POA: Diagnosis not present

## 2022-02-05 DIAGNOSIS — M5416 Radiculopathy, lumbar region: Secondary | ICD-10-CM | POA: Diagnosis not present

## 2022-02-09 DIAGNOSIS — I4892 Unspecified atrial flutter: Secondary | ICD-10-CM | POA: Diagnosis not present

## 2022-02-09 DIAGNOSIS — E7800 Pure hypercholesterolemia, unspecified: Secondary | ICD-10-CM | POA: Diagnosis not present

## 2022-02-09 DIAGNOSIS — H53461 Homonymous bilateral field defects, right side: Secondary | ICD-10-CM | POA: Diagnosis not present

## 2022-02-09 DIAGNOSIS — J449 Chronic obstructive pulmonary disease, unspecified: Secondary | ICD-10-CM | POA: Diagnosis not present

## 2022-02-09 DIAGNOSIS — E119 Type 2 diabetes mellitus without complications: Secondary | ICD-10-CM | POA: Diagnosis not present

## 2022-02-09 DIAGNOSIS — I252 Old myocardial infarction: Secondary | ICD-10-CM | POA: Diagnosis not present

## 2022-02-09 DIAGNOSIS — Z7951 Long term (current) use of inhaled steroids: Secondary | ICD-10-CM | POA: Diagnosis not present

## 2022-02-09 DIAGNOSIS — M5136 Other intervertebral disc degeneration, lumbar region: Secondary | ICD-10-CM | POA: Diagnosis not present

## 2022-02-09 DIAGNOSIS — I4891 Unspecified atrial fibrillation: Secondary | ICD-10-CM | POA: Diagnosis not present

## 2022-02-09 DIAGNOSIS — M21372 Foot drop, left foot: Secondary | ICD-10-CM | POA: Diagnosis not present

## 2022-02-09 DIAGNOSIS — M5116 Intervertebral disc disorders with radiculopathy, lumbar region: Secondary | ICD-10-CM | POA: Diagnosis not present

## 2022-02-09 DIAGNOSIS — Z87891 Personal history of nicotine dependence: Secondary | ICD-10-CM | POA: Diagnosis not present

## 2022-02-09 DIAGNOSIS — I69398 Other sequelae of cerebral infarction: Secondary | ICD-10-CM | POA: Diagnosis not present

## 2022-02-09 DIAGNOSIS — Z9181 History of falling: Secondary | ICD-10-CM | POA: Diagnosis not present

## 2022-02-09 DIAGNOSIS — Z7901 Long term (current) use of anticoagulants: Secondary | ICD-10-CM | POA: Diagnosis not present

## 2022-02-09 DIAGNOSIS — I2510 Atherosclerotic heart disease of native coronary artery without angina pectoris: Secondary | ICD-10-CM | POA: Diagnosis not present

## 2022-02-09 DIAGNOSIS — K5900 Constipation, unspecified: Secondary | ICD-10-CM | POA: Diagnosis not present

## 2022-02-09 DIAGNOSIS — Z951 Presence of aortocoronary bypass graft: Secondary | ICD-10-CM | POA: Diagnosis not present

## 2022-02-09 DIAGNOSIS — Z7984 Long term (current) use of oral hypoglycemic drugs: Secondary | ICD-10-CM | POA: Diagnosis not present

## 2022-02-12 DIAGNOSIS — I2510 Atherosclerotic heart disease of native coronary artery without angina pectoris: Secondary | ICD-10-CM | POA: Diagnosis not present

## 2022-02-12 DIAGNOSIS — E7800 Pure hypercholesterolemia, unspecified: Secondary | ICD-10-CM | POA: Diagnosis not present

## 2022-02-12 DIAGNOSIS — I69398 Other sequelae of cerebral infarction: Secondary | ICD-10-CM | POA: Diagnosis not present

## 2022-02-12 DIAGNOSIS — M21372 Foot drop, left foot: Secondary | ICD-10-CM | POA: Diagnosis not present

## 2022-02-12 DIAGNOSIS — M5116 Intervertebral disc disorders with radiculopathy, lumbar region: Secondary | ICD-10-CM | POA: Diagnosis not present

## 2022-02-12 DIAGNOSIS — I4892 Unspecified atrial flutter: Secondary | ICD-10-CM | POA: Diagnosis not present

## 2022-02-12 DIAGNOSIS — E119 Type 2 diabetes mellitus without complications: Secondary | ICD-10-CM | POA: Diagnosis not present

## 2022-02-12 DIAGNOSIS — J449 Chronic obstructive pulmonary disease, unspecified: Secondary | ICD-10-CM | POA: Diagnosis not present

## 2022-02-12 DIAGNOSIS — H53461 Homonymous bilateral field defects, right side: Secondary | ICD-10-CM | POA: Diagnosis not present

## 2022-02-12 DIAGNOSIS — I4891 Unspecified atrial fibrillation: Secondary | ICD-10-CM | POA: Diagnosis not present

## 2022-02-13 DIAGNOSIS — G5732 Lesion of lateral popliteal nerve, left lower limb: Secondary | ICD-10-CM | POA: Diagnosis not present

## 2022-02-14 DIAGNOSIS — I4892 Unspecified atrial flutter: Secondary | ICD-10-CM | POA: Diagnosis not present

## 2022-02-14 DIAGNOSIS — M21372 Foot drop, left foot: Secondary | ICD-10-CM | POA: Diagnosis not present

## 2022-02-14 DIAGNOSIS — J449 Chronic obstructive pulmonary disease, unspecified: Secondary | ICD-10-CM | POA: Diagnosis not present

## 2022-02-14 DIAGNOSIS — H53461 Homonymous bilateral field defects, right side: Secondary | ICD-10-CM | POA: Diagnosis not present

## 2022-02-14 DIAGNOSIS — I69398 Other sequelae of cerebral infarction: Secondary | ICD-10-CM | POA: Diagnosis not present

## 2022-02-14 DIAGNOSIS — I4891 Unspecified atrial fibrillation: Secondary | ICD-10-CM | POA: Diagnosis not present

## 2022-02-14 DIAGNOSIS — E119 Type 2 diabetes mellitus without complications: Secondary | ICD-10-CM | POA: Diagnosis not present

## 2022-02-14 DIAGNOSIS — M5116 Intervertebral disc disorders with radiculopathy, lumbar region: Secondary | ICD-10-CM | POA: Diagnosis not present

## 2022-02-14 DIAGNOSIS — I2510 Atherosclerotic heart disease of native coronary artery without angina pectoris: Secondary | ICD-10-CM | POA: Diagnosis not present

## 2022-02-14 DIAGNOSIS — E7800 Pure hypercholesterolemia, unspecified: Secondary | ICD-10-CM | POA: Diagnosis not present

## 2022-02-20 DIAGNOSIS — M21372 Foot drop, left foot: Secondary | ICD-10-CM | POA: Diagnosis not present

## 2022-02-20 DIAGNOSIS — J449 Chronic obstructive pulmonary disease, unspecified: Secondary | ICD-10-CM | POA: Diagnosis not present

## 2022-02-20 DIAGNOSIS — H53461 Homonymous bilateral field defects, right side: Secondary | ICD-10-CM | POA: Diagnosis not present

## 2022-02-20 DIAGNOSIS — I69398 Other sequelae of cerebral infarction: Secondary | ICD-10-CM | POA: Diagnosis not present

## 2022-02-20 DIAGNOSIS — E119 Type 2 diabetes mellitus without complications: Secondary | ICD-10-CM | POA: Diagnosis not present

## 2022-02-20 DIAGNOSIS — I2510 Atherosclerotic heart disease of native coronary artery without angina pectoris: Secondary | ICD-10-CM | POA: Diagnosis not present

## 2022-02-20 DIAGNOSIS — I4891 Unspecified atrial fibrillation: Secondary | ICD-10-CM | POA: Diagnosis not present

## 2022-02-20 DIAGNOSIS — I4892 Unspecified atrial flutter: Secondary | ICD-10-CM | POA: Diagnosis not present

## 2022-02-20 DIAGNOSIS — M5116 Intervertebral disc disorders with radiculopathy, lumbar region: Secondary | ICD-10-CM | POA: Diagnosis not present

## 2022-02-20 DIAGNOSIS — E7800 Pure hypercholesterolemia, unspecified: Secondary | ICD-10-CM | POA: Diagnosis not present

## 2022-02-22 DIAGNOSIS — M21372 Foot drop, left foot: Secondary | ICD-10-CM | POA: Diagnosis not present

## 2022-02-22 DIAGNOSIS — I2510 Atherosclerotic heart disease of native coronary artery without angina pectoris: Secondary | ICD-10-CM | POA: Diagnosis not present

## 2022-02-22 DIAGNOSIS — I4892 Unspecified atrial flutter: Secondary | ICD-10-CM | POA: Diagnosis not present

## 2022-02-22 DIAGNOSIS — E119 Type 2 diabetes mellitus without complications: Secondary | ICD-10-CM | POA: Diagnosis not present

## 2022-02-22 DIAGNOSIS — J449 Chronic obstructive pulmonary disease, unspecified: Secondary | ICD-10-CM | POA: Diagnosis not present

## 2022-02-22 DIAGNOSIS — M5116 Intervertebral disc disorders with radiculopathy, lumbar region: Secondary | ICD-10-CM | POA: Diagnosis not present

## 2022-02-22 DIAGNOSIS — I4891 Unspecified atrial fibrillation: Secondary | ICD-10-CM | POA: Diagnosis not present

## 2022-02-22 DIAGNOSIS — I69398 Other sequelae of cerebral infarction: Secondary | ICD-10-CM | POA: Diagnosis not present

## 2022-02-22 DIAGNOSIS — E7800 Pure hypercholesterolemia, unspecified: Secondary | ICD-10-CM | POA: Diagnosis not present

## 2022-02-22 DIAGNOSIS — H53461 Homonymous bilateral field defects, right side: Secondary | ICD-10-CM | POA: Diagnosis not present

## 2022-02-26 DIAGNOSIS — E7800 Pure hypercholesterolemia, unspecified: Secondary | ICD-10-CM | POA: Diagnosis not present

## 2022-02-26 DIAGNOSIS — E119 Type 2 diabetes mellitus without complications: Secondary | ICD-10-CM | POA: Diagnosis not present

## 2022-02-26 DIAGNOSIS — I2510 Atherosclerotic heart disease of native coronary artery without angina pectoris: Secondary | ICD-10-CM | POA: Diagnosis not present

## 2022-02-26 DIAGNOSIS — M21372 Foot drop, left foot: Secondary | ICD-10-CM | POA: Diagnosis not present

## 2022-02-26 DIAGNOSIS — J449 Chronic obstructive pulmonary disease, unspecified: Secondary | ICD-10-CM | POA: Diagnosis not present

## 2022-02-26 DIAGNOSIS — I4892 Unspecified atrial flutter: Secondary | ICD-10-CM | POA: Diagnosis not present

## 2022-02-26 DIAGNOSIS — I4891 Unspecified atrial fibrillation: Secondary | ICD-10-CM | POA: Diagnosis not present

## 2022-02-26 DIAGNOSIS — I69398 Other sequelae of cerebral infarction: Secondary | ICD-10-CM | POA: Diagnosis not present

## 2022-02-26 DIAGNOSIS — M5116 Intervertebral disc disorders with radiculopathy, lumbar region: Secondary | ICD-10-CM | POA: Diagnosis not present

## 2022-02-26 DIAGNOSIS — H53461 Homonymous bilateral field defects, right side: Secondary | ICD-10-CM | POA: Diagnosis not present

## 2022-02-28 DIAGNOSIS — M21372 Foot drop, left foot: Secondary | ICD-10-CM | POA: Diagnosis not present

## 2022-02-28 DIAGNOSIS — J449 Chronic obstructive pulmonary disease, unspecified: Secondary | ICD-10-CM | POA: Diagnosis not present

## 2022-02-28 DIAGNOSIS — I69398 Other sequelae of cerebral infarction: Secondary | ICD-10-CM | POA: Diagnosis not present

## 2022-02-28 DIAGNOSIS — E7800 Pure hypercholesterolemia, unspecified: Secondary | ICD-10-CM | POA: Diagnosis not present

## 2022-02-28 DIAGNOSIS — I4892 Unspecified atrial flutter: Secondary | ICD-10-CM | POA: Diagnosis not present

## 2022-02-28 DIAGNOSIS — I4891 Unspecified atrial fibrillation: Secondary | ICD-10-CM | POA: Diagnosis not present

## 2022-02-28 DIAGNOSIS — I2510 Atherosclerotic heart disease of native coronary artery without angina pectoris: Secondary | ICD-10-CM | POA: Diagnosis not present

## 2022-02-28 DIAGNOSIS — M5116 Intervertebral disc disorders with radiculopathy, lumbar region: Secondary | ICD-10-CM | POA: Diagnosis not present

## 2022-02-28 DIAGNOSIS — H53461 Homonymous bilateral field defects, right side: Secondary | ICD-10-CM | POA: Diagnosis not present

## 2022-02-28 DIAGNOSIS — E119 Type 2 diabetes mellitus without complications: Secondary | ICD-10-CM | POA: Diagnosis not present

## 2022-03-05 DIAGNOSIS — E7800 Pure hypercholesterolemia, unspecified: Secondary | ICD-10-CM | POA: Diagnosis not present

## 2022-03-05 DIAGNOSIS — E119 Type 2 diabetes mellitus without complications: Secondary | ICD-10-CM | POA: Diagnosis not present

## 2022-03-05 DIAGNOSIS — H53461 Homonymous bilateral field defects, right side: Secondary | ICD-10-CM | POA: Diagnosis not present

## 2022-03-05 DIAGNOSIS — I4892 Unspecified atrial flutter: Secondary | ICD-10-CM | POA: Diagnosis not present

## 2022-03-05 DIAGNOSIS — M5116 Intervertebral disc disorders with radiculopathy, lumbar region: Secondary | ICD-10-CM | POA: Diagnosis not present

## 2022-03-05 DIAGNOSIS — I69398 Other sequelae of cerebral infarction: Secondary | ICD-10-CM | POA: Diagnosis not present

## 2022-03-05 DIAGNOSIS — M21372 Foot drop, left foot: Secondary | ICD-10-CM | POA: Diagnosis not present

## 2022-03-05 DIAGNOSIS — J449 Chronic obstructive pulmonary disease, unspecified: Secondary | ICD-10-CM | POA: Diagnosis not present

## 2022-03-05 DIAGNOSIS — I4891 Unspecified atrial fibrillation: Secondary | ICD-10-CM | POA: Diagnosis not present

## 2022-03-05 DIAGNOSIS — I2510 Atherosclerotic heart disease of native coronary artery without angina pectoris: Secondary | ICD-10-CM | POA: Diagnosis not present

## 2022-03-07 DIAGNOSIS — J449 Chronic obstructive pulmonary disease, unspecified: Secondary | ICD-10-CM | POA: Diagnosis not present

## 2022-03-07 DIAGNOSIS — M21372 Foot drop, left foot: Secondary | ICD-10-CM | POA: Diagnosis not present

## 2022-03-07 DIAGNOSIS — I2510 Atherosclerotic heart disease of native coronary artery without angina pectoris: Secondary | ICD-10-CM | POA: Diagnosis not present

## 2022-03-07 DIAGNOSIS — I4891 Unspecified atrial fibrillation: Secondary | ICD-10-CM | POA: Diagnosis not present

## 2022-03-07 DIAGNOSIS — E119 Type 2 diabetes mellitus without complications: Secondary | ICD-10-CM | POA: Diagnosis not present

## 2022-03-07 DIAGNOSIS — I69398 Other sequelae of cerebral infarction: Secondary | ICD-10-CM | POA: Diagnosis not present

## 2022-03-07 DIAGNOSIS — H53461 Homonymous bilateral field defects, right side: Secondary | ICD-10-CM | POA: Diagnosis not present

## 2022-03-07 DIAGNOSIS — I4892 Unspecified atrial flutter: Secondary | ICD-10-CM | POA: Diagnosis not present

## 2022-03-07 DIAGNOSIS — E7800 Pure hypercholesterolemia, unspecified: Secondary | ICD-10-CM | POA: Diagnosis not present

## 2022-03-07 DIAGNOSIS — M5116 Intervertebral disc disorders with radiculopathy, lumbar region: Secondary | ICD-10-CM | POA: Diagnosis not present

## 2022-03-11 ENCOUNTER — Other Ambulatory Visit: Payer: Self-pay | Admitting: Cardiovascular Disease

## 2022-03-11 ENCOUNTER — Other Ambulatory Visit: Payer: Self-pay | Admitting: Family Medicine

## 2022-03-11 NOTE — Telephone Encounter (Signed)
Please schedule 6 month F/U appointment for 90 day refills. Thank you! 

## 2022-03-12 ENCOUNTER — Other Ambulatory Visit: Payer: Self-pay

## 2022-03-12 DIAGNOSIS — I4892 Unspecified atrial flutter: Secondary | ICD-10-CM | POA: Diagnosis not present

## 2022-03-12 DIAGNOSIS — Z9181 History of falling: Secondary | ICD-10-CM | POA: Diagnosis not present

## 2022-03-12 DIAGNOSIS — Z87891 Personal history of nicotine dependence: Secondary | ICD-10-CM | POA: Diagnosis not present

## 2022-03-12 DIAGNOSIS — I4891 Unspecified atrial fibrillation: Secondary | ICD-10-CM | POA: Diagnosis not present

## 2022-03-12 DIAGNOSIS — M21372 Foot drop, left foot: Secondary | ICD-10-CM | POA: Diagnosis not present

## 2022-03-12 DIAGNOSIS — Z951 Presence of aortocoronary bypass graft: Secondary | ICD-10-CM | POA: Diagnosis not present

## 2022-03-12 DIAGNOSIS — K5900 Constipation, unspecified: Secondary | ICD-10-CM | POA: Diagnosis not present

## 2022-03-12 DIAGNOSIS — M5116 Intervertebral disc disorders with radiculopathy, lumbar region: Secondary | ICD-10-CM | POA: Diagnosis not present

## 2022-03-12 DIAGNOSIS — Z7984 Long term (current) use of oral hypoglycemic drugs: Secondary | ICD-10-CM | POA: Diagnosis not present

## 2022-03-12 DIAGNOSIS — Z7951 Long term (current) use of inhaled steroids: Secondary | ICD-10-CM | POA: Diagnosis not present

## 2022-03-12 DIAGNOSIS — I69398 Other sequelae of cerebral infarction: Secondary | ICD-10-CM | POA: Diagnosis not present

## 2022-03-12 DIAGNOSIS — Z7901 Long term (current) use of anticoagulants: Secondary | ICD-10-CM | POA: Diagnosis not present

## 2022-03-12 DIAGNOSIS — E7800 Pure hypercholesterolemia, unspecified: Secondary | ICD-10-CM | POA: Diagnosis not present

## 2022-03-12 DIAGNOSIS — I2510 Atherosclerotic heart disease of native coronary artery without angina pectoris: Secondary | ICD-10-CM | POA: Diagnosis not present

## 2022-03-12 DIAGNOSIS — J449 Chronic obstructive pulmonary disease, unspecified: Secondary | ICD-10-CM | POA: Diagnosis not present

## 2022-03-12 DIAGNOSIS — I252 Old myocardial infarction: Secondary | ICD-10-CM | POA: Diagnosis not present

## 2022-03-12 DIAGNOSIS — H53461 Homonymous bilateral field defects, right side: Secondary | ICD-10-CM | POA: Diagnosis not present

## 2022-03-12 DIAGNOSIS — E119 Type 2 diabetes mellitus without complications: Secondary | ICD-10-CM | POA: Diagnosis not present

## 2022-03-13 MED ORDER — LINAGLIPTIN 5 MG PO TABS: 5.0000 mg | ORAL_TABLET | Freq: Every day | ORAL | 1 refills | Status: DC

## 2022-03-19 ENCOUNTER — Ambulatory Visit: Payer: Medicare HMO | Admitting: Family Medicine

## 2022-03-22 DIAGNOSIS — M21372 Foot drop, left foot: Secondary | ICD-10-CM | POA: Diagnosis not present

## 2022-03-22 DIAGNOSIS — Z7951 Long term (current) use of inhaled steroids: Secondary | ICD-10-CM | POA: Diagnosis not present

## 2022-03-22 DIAGNOSIS — I4891 Unspecified atrial fibrillation: Secondary | ICD-10-CM | POA: Diagnosis not present

## 2022-03-22 DIAGNOSIS — E119 Type 2 diabetes mellitus without complications: Secondary | ICD-10-CM | POA: Diagnosis not present

## 2022-03-22 DIAGNOSIS — J449 Chronic obstructive pulmonary disease, unspecified: Secondary | ICD-10-CM | POA: Diagnosis not present

## 2022-03-22 DIAGNOSIS — H53461 Homonymous bilateral field defects, right side: Secondary | ICD-10-CM | POA: Diagnosis not present

## 2022-03-22 DIAGNOSIS — I69398 Other sequelae of cerebral infarction: Secondary | ICD-10-CM | POA: Diagnosis not present

## 2022-03-22 DIAGNOSIS — K5900 Constipation, unspecified: Secondary | ICD-10-CM | POA: Diagnosis not present

## 2022-03-22 DIAGNOSIS — I252 Old myocardial infarction: Secondary | ICD-10-CM | POA: Diagnosis not present

## 2022-03-22 DIAGNOSIS — I4892 Unspecified atrial flutter: Secondary | ICD-10-CM | POA: Diagnosis not present

## 2022-03-22 DIAGNOSIS — Z87891 Personal history of nicotine dependence: Secondary | ICD-10-CM | POA: Diagnosis not present

## 2022-03-22 DIAGNOSIS — M5116 Intervertebral disc disorders with radiculopathy, lumbar region: Secondary | ICD-10-CM | POA: Diagnosis not present

## 2022-03-22 DIAGNOSIS — E7800 Pure hypercholesterolemia, unspecified: Secondary | ICD-10-CM | POA: Diagnosis not present

## 2022-03-22 DIAGNOSIS — Z7901 Long term (current) use of anticoagulants: Secondary | ICD-10-CM | POA: Diagnosis not present

## 2022-03-22 DIAGNOSIS — Z951 Presence of aortocoronary bypass graft: Secondary | ICD-10-CM | POA: Diagnosis not present

## 2022-03-22 DIAGNOSIS — Z7984 Long term (current) use of oral hypoglycemic drugs: Secondary | ICD-10-CM | POA: Diagnosis not present

## 2022-03-22 DIAGNOSIS — I2510 Atherosclerotic heart disease of native coronary artery without angina pectoris: Secondary | ICD-10-CM | POA: Diagnosis not present

## 2022-03-22 DIAGNOSIS — Z9181 History of falling: Secondary | ICD-10-CM | POA: Diagnosis not present

## 2022-03-27 DIAGNOSIS — E7800 Pure hypercholesterolemia, unspecified: Secondary | ICD-10-CM | POA: Diagnosis not present

## 2022-03-27 DIAGNOSIS — I4892 Unspecified atrial flutter: Secondary | ICD-10-CM | POA: Diagnosis not present

## 2022-03-27 DIAGNOSIS — M21372 Foot drop, left foot: Secondary | ICD-10-CM | POA: Diagnosis not present

## 2022-03-27 DIAGNOSIS — E785 Hyperlipidemia, unspecified: Secondary | ICD-10-CM | POA: Diagnosis not present

## 2022-03-27 DIAGNOSIS — E119 Type 2 diabetes mellitus without complications: Secondary | ICD-10-CM | POA: Diagnosis not present

## 2022-03-27 DIAGNOSIS — Z7984 Long term (current) use of oral hypoglycemic drugs: Secondary | ICD-10-CM | POA: Diagnosis not present

## 2022-03-27 DIAGNOSIS — Z9181 History of falling: Secondary | ICD-10-CM | POA: Diagnosis not present

## 2022-03-27 DIAGNOSIS — M5116 Intervertebral disc disorders with radiculopathy, lumbar region: Secondary | ICD-10-CM | POA: Diagnosis not present

## 2022-03-27 DIAGNOSIS — I2510 Atherosclerotic heart disease of native coronary artery without angina pectoris: Secondary | ICD-10-CM | POA: Diagnosis not present

## 2022-03-27 DIAGNOSIS — I69398 Other sequelae of cerebral infarction: Secondary | ICD-10-CM | POA: Diagnosis not present

## 2022-03-27 DIAGNOSIS — E039 Hypothyroidism, unspecified: Secondary | ICD-10-CM | POA: Diagnosis not present

## 2022-03-27 DIAGNOSIS — K5900 Constipation, unspecified: Secondary | ICD-10-CM | POA: Diagnosis not present

## 2022-03-27 DIAGNOSIS — D6869 Other thrombophilia: Secondary | ICD-10-CM | POA: Diagnosis not present

## 2022-03-27 DIAGNOSIS — Z7901 Long term (current) use of anticoagulants: Secondary | ICD-10-CM | POA: Diagnosis not present

## 2022-03-27 DIAGNOSIS — I4891 Unspecified atrial fibrillation: Secondary | ICD-10-CM | POA: Diagnosis not present

## 2022-03-27 DIAGNOSIS — J449 Chronic obstructive pulmonary disease, unspecified: Secondary | ICD-10-CM | POA: Diagnosis not present

## 2022-03-27 DIAGNOSIS — Z87891 Personal history of nicotine dependence: Secondary | ICD-10-CM | POA: Diagnosis not present

## 2022-03-27 DIAGNOSIS — E1151 Type 2 diabetes mellitus with diabetic peripheral angiopathy without gangrene: Secondary | ICD-10-CM | POA: Diagnosis not present

## 2022-03-27 DIAGNOSIS — Z951 Presence of aortocoronary bypass graft: Secondary | ICD-10-CM | POA: Diagnosis not present

## 2022-03-27 DIAGNOSIS — G3184 Mild cognitive impairment, so stated: Secondary | ICD-10-CM | POA: Diagnosis not present

## 2022-03-27 DIAGNOSIS — I700 Atherosclerosis of aorta: Secondary | ICD-10-CM | POA: Diagnosis not present

## 2022-03-27 DIAGNOSIS — I25119 Atherosclerotic heart disease of native coronary artery with unspecified angina pectoris: Secondary | ICD-10-CM | POA: Diagnosis not present

## 2022-03-27 DIAGNOSIS — I252 Old myocardial infarction: Secondary | ICD-10-CM | POA: Diagnosis not present

## 2022-03-27 DIAGNOSIS — E663 Overweight: Secondary | ICD-10-CM | POA: Diagnosis not present

## 2022-03-27 DIAGNOSIS — G8929 Other chronic pain: Secondary | ICD-10-CM | POA: Diagnosis not present

## 2022-03-27 DIAGNOSIS — R69 Illness, unspecified: Secondary | ICD-10-CM | POA: Diagnosis not present

## 2022-03-27 DIAGNOSIS — H53461 Homonymous bilateral field defects, right side: Secondary | ICD-10-CM | POA: Diagnosis not present

## 2022-03-27 DIAGNOSIS — Z7951 Long term (current) use of inhaled steroids: Secondary | ICD-10-CM | POA: Diagnosis not present

## 2022-04-01 ENCOUNTER — Telehealth: Payer: Self-pay | Admitting: Family Medicine

## 2022-04-01 DIAGNOSIS — Z9181 History of falling: Secondary | ICD-10-CM | POA: Diagnosis not present

## 2022-04-01 DIAGNOSIS — M5116 Intervertebral disc disorders with radiculopathy, lumbar region: Secondary | ICD-10-CM | POA: Diagnosis not present

## 2022-04-01 DIAGNOSIS — Z7984 Long term (current) use of oral hypoglycemic drugs: Secondary | ICD-10-CM | POA: Diagnosis not present

## 2022-04-01 DIAGNOSIS — E7800 Pure hypercholesterolemia, unspecified: Secondary | ICD-10-CM | POA: Diagnosis not present

## 2022-04-01 DIAGNOSIS — Z7901 Long term (current) use of anticoagulants: Secondary | ICD-10-CM | POA: Diagnosis not present

## 2022-04-01 DIAGNOSIS — I4891 Unspecified atrial fibrillation: Secondary | ICD-10-CM | POA: Diagnosis not present

## 2022-04-01 DIAGNOSIS — M21372 Foot drop, left foot: Secondary | ICD-10-CM | POA: Diagnosis not present

## 2022-04-01 DIAGNOSIS — I2510 Atherosclerotic heart disease of native coronary artery without angina pectoris: Secondary | ICD-10-CM | POA: Diagnosis not present

## 2022-04-01 DIAGNOSIS — J449 Chronic obstructive pulmonary disease, unspecified: Secondary | ICD-10-CM | POA: Diagnosis not present

## 2022-04-01 DIAGNOSIS — H53461 Homonymous bilateral field defects, right side: Secondary | ICD-10-CM | POA: Diagnosis not present

## 2022-04-01 DIAGNOSIS — I4892 Unspecified atrial flutter: Secondary | ICD-10-CM | POA: Diagnosis not present

## 2022-04-01 DIAGNOSIS — Z7951 Long term (current) use of inhaled steroids: Secondary | ICD-10-CM | POA: Diagnosis not present

## 2022-04-01 DIAGNOSIS — I69398 Other sequelae of cerebral infarction: Secondary | ICD-10-CM | POA: Diagnosis not present

## 2022-04-01 DIAGNOSIS — I252 Old myocardial infarction: Secondary | ICD-10-CM | POA: Diagnosis not present

## 2022-04-01 DIAGNOSIS — K5900 Constipation, unspecified: Secondary | ICD-10-CM | POA: Diagnosis not present

## 2022-04-01 DIAGNOSIS — Z87891 Personal history of nicotine dependence: Secondary | ICD-10-CM | POA: Diagnosis not present

## 2022-04-01 DIAGNOSIS — Z951 Presence of aortocoronary bypass graft: Secondary | ICD-10-CM | POA: Diagnosis not present

## 2022-04-01 DIAGNOSIS — E119 Type 2 diabetes mellitus without complications: Secondary | ICD-10-CM | POA: Diagnosis not present

## 2022-04-01 NOTE — Telephone Encounter (Signed)
Attempted to schedule AWV. Unable to LVM.  Will try at later time.  

## 2022-04-08 DIAGNOSIS — E1122 Type 2 diabetes mellitus with diabetic chronic kidney disease: Secondary | ICD-10-CM | POA: Diagnosis not present

## 2022-04-08 DIAGNOSIS — I10 Essential (primary) hypertension: Secondary | ICD-10-CM | POA: Diagnosis not present

## 2022-04-08 DIAGNOSIS — R600 Localized edema: Secondary | ICD-10-CM | POA: Diagnosis not present

## 2022-04-08 DIAGNOSIS — N1832 Chronic kidney disease, stage 3b: Secondary | ICD-10-CM | POA: Diagnosis not present

## 2022-04-23 DIAGNOSIS — D2272 Melanocytic nevi of left lower limb, including hip: Secondary | ICD-10-CM | POA: Diagnosis not present

## 2022-04-23 DIAGNOSIS — D2262 Melanocytic nevi of left upper limb, including shoulder: Secondary | ICD-10-CM | POA: Diagnosis not present

## 2022-04-23 DIAGNOSIS — D485 Neoplasm of uncertain behavior of skin: Secondary | ICD-10-CM | POA: Diagnosis not present

## 2022-04-23 DIAGNOSIS — C44612 Basal cell carcinoma of skin of right upper limb, including shoulder: Secondary | ICD-10-CM | POA: Diagnosis not present

## 2022-04-23 DIAGNOSIS — L821 Other seborrheic keratosis: Secondary | ICD-10-CM | POA: Diagnosis not present

## 2022-04-23 DIAGNOSIS — D225 Melanocytic nevi of trunk: Secondary | ICD-10-CM | POA: Diagnosis not present

## 2022-04-23 DIAGNOSIS — L570 Actinic keratosis: Secondary | ICD-10-CM | POA: Diagnosis not present

## 2022-04-23 DIAGNOSIS — D2271 Melanocytic nevi of right lower limb, including hip: Secondary | ICD-10-CM | POA: Diagnosis not present

## 2022-04-23 DIAGNOSIS — D2261 Melanocytic nevi of right upper limb, including shoulder: Secondary | ICD-10-CM | POA: Diagnosis not present

## 2022-04-26 ENCOUNTER — Emergency Department: Payer: Medicare HMO

## 2022-04-26 ENCOUNTER — Other Ambulatory Visit: Payer: Self-pay

## 2022-04-26 DIAGNOSIS — M4312 Spondylolisthesis, cervical region: Secondary | ICD-10-CM | POA: Diagnosis not present

## 2022-04-26 DIAGNOSIS — W01198A Fall on same level from slipping, tripping and stumbling with subsequent striking against other object, initial encounter: Secondary | ICD-10-CM | POA: Diagnosis not present

## 2022-04-26 DIAGNOSIS — S199XXA Unspecified injury of neck, initial encounter: Secondary | ICD-10-CM | POA: Diagnosis not present

## 2022-04-26 DIAGNOSIS — S0990XA Unspecified injury of head, initial encounter: Secondary | ICD-10-CM | POA: Insufficient documentation

## 2022-04-26 IMAGING — CT CT CERVICAL SPINE W/O CM
3 of 4 series · 12 of 33 positions shown, 14 images · non-contrast
Comparison: CT head [DATE]

CLINICAL DATA: fall on eleiquis; Head trauma, moderate-severe



[Series 4: sagittal bone · sagittal · 0.30mm/px · 5 of 62 slices shown, 6 images]
[im 21/62  bone]
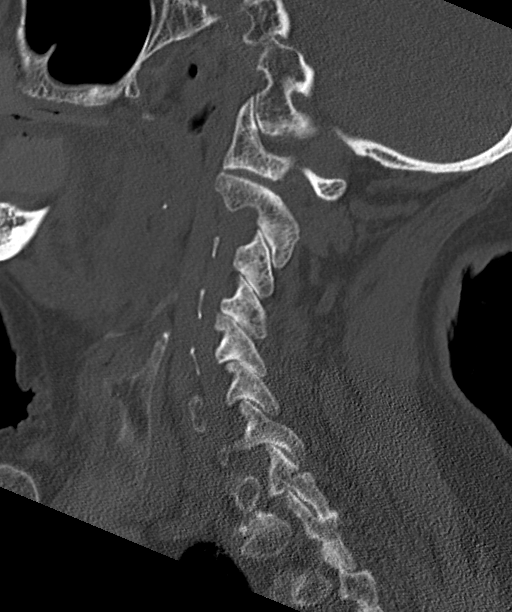
[im 26/62  bone]
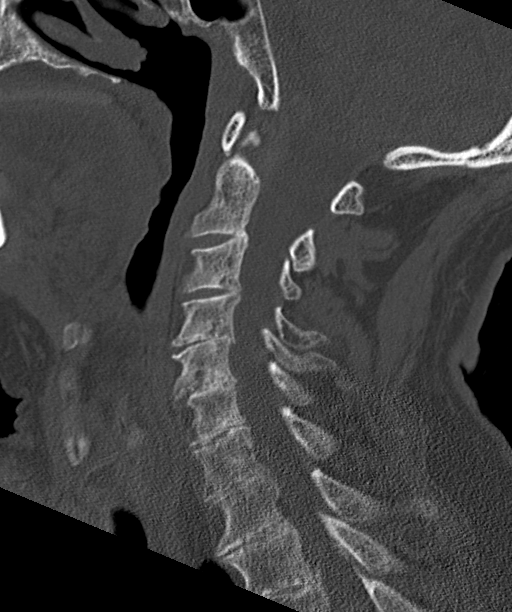
[im 31/62  soft-tissue]
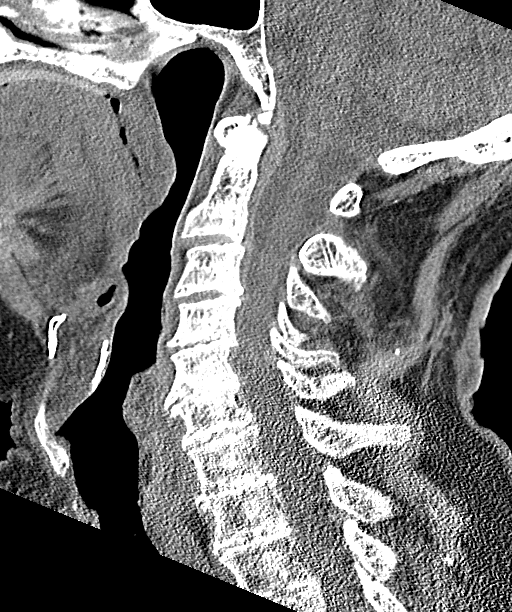
[im 31/62  bone]
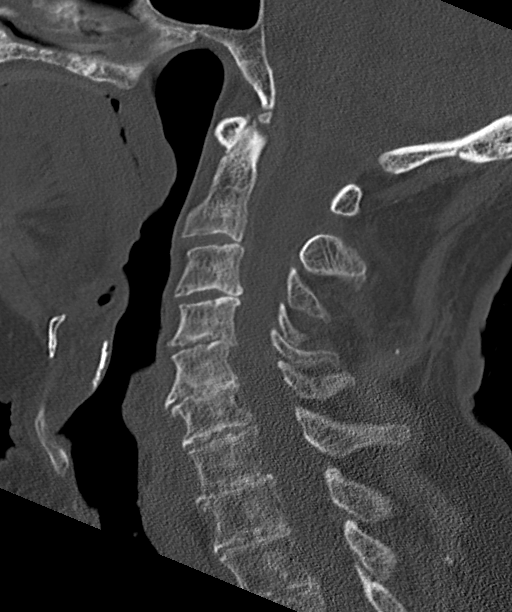
[im 36/62  bone]
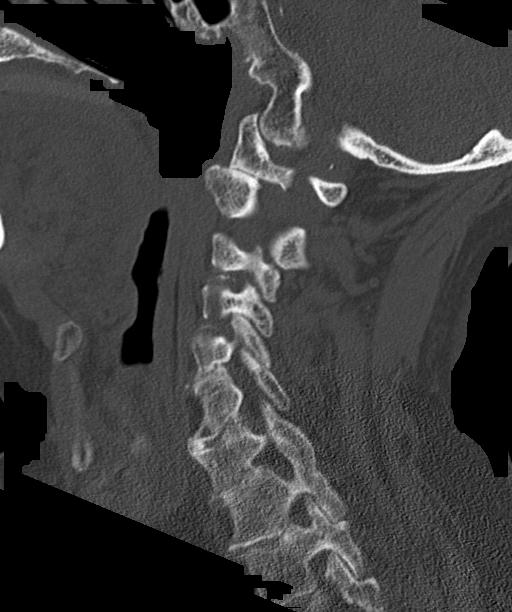
[im 41/62  bone]
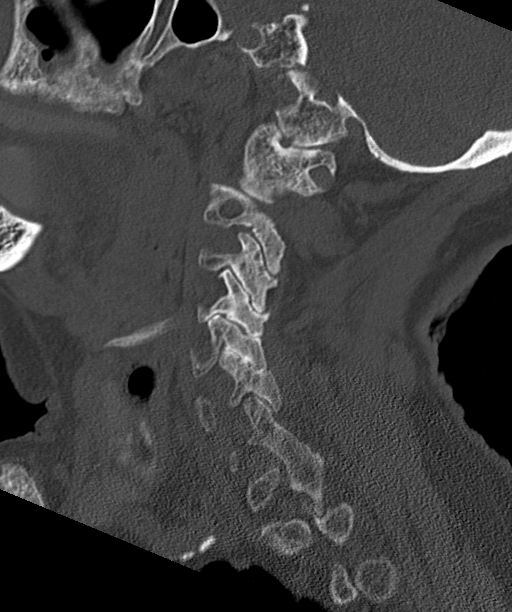

[Series 5: coronal bone · coronal · 0.23mm/px · 3 of 61 slices shown]
[im 13/61  bone]
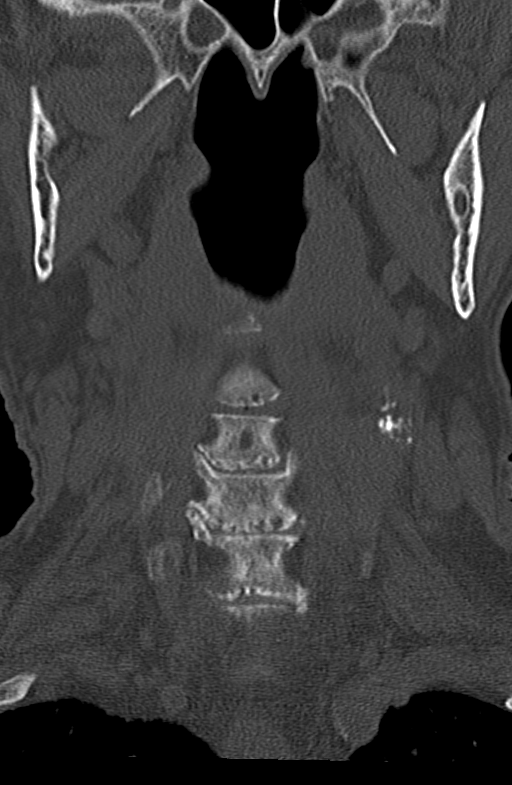
[im 25/61  bone]
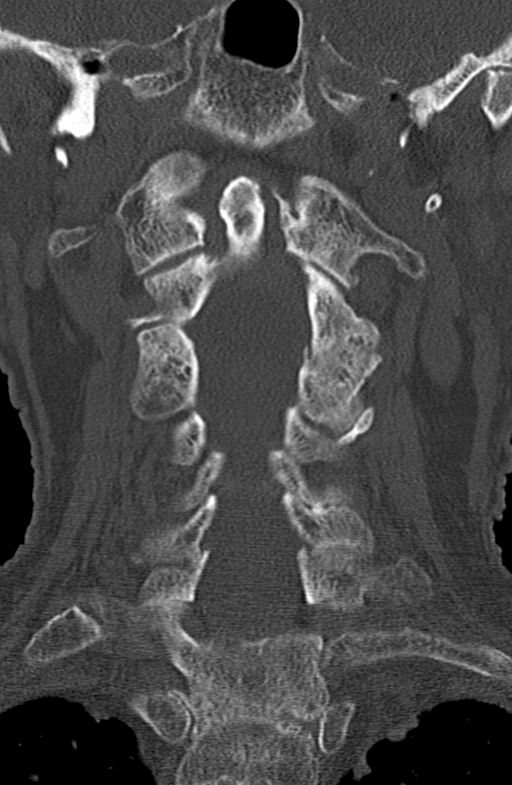
[im 36/61  bone]
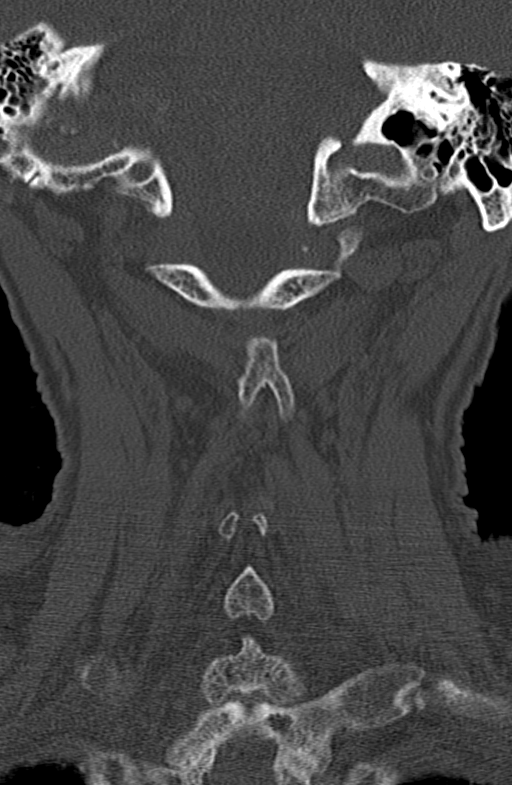

[Series 6: orthogonal bone · axial · 0.21mm/px · z∈[+210,+328]mm · 4 of 97 slices shown, 5 images]
[im 17/97  soft-tissue]
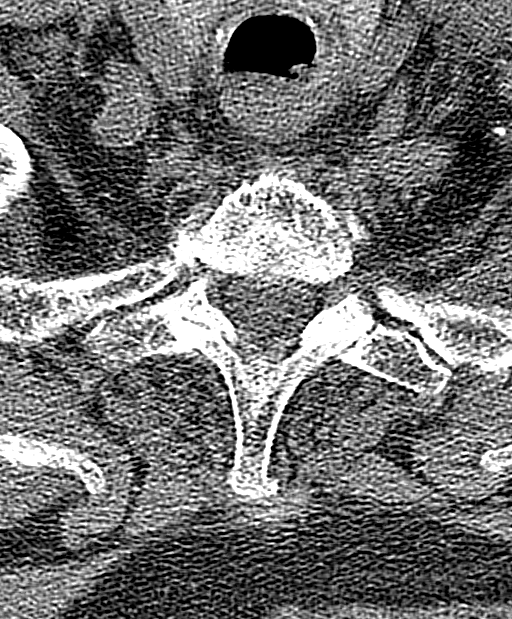
[im 17/97  bone]
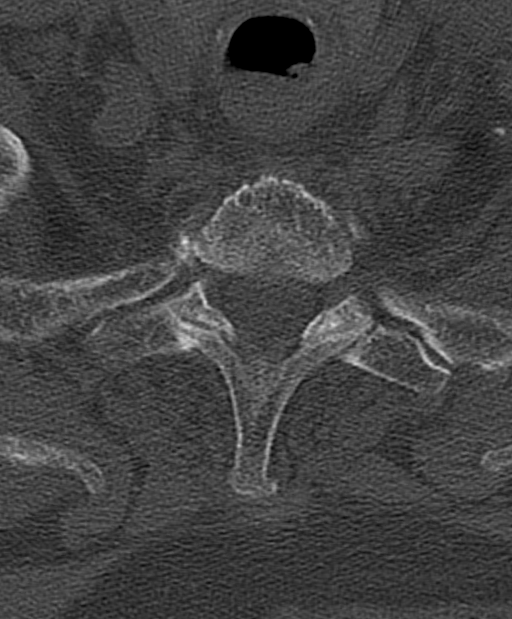
[im 33/97  bone]
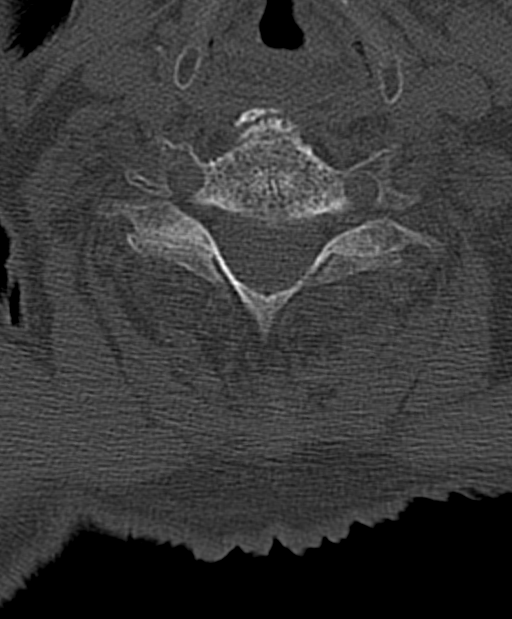
[im 65/97  bone]
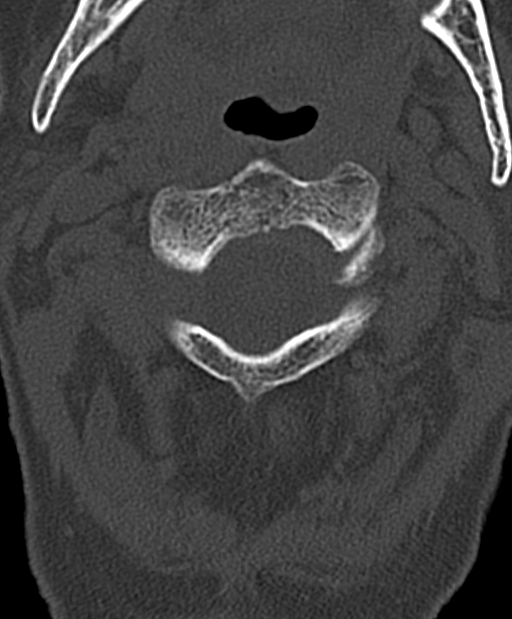
[im 81/97  bone]
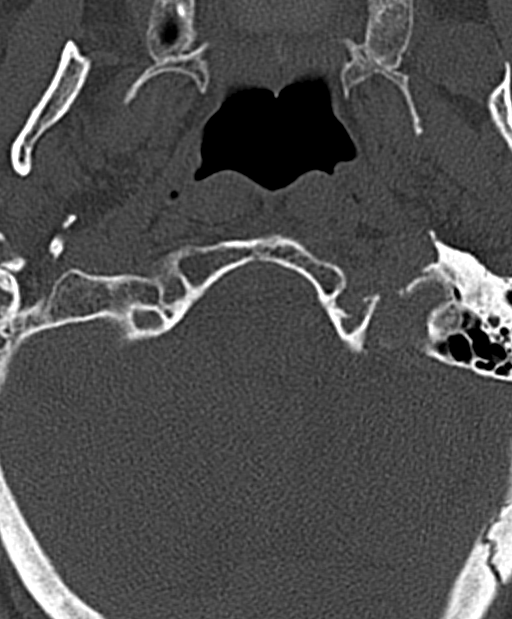

[12 of 33 positions shown; findings below may reference images not displayed]

FINDINGS: CT HEAD FINDINGS

Brain: Normal anatomic configuration. Parenchymal volume loss is
commensurate with the patient's age. Mild periventricular white
matter changes are present likely reflecting the sequela of small
vessel ischemia. Remote left occipital cortical infarct again noted
with compensatory ex vacuo dilation of the except ule or of the left
lateral ventricle. No abnormal intra or extra-axial mass lesion or
fluid collection. No abnormal mass effect or midline shift. No
evidence of acute intracranial hemorrhage or infarct. Ventricular
size is normal. Cerebellum unremarkable.

Vascular: No asymmetric hyperdense vasculature at the skull base.

Skull: Intact

Sinuses/Orbits: Paranasal sinuses are clear. Ocular lenses have been
removed. Orbits are otherwise unremarkable.

Other: Mastoid air cells and middle ear cavities are clear.

CT CERVICAL SPINE FINDINGS

Alignment: 2 mm retrolisthesis C4-5, likely degenerative in nature.
Otherwise normal cervical lordosis.

Skull base and vertebrae: Osseous structures are diffusely
osteopenic. Craniocervical alignment is normal. Atlantodental
interval is not widened. No acute fracture of the cervical spine.
Vertebral body height is preserved. There is ankylosis of the left
facets of C7-T1.

Soft tissues and spinal canal: No prevertebral fluid or swelling. No
visible canal hematoma. Extensive atherosclerotic calcification
within the carotid bifurcations, left greater than right. No
pathologic cervical adenopathy.

Disc levels: There is diffuse intervertebral disc space narrowing
and endplate remodeling throughout the cervical spine in keeping
with changes of advanced degenerative disc disease. Prevertebral
soft tissues are not thickened on sagittal reformats. Review of the
axial images demonstrates moderate right and mild left
neuroforaminal narrowing at C4-5. Mild left neuroforaminal narrowing
is noted at C3-4 and on the right at C5-6. Remaining neural foramina
appear widely patent.

Upper chest: Unremarkable

Other: None
IMPRESSION: No acute intracranial injury.  No calvarial fracture.

No acute fracture or listhesis of the cervical spine.

## 2022-04-26 NOTE — ED Triage Notes (Addendum)
Pt was getting out of bed, tripped and hit head on side of bed. No loc. Small bump on right side of head. No other injurys. Pt denies any pain. Takes eliquis. Pt in no acute distress at this time.

## 2022-04-26 NOTE — ED Notes (Signed)
Dr. Cheri Fowler notified of pts situation and appropriate orders for pt at this time.

## 2022-04-27 ENCOUNTER — Emergency Department
Admission: EM | Admit: 2022-04-27 | Discharge: 2022-04-27 | Disposition: A | Discharge status: Home / Self Care | Payer: Medicare HMO | Attending: Emergency Medicine | Admitting: Emergency Medicine

## 2022-04-27 DIAGNOSIS — W19XXXA Unspecified fall, initial encounter: Secondary | ICD-10-CM

## 2022-04-27 DIAGNOSIS — S0990XA Unspecified injury of head, initial encounter: Secondary | ICD-10-CM

## 2022-04-27 NOTE — ED Provider Notes (Signed)
   [facility] Provider Note    Event Date/Time   First MD Initiated Contact with Patient 04/27/22 0037     (approximate)   History   Fall   HPI  Devin Reese is a 88 y.o. male who tripped and fell hitting his head on the bed.  He has a small lump on his head.  He denies any headache nausea vomiting blurry vision neck pain or any other problems.  He says he feels fine.      Physical Exam   Triage Vital Signs: ED Triage Vitals  Enc Vitals Group     BP 04/26/22 2205 118/83     Pulse Rate 04/26/22 2205 81     Resp 04/26/22 2205 16     Temp 04/26/22 2205 97.6 F (36.4 C)     Temp Source 04/26/22 2205 Oral     SpO2 04/26/22 2210 95 %     Weight 04/26/22 2206 185 lb (83.9 kg)     Height 04/26/22 2206 '5\' 8"'$  (1.727 m)     Head Circumference --      Peak Flow --      Pain Score --      Pain Loc --      Pain Edu? --      Excl. in [place]? --     Most recent vital signs: Vitals:   04/26/22 2210 04/27/22 0050  BP:  132/65  Pulse:  61  Resp:    Temp:    SpO2: 95% 99%     General: Awake, alert, no distress.  Head normocephalic atraumatic except for a nickel sized lump on the right side of the head. Neck: Supple nontender CV:  Good peripheral perfusion.  Heart regular rate and rhythm no audible murmurs Resp:  Normal effort.  Lungs are clear Chest: Not tender Abd:  No distention.  Not tender Extremities: No tenderness everything moves well and normally.   ED Results / Procedures / Treatments   Labs (all labs ordered are listed, but only abnormal results are displayed) Labs Reviewed - No data to display   EKG     RADIOLOGY CT of the head and neck reviewed by me shows no acute pathology radiology read the films immediately after and did not see anything acute either.   PROCEDURES:  Critical Care performed:   Procedures   MEDICATIONS ORDERED IN ED: Medications - No data to display   IMPRESSION / MDM / [place]  / ED COURSE  I reviewed the triage vital signs and the nursing notes.  Patient reports he tripped and fell.  He remembers everything.  He did not pass out.  He feels well now.  He does have a small bump on his head but CT is negative. Patient given head injury instructions but with a negative CT 2 hours or more after falling he should be okay.  Patient's presentation is most consistent with acute, uncomplicated illness.    FINAL CLINICAL IMPRESSION(S) / ED DIAGNOSES   Final diagnoses:  Fall, initial encounter  Injury of head, initial encounter     Rx / DC Orders   ED Discharge Orders     None        Note:  This document was prepared using Dragon voice recognition software and may include unintentional dictation errors.   Nena Polio, MD 04/27/22 [phone]

## 2022-04-27 NOTE — Discharge Instructions (Addendum)
I have given you instructions for head injury and falls.  It might be a good idea if you had somebody with you at home tonight.  Its not absolutely necessary though.  Please return immediately if you develop anything more than a very mild headache or any vomiting.  The CT scans were normal and you are acting normal everything should be fine.  You do not have to avoid physical or mental activity.

## 2022-05-01 ENCOUNTER — Encounter: Payer: Self-pay | Admitting: Family Medicine

## 2022-05-01 ENCOUNTER — Ambulatory Visit (INDEPENDENT_AMBULATORY_CARE_PROVIDER_SITE_OTHER): Payer: Medicare HMO | Admitting: Family Medicine

## 2022-05-01 VITALS — BP 118/80 | HR 57 | Temp 97.6°F | Ht 70.0 in | Wt 148.4 lb

## 2022-05-01 DIAGNOSIS — M21372 Foot drop, left foot: Secondary | ICD-10-CM | POA: Diagnosis not present

## 2022-05-01 DIAGNOSIS — I4892 Unspecified atrial flutter: Secondary | ICD-10-CM | POA: Diagnosis not present

## 2022-05-01 DIAGNOSIS — W19XXXA Unspecified fall, initial encounter: Secondary | ICD-10-CM

## 2022-05-01 DIAGNOSIS — E785 Hyperlipidemia, unspecified: Secondary | ICD-10-CM | POA: Diagnosis not present

## 2022-05-01 DIAGNOSIS — E119 Type 2 diabetes mellitus without complications: Secondary | ICD-10-CM

## 2022-05-01 DIAGNOSIS — E039 Hypothyroidism, unspecified: Secondary | ICD-10-CM

## 2022-05-01 LAB — COMPREHENSIVE METABOLIC PANEL
ALT: 7 U/L (ref 0–53)
AST: 15 U/L (ref 0–37)
Albumin: 3.7 g/dL (ref 3.5–5.2)
Alkaline Phosphatase: 84 U/L (ref 39–117)
BUN: 27 mg/dL — ABNORMAL HIGH (ref 6–23)
CO2: 28 mEq/L (ref 19–32)
Calcium: 9 mg/dL (ref 8.4–10.5)
Chloride: 104 mEq/L (ref 96–112)
Creatinine, Ser: 1.76 mg/dL — ABNORMAL HIGH (ref 0.40–1.50)
GFR: 32.94 mL/min — ABNORMAL LOW (ref >60.00)
Glucose, Bld: 117 mg/dL — ABNORMAL HIGH (ref 70–99)
Potassium: 4.4 mEq/L (ref 3.5–5.1)
Sodium: 139 mEq/L (ref 135–145)
Total Bilirubin: 0.7 mg/dL (ref 0.2–1.2)
Total Protein: 6.3 g/dL (ref 6.0–8.3)

## 2022-05-01 LAB — HEMOGLOBIN A1C: Hgb A1c MFr Bld: 6.9 % — ABNORMAL HIGH (ref 4.6–6.5)

## 2022-05-01 LAB — MICROALBUMIN / CREATININE URINE RATIO
Creatinine,U: 144.2 mg/dL
Microalb Creat Ratio: 0.6 mg/g (ref 0.0–30.0)
Microalb, Ur: 0.8 mg/dL (ref 0.0–1.9)

## 2022-05-01 LAB — LIPID PANEL
Cholesterol: 130 mg/dL (ref 0–200)
HDL: 48.7 mg/dL (ref >39.00)
LDL Cholesterol: 65 mg/dL (ref 0–99)
NonHDL: 81.34
Total CHOL/HDL Ratio: 3
Triglycerides: 80 mg/dL (ref 0.0–149.0)
VLDL: 16 mg/dL (ref 0.0–40.0)

## 2022-05-01 LAB — TSH: TSH: 2.82 u[IU]/mL (ref 0.35–5.50)

## 2022-05-01 NOTE — Assessment & Plan Note (Signed)
Check TSH.  Continue Synthroid 25 mcg daily. 

## 2022-05-01 NOTE — Assessment & Plan Note (Signed)
Check A1c.  Continue Tradjenta 5 mg daily. 

## 2022-05-01 NOTE — Assessment & Plan Note (Signed)
Sinus rhythm.  He will continue Eliquis 2.5 mg twice daily.  Discussed if he ever hit his head again in the future he would need to be evaluated by provider to get a CT scan of his head.

## 2022-05-01 NOTE — Progress Notes (Signed)
[name], MD Phone: [phone]  Devin Reese is a 88 y.o. male who presents today for f/u.  DIABETES Disease Monitoring: Blood Sugar ranges-<150 Polyuria/phagia/dipsia- no      Optho- UTD Medications: Compliance- tradjenta Hypoglycemic symptoms- no  HYPOTHYROIDISM Disease Monitoring Weight changes: no  Skin Changes: no Heat/Cold intolerance: no  Medication Monitoring Compliance:  taking synthroid   Last TSH:   Lab Results  Component Value Date   TSH 2.10 10/31/2021   Atrial flutter: Patient is on Eliquis.  No palpitations.  No bleeding issues.  Fall: The patient had a fall last week.  He came in from working outside and laid down on the bed.  He subsequently got up and stumbled and fell and hit his head very minimally on the bedside table.  He ended up going to the emergency department and had a negative CT head and cervical spine.  He was wearing his shoes.  They do not have any area rugs in the area that he fell.  He noted no syncope.  Left foot drop: Patient has been evaluated by orthopedics and physical medicine and rehab.  Neurosurgery deferred evaluation given that he had been evaluated by orthopedics.  He has been using an AFO brace.  He has been doing home health physical therapy that was beneficial.  Notes physical therapy has been completed.   Social History   Tobacco Use  Smoking Status Former   Packs/day: 1.00   Years: 25.00   Total pack years: 25.00   Types: Cigarettes  Smokeless Tobacco Never    Current Outpatient Medications on File Prior to Visit  Medication Sig Dispense Refill   albuterol (VENTOLIN HFA) 108 (90 Base) MCG/ACT inhaler Inhale 1-2 puffs into the lungs every 4 (four) hours as needed for wheezing or shortness of breath. 1 each 0   apixaban (ELIQUIS) 2.5 MG TABS tablet Take 1 tablet (2.5 mg total) by mouth 2 (two) times daily. 60 tablet 6   atorvastatin (LIPITOR) 40 MG tablet Take 1 tablet (40 mg total) by mouth daily. 90  tablet 0   bisacodyl (DULCOLAX) 5 MG EC tablet Take by mouth.     Fluticasone-Umeclidin-Vilant (TRELEGY ELLIPTA IN) Inhale 1 puff into the lungs daily.     levothyroxine (SYNTHROID) 25 MCG tablet Take 1 tablet (25 mcg total) by mouth daily before breakfast. 90 tablet 0   linagliptin (TRADJENTA) 5 MG TABS tablet Take 1 tablet (5 mg total) by mouth daily. 90 tablet 1   nitroGLYCERIN (NITROSTAT) 0.4 MG SL tablet Place 1 tablet (0.4 mg total) under the tongue every 5 (five) minutes as needed for chest pain. 25 tablet 3   No current facility-administered medications on file prior to visit.     ROS see history of present illness  Objective  Physical Exam Vitals:   05/01/22 1141  BP: 118/80  Pulse: (!) 57  Temp: 97.6 F (36.4 C)  SpO2: 97%    BP Readings from Last 3 Encounters:  05/01/22 118/80  04/27/22 132/65  12/18/21 122/80   Wt Readings from Last 3 Encounters:  05/01/22 148 lb 6.4 oz (67.3 kg)  04/26/22 185 lb (83.9 kg)  12/18/21 165 lb (74.8 kg)    Physical Exam Constitutional:      General: He is not in acute distress.    Appearance: He is not diaphoretic.  Cardiovascular:     Rate and Rhythm: Normal rate and regular rhythm.     Heart sounds: Normal heart sounds.  Pulmonary:  Effort: Pulmonary effort is normal.     Breath sounds: Normal breath sounds.  Musculoskeletal:     Right lower leg: No edema.     Left lower leg: No edema.  Skin:    General: Skin is warm and dry.  Neurological:     Mental Status: He is alert.    Diabetic Foot Exam - Simple   Simple Foot Form Diabetic Foot exam was performed with the following findings: Yes 05/01/2022 11:54 AM  Visual Inspection See comments: Yes Sensation Testing See comments: Yes Pulse Check Posterior Tibialis and Dorsalis pulse intact bilaterally: Yes Comments Dorsal bilateral feet with absent monofilament sensation, otherwise sensation light touch and monofilament testing intact bilaterally, onychomycosis  bilateral toes, no other deformities, ulcerations, or skin breakdown      Assessment/Plan: Please see individual problem list.  Problem List Items Addressed This Visit     Atrial flutter ([place]) - Primary (Chronic)    Sinus rhythm.  He will continue Eliquis 2.5 mg twice daily.  Discussed if he ever hit his head again in the future he would need to be evaluated by provider to get a CT scan of his head.      Diabetes ([facility]) (Chronic)    Check A1c.  Continue Tradjenta 5 mg daily.      Relevant Orders   HgB A1c   Urine Microalbumin w/creat. ratio   Hyperlipidemia (Chronic)    Check lipid panel.  Continue Lipitor 40 mg daily.      Relevant Orders   Comp Met (CMET)   Lipid panel   Hypothyroidism (Chronic)    Check TSH.  Continue Synthroid 25 mcg daily.      Relevant Orders   TSH   Left foot drop (Chronic)    Refer for more home health PT.      Relevant Orders   Ambulatory referral to [place]    Discussed falls precautions.  Advised to remove any area rugs from the home.  Discussed wearing well fitting shoes.  Home health PT referral placed.      Relevant Orders   Ambulatory referral to [place]    Return in about 3 months (around 08/01/2022).   [name], MD [place]

## 2022-05-01 NOTE — Assessment & Plan Note (Signed)
Refer for more home health PT.

## 2022-05-01 NOTE — Assessment & Plan Note (Signed)
Discussed falls precautions.  Advised to remove any area rugs from the home.  Discussed wearing well fitting shoes.  Home health PT referral placed.

## 2022-05-01 NOTE — Assessment & Plan Note (Signed)
Check lipid panel.  Continue Lipitor 40 mg daily. 

## 2022-05-01 NOTE — Patient Instructions (Signed)
Nice to see you. Home health should contact you to set up an appointment.  We will contact you with your labs.

## 2022-05-06 DIAGNOSIS — E785 Hyperlipidemia, unspecified: Secondary | ICD-10-CM | POA: Diagnosis not present

## 2022-05-06 DIAGNOSIS — J449 Chronic obstructive pulmonary disease, unspecified: Secondary | ICD-10-CM | POA: Diagnosis not present

## 2022-05-06 DIAGNOSIS — I4892 Unspecified atrial flutter: Secondary | ICD-10-CM | POA: Diagnosis not present

## 2022-05-06 DIAGNOSIS — Z87891 Personal history of nicotine dependence: Secondary | ICD-10-CM | POA: Diagnosis not present

## 2022-05-06 DIAGNOSIS — Z7951 Long term (current) use of inhaled steroids: Secondary | ICD-10-CM | POA: Diagnosis not present

## 2022-05-06 DIAGNOSIS — E119 Type 2 diabetes mellitus without complications: Secondary | ICD-10-CM | POA: Diagnosis not present

## 2022-05-06 DIAGNOSIS — Z9181 History of falling: Secondary | ICD-10-CM | POA: Diagnosis not present

## 2022-05-06 DIAGNOSIS — Z7901 Long term (current) use of anticoagulants: Secondary | ICD-10-CM | POA: Diagnosis not present

## 2022-05-06 DIAGNOSIS — I4891 Unspecified atrial fibrillation: Secondary | ICD-10-CM | POA: Diagnosis not present

## 2022-05-06 DIAGNOSIS — M21372 Foot drop, left foot: Secondary | ICD-10-CM | POA: Diagnosis not present

## 2022-05-06 DIAGNOSIS — Z7984 Long term (current) use of oral hypoglycemic drugs: Secondary | ICD-10-CM | POA: Diagnosis not present

## 2022-05-06 DIAGNOSIS — E039 Hypothyroidism, unspecified: Secondary | ICD-10-CM | POA: Diagnosis not present

## 2022-05-10 DIAGNOSIS — E785 Hyperlipidemia, unspecified: Secondary | ICD-10-CM | POA: Diagnosis not present

## 2022-05-10 DIAGNOSIS — Z87891 Personal history of nicotine dependence: Secondary | ICD-10-CM | POA: Diagnosis not present

## 2022-05-10 DIAGNOSIS — I4892 Unspecified atrial flutter: Secondary | ICD-10-CM | POA: Diagnosis not present

## 2022-05-10 DIAGNOSIS — Z7951 Long term (current) use of inhaled steroids: Secondary | ICD-10-CM | POA: Diagnosis not present

## 2022-05-10 DIAGNOSIS — Z9181 History of falling: Secondary | ICD-10-CM | POA: Diagnosis not present

## 2022-05-10 DIAGNOSIS — Z7901 Long term (current) use of anticoagulants: Secondary | ICD-10-CM | POA: Diagnosis not present

## 2022-05-10 DIAGNOSIS — J449 Chronic obstructive pulmonary disease, unspecified: Secondary | ICD-10-CM | POA: Diagnosis not present

## 2022-05-10 DIAGNOSIS — E119 Type 2 diabetes mellitus without complications: Secondary | ICD-10-CM | POA: Diagnosis not present

## 2022-05-10 DIAGNOSIS — E039 Hypothyroidism, unspecified: Secondary | ICD-10-CM | POA: Diagnosis not present

## 2022-05-10 DIAGNOSIS — Z7984 Long term (current) use of oral hypoglycemic drugs: Secondary | ICD-10-CM | POA: Diagnosis not present

## 2022-05-10 DIAGNOSIS — M21372 Foot drop, left foot: Secondary | ICD-10-CM | POA: Diagnosis not present

## 2022-05-13 DIAGNOSIS — M21372 Foot drop, left foot: Secondary | ICD-10-CM | POA: Diagnosis not present

## 2022-05-13 DIAGNOSIS — J449 Chronic obstructive pulmonary disease, unspecified: Secondary | ICD-10-CM | POA: Diagnosis not present

## 2022-05-13 DIAGNOSIS — Z9181 History of falling: Secondary | ICD-10-CM | POA: Diagnosis not present

## 2022-05-13 DIAGNOSIS — Z7901 Long term (current) use of anticoagulants: Secondary | ICD-10-CM | POA: Diagnosis not present

## 2022-05-13 DIAGNOSIS — E039 Hypothyroidism, unspecified: Secondary | ICD-10-CM | POA: Diagnosis not present

## 2022-05-13 DIAGNOSIS — E785 Hyperlipidemia, unspecified: Secondary | ICD-10-CM | POA: Diagnosis not present

## 2022-05-13 DIAGNOSIS — Z7951 Long term (current) use of inhaled steroids: Secondary | ICD-10-CM | POA: Diagnosis not present

## 2022-05-13 DIAGNOSIS — I4892 Unspecified atrial flutter: Secondary | ICD-10-CM | POA: Diagnosis not present

## 2022-05-13 DIAGNOSIS — Z87891 Personal history of nicotine dependence: Secondary | ICD-10-CM | POA: Diagnosis not present

## 2022-05-13 DIAGNOSIS — Z7984 Long term (current) use of oral hypoglycemic drugs: Secondary | ICD-10-CM | POA: Diagnosis not present

## 2022-05-13 DIAGNOSIS — E119 Type 2 diabetes mellitus without complications: Secondary | ICD-10-CM | POA: Diagnosis not present

## 2022-05-14 DIAGNOSIS — Z7984 Long term (current) use of oral hypoglycemic drugs: Secondary | ICD-10-CM | POA: Diagnosis not present

## 2022-05-14 DIAGNOSIS — E785 Hyperlipidemia, unspecified: Secondary | ICD-10-CM | POA: Diagnosis not present

## 2022-05-14 DIAGNOSIS — Z7901 Long term (current) use of anticoagulants: Secondary | ICD-10-CM | POA: Diagnosis not present

## 2022-05-14 DIAGNOSIS — H35373 Puckering of macula, bilateral: Secondary | ICD-10-CM | POA: Diagnosis not present

## 2022-05-14 DIAGNOSIS — Z87891 Personal history of nicotine dependence: Secondary | ICD-10-CM | POA: Diagnosis not present

## 2022-05-14 DIAGNOSIS — Z7951 Long term (current) use of inhaled steroids: Secondary | ICD-10-CM | POA: Diagnosis not present

## 2022-05-14 DIAGNOSIS — I4892 Unspecified atrial flutter: Secondary | ICD-10-CM | POA: Diagnosis not present

## 2022-05-14 DIAGNOSIS — M21372 Foot drop, left foot: Secondary | ICD-10-CM | POA: Diagnosis not present

## 2022-05-14 DIAGNOSIS — E119 Type 2 diabetes mellitus without complications: Secondary | ICD-10-CM | POA: Diagnosis not present

## 2022-05-14 DIAGNOSIS — Z9181 History of falling: Secondary | ICD-10-CM | POA: Diagnosis not present

## 2022-05-14 DIAGNOSIS — E039 Hypothyroidism, unspecified: Secondary | ICD-10-CM | POA: Diagnosis not present

## 2022-05-14 DIAGNOSIS — J449 Chronic obstructive pulmonary disease, unspecified: Secondary | ICD-10-CM | POA: Diagnosis not present

## 2022-05-14 LAB — HM DIABETES EYE EXAM

## 2022-05-16 ENCOUNTER — Other Ambulatory Visit: Payer: Self-pay | Admitting: Family

## 2022-05-16 DIAGNOSIS — Z9181 History of falling: Secondary | ICD-10-CM | POA: Diagnosis not present

## 2022-05-16 DIAGNOSIS — M21372 Foot drop, left foot: Secondary | ICD-10-CM | POA: Diagnosis not present

## 2022-05-16 DIAGNOSIS — Z7951 Long term (current) use of inhaled steroids: Secondary | ICD-10-CM | POA: Diagnosis not present

## 2022-05-16 DIAGNOSIS — E119 Type 2 diabetes mellitus without complications: Secondary | ICD-10-CM | POA: Diagnosis not present

## 2022-05-16 DIAGNOSIS — E039 Hypothyroidism, unspecified: Secondary | ICD-10-CM | POA: Diagnosis not present

## 2022-05-16 DIAGNOSIS — E785 Hyperlipidemia, unspecified: Secondary | ICD-10-CM | POA: Diagnosis not present

## 2022-05-16 DIAGNOSIS — Z7984 Long term (current) use of oral hypoglycemic drugs: Secondary | ICD-10-CM | POA: Diagnosis not present

## 2022-05-16 DIAGNOSIS — Z7901 Long term (current) use of anticoagulants: Secondary | ICD-10-CM | POA: Diagnosis not present

## 2022-05-16 DIAGNOSIS — Z87891 Personal history of nicotine dependence: Secondary | ICD-10-CM | POA: Diagnosis not present

## 2022-05-16 DIAGNOSIS — I4892 Unspecified atrial flutter: Secondary | ICD-10-CM | POA: Diagnosis not present

## 2022-05-16 DIAGNOSIS — J449 Chronic obstructive pulmonary disease, unspecified: Secondary | ICD-10-CM | POA: Diagnosis not present

## 2022-05-22 DIAGNOSIS — Z87891 Personal history of nicotine dependence: Secondary | ICD-10-CM | POA: Diagnosis not present

## 2022-05-22 DIAGNOSIS — Z7951 Long term (current) use of inhaled steroids: Secondary | ICD-10-CM | POA: Diagnosis not present

## 2022-05-22 DIAGNOSIS — I4892 Unspecified atrial flutter: Secondary | ICD-10-CM | POA: Diagnosis not present

## 2022-05-22 DIAGNOSIS — M21372 Foot drop, left foot: Secondary | ICD-10-CM | POA: Diagnosis not present

## 2022-05-22 DIAGNOSIS — E119 Type 2 diabetes mellitus without complications: Secondary | ICD-10-CM | POA: Diagnosis not present

## 2022-05-22 DIAGNOSIS — J449 Chronic obstructive pulmonary disease, unspecified: Secondary | ICD-10-CM | POA: Diagnosis not present

## 2022-05-22 DIAGNOSIS — Z9181 History of falling: Secondary | ICD-10-CM | POA: Diagnosis not present

## 2022-05-22 DIAGNOSIS — Z7901 Long term (current) use of anticoagulants: Secondary | ICD-10-CM | POA: Diagnosis not present

## 2022-05-22 DIAGNOSIS — Z7984 Long term (current) use of oral hypoglycemic drugs: Secondary | ICD-10-CM | POA: Diagnosis not present

## 2022-05-22 DIAGNOSIS — E039 Hypothyroidism, unspecified: Secondary | ICD-10-CM | POA: Diagnosis not present

## 2022-05-22 DIAGNOSIS — E785 Hyperlipidemia, unspecified: Secondary | ICD-10-CM | POA: Diagnosis not present

## 2022-05-29 DIAGNOSIS — Z9181 History of falling: Secondary | ICD-10-CM | POA: Diagnosis not present

## 2022-05-29 DIAGNOSIS — Z7984 Long term (current) use of oral hypoglycemic drugs: Secondary | ICD-10-CM | POA: Diagnosis not present

## 2022-05-29 DIAGNOSIS — Z7951 Long term (current) use of inhaled steroids: Secondary | ICD-10-CM | POA: Diagnosis not present

## 2022-05-29 DIAGNOSIS — J449 Chronic obstructive pulmonary disease, unspecified: Secondary | ICD-10-CM | POA: Diagnosis not present

## 2022-05-29 DIAGNOSIS — I4892 Unspecified atrial flutter: Secondary | ICD-10-CM | POA: Diagnosis not present

## 2022-05-29 DIAGNOSIS — E039 Hypothyroidism, unspecified: Secondary | ICD-10-CM | POA: Diagnosis not present

## 2022-05-29 DIAGNOSIS — Z7901 Long term (current) use of anticoagulants: Secondary | ICD-10-CM | POA: Diagnosis not present

## 2022-05-29 DIAGNOSIS — M21372 Foot drop, left foot: Secondary | ICD-10-CM | POA: Diagnosis not present

## 2022-05-29 DIAGNOSIS — E785 Hyperlipidemia, unspecified: Secondary | ICD-10-CM | POA: Diagnosis not present

## 2022-05-29 DIAGNOSIS — E119 Type 2 diabetes mellitus without complications: Secondary | ICD-10-CM | POA: Diagnosis not present

## 2022-05-29 DIAGNOSIS — Z87891 Personal history of nicotine dependence: Secondary | ICD-10-CM | POA: Diagnosis not present

## 2022-06-07 DIAGNOSIS — Z87891 Personal history of nicotine dependence: Secondary | ICD-10-CM | POA: Diagnosis not present

## 2022-06-07 DIAGNOSIS — J449 Chronic obstructive pulmonary disease, unspecified: Secondary | ICD-10-CM | POA: Diagnosis not present

## 2022-06-07 DIAGNOSIS — Z7984 Long term (current) use of oral hypoglycemic drugs: Secondary | ICD-10-CM | POA: Diagnosis not present

## 2022-06-07 DIAGNOSIS — I4892 Unspecified atrial flutter: Secondary | ICD-10-CM | POA: Diagnosis not present

## 2022-06-07 DIAGNOSIS — E785 Hyperlipidemia, unspecified: Secondary | ICD-10-CM | POA: Diagnosis not present

## 2022-06-07 DIAGNOSIS — Z7951 Long term (current) use of inhaled steroids: Secondary | ICD-10-CM | POA: Diagnosis not present

## 2022-06-07 DIAGNOSIS — M21372 Foot drop, left foot: Secondary | ICD-10-CM | POA: Diagnosis not present

## 2022-06-07 DIAGNOSIS — E119 Type 2 diabetes mellitus without complications: Secondary | ICD-10-CM | POA: Diagnosis not present

## 2022-06-07 DIAGNOSIS — Z7901 Long term (current) use of anticoagulants: Secondary | ICD-10-CM | POA: Diagnosis not present

## 2022-06-07 DIAGNOSIS — Z9181 History of falling: Secondary | ICD-10-CM | POA: Diagnosis not present

## 2022-06-07 DIAGNOSIS — E039 Hypothyroidism, unspecified: Secondary | ICD-10-CM | POA: Diagnosis not present

## 2022-06-14 DIAGNOSIS — M21372 Foot drop, left foot: Secondary | ICD-10-CM | POA: Diagnosis not present

## 2022-06-14 DIAGNOSIS — E785 Hyperlipidemia, unspecified: Secondary | ICD-10-CM | POA: Diagnosis not present

## 2022-06-14 DIAGNOSIS — Z7901 Long term (current) use of anticoagulants: Secondary | ICD-10-CM | POA: Diagnosis not present

## 2022-06-14 DIAGNOSIS — J449 Chronic obstructive pulmonary disease, unspecified: Secondary | ICD-10-CM | POA: Diagnosis not present

## 2022-06-14 DIAGNOSIS — Z7951 Long term (current) use of inhaled steroids: Secondary | ICD-10-CM | POA: Diagnosis not present

## 2022-06-14 DIAGNOSIS — Z9181 History of falling: Secondary | ICD-10-CM | POA: Diagnosis not present

## 2022-06-14 DIAGNOSIS — E039 Hypothyroidism, unspecified: Secondary | ICD-10-CM | POA: Diagnosis not present

## 2022-06-14 DIAGNOSIS — Z7984 Long term (current) use of oral hypoglycemic drugs: Secondary | ICD-10-CM | POA: Diagnosis not present

## 2022-06-14 DIAGNOSIS — I4892 Unspecified atrial flutter: Secondary | ICD-10-CM | POA: Diagnosis not present

## 2022-06-14 DIAGNOSIS — E119 Type 2 diabetes mellitus without complications: Secondary | ICD-10-CM | POA: Diagnosis not present

## 2022-06-14 DIAGNOSIS — Z87891 Personal history of nicotine dependence: Secondary | ICD-10-CM | POA: Diagnosis not present

## 2022-06-18 ENCOUNTER — Telehealth: Payer: Self-pay | Admitting: Family Medicine

## 2022-06-18 NOTE — Telephone Encounter (Signed)
Copied from [place] [id number]. Topic: Medicare AWV >> Jun 18, 2022 10:45 AM Devoria Glassing wrote: Reason for UOH:[id number] patient to schedule Annual Wellness Visit.  Please schedule with Nurse Health Advisor Denisa O'Brien-Blaney, LPN at [facility]. This appt can be telephone or office visit.  Please call [phone] ask for [facility]

## 2022-06-19 DIAGNOSIS — E039 Hypothyroidism, unspecified: Secondary | ICD-10-CM | POA: Diagnosis not present

## 2022-06-19 DIAGNOSIS — Z7901 Long term (current) use of anticoagulants: Secondary | ICD-10-CM | POA: Diagnosis not present

## 2022-06-19 DIAGNOSIS — E785 Hyperlipidemia, unspecified: Secondary | ICD-10-CM | POA: Diagnosis not present

## 2022-06-19 DIAGNOSIS — I4892 Unspecified atrial flutter: Secondary | ICD-10-CM | POA: Diagnosis not present

## 2022-06-19 DIAGNOSIS — E119 Type 2 diabetes mellitus without complications: Secondary | ICD-10-CM | POA: Diagnosis not present

## 2022-06-19 DIAGNOSIS — M21372 Foot drop, left foot: Secondary | ICD-10-CM | POA: Diagnosis not present

## 2022-06-19 DIAGNOSIS — Z7951 Long term (current) use of inhaled steroids: Secondary | ICD-10-CM | POA: Diagnosis not present

## 2022-06-19 DIAGNOSIS — Z7984 Long term (current) use of oral hypoglycemic drugs: Secondary | ICD-10-CM | POA: Diagnosis not present

## 2022-06-19 DIAGNOSIS — Z87891 Personal history of nicotine dependence: Secondary | ICD-10-CM | POA: Diagnosis not present

## 2022-06-19 DIAGNOSIS — J449 Chronic obstructive pulmonary disease, unspecified: Secondary | ICD-10-CM | POA: Diagnosis not present

## 2022-06-19 DIAGNOSIS — Z9181 History of falling: Secondary | ICD-10-CM | POA: Diagnosis not present

## 2022-06-20 ENCOUNTER — Ambulatory Visit: Payer: Medicare HMO | Admitting: Cardiovascular Disease

## 2022-06-20 ENCOUNTER — Encounter: Payer: Self-pay | Admitting: Cardiovascular Disease

## 2022-06-20 VITALS — BP 120/60 | HR 60 | Ht 68.0 in | Wt 162.2 lb

## 2022-06-20 DIAGNOSIS — E785 Hyperlipidemia, unspecified: Secondary | ICD-10-CM

## 2022-06-20 DIAGNOSIS — I25118 Atherosclerotic heart disease of native coronary artery with other forms of angina pectoris: Secondary | ICD-10-CM | POA: Diagnosis not present

## 2022-06-20 DIAGNOSIS — I10 Essential (primary) hypertension: Secondary | ICD-10-CM

## 2022-06-20 DIAGNOSIS — I4892 Unspecified atrial flutter: Secondary | ICD-10-CM

## 2022-06-20 NOTE — Progress Notes (Signed)
Cardiology Office Note   Date:  06/20/2022   ID:  Devin Reese, Devin Reese [birth year], MRN [id number]  PCP:  Leone Haven, MD  Cardiologist:   Kathlyn Sacramento, MD   Chief Complaint  Patient presents with   Follow-up    6 month follow up,  no new Cardiac concerns      History of Present Illness: Devin Reese is a 88 y.o. male who presents for a followup visit regarding coronary artery disease and atrial flutter. He has known history of coronary artery disease status post CABG in 1991 at [facility]. He was diagnosed with atrial flutter in 2014 with subsequent successful cardioversion. No recurrent arrhythmia since then.  Most recent echocardiogram in 2018 showed mildly reduced LV systolic function with an EF of 40 to 45% with inferior wall hypokinesis, mild mitral and aortic regurgitation. Carvedilol was discontinued recently due to bradycardia.  Lexiscan Myoview in August 2018 showed prior inferior scar with no significant ischemia.  He has chronic kidney disease followed by Dr. Candiss Norse.  He had recurrent falls over the last 6 months most recently in June.  No significant injuries.  He is currently attending physical therapy for balance and seems to be improving.  No chest pain or shortness of breath.  No palpitations.   Past Medical History:  Diagnosis Date   Atrial flutter ([place])    CAD (coronary artery disease)    CKD (chronic kidney disease), stage II    COPD (chronic obstructive pulmonary disease) ([place])    Coronary artery disease    CABG in 1991 at [facility]. Most recent cardiac catheterization in 4650 was complicated by stroke.   Diabetes mellitus without complication ([place])    Hyperlipidemia    Hypertension    Prostate cancer ([place])    Stroke [facility])     Past Surgical History:  Procedure Laterality Date   CARDIAC CATHETERIZATION     CARDIAC SURGERY     CARDIOVERSION  03/29/13   CORONARY ARTERY BYPASS GRAFT       Current Outpatient Medications  Medication Sig  Dispense Refill   albuterol (VENTOLIN HFA) 108 (90 Base) MCG/ACT inhaler Inhale 1-2 puffs into the lungs every 4 (four) hours as needed for wheezing or shortness of breath. 1 each 0   apixaban (ELIQUIS) 2.5 MG TABS tablet Take 1 tablet (2.5 mg total) by mouth 2 (two) times daily. 60 tablet 6   atorvastatin (LIPITOR) 40 MG tablet Take 1 tablet (40 mg total) by mouth daily. 90 tablet 0   bisacodyl (DULCOLAX) 5 MG EC tablet Take 5 mg by mouth daily as needed.     Fluticasone-Umeclidin-Vilant (TRELEGY ELLIPTA IN) Inhale 1 puff into the lungs daily.     levothyroxine (SYNTHROID) 25 MCG tablet Take 1 tablet (25 mcg total) by mouth daily before breakfast. 90 tablet 0   linagliptin (TRADJENTA) 5 MG TABS tablet Take 1 tablet (5 mg total) by mouth daily. 90 tablet 1   nitroGLYCERIN (NITROSTAT) 0.4 MG SL tablet Place 1 tablet (0.4 mg total) under the tongue every 5 (five) minutes as needed for chest pain. 25 tablet 3   No current facility-administered medications for this visit.    Allergies:   Patient has no known allergies.    Social History:  The patient  reports that he has quit smoking. His smoking use included cigarettes. He has a 25.00 pack-year smoking history. He has never used smokeless tobacco. He reports that he does not drink alcohol and does not use  drugs.   Family History:  The patient's family history includes Cancer in an other family member; Coronary artery disease in his father and another family member; Diabetes in his brother; Heart attack in his brother; Heart disease in his father.    ROS:  Please see the history of present illness.   Otherwise, review of systems are positive for none.   All other systems are reviewed and negative.    PHYSICAL EXAM: VS:  BP 120/60 (BP Location: Left Arm, Patient Position: Sitting, Cuff Size: Normal)   Pulse 60   Ht '5\' 8"'$  (1.727 m)   Wt 162 lb 3.2 oz (73.6 kg)   SpO2 95%   BMI 24.66 kg/m  , BMI Body mass index is 24.66 kg/m. GEN: Well  nourished, well developed, in no acute distress  HEENT: normal  Neck: no JVD, carotid bruits, or masses Cardiac: RRR with premature beats; no rubs, or gallops,no edema .  1 /6 systolic murmur in the aortic area. Respiratory:  clear to auscultation bilaterally, normal work of breathing GI: soft, nontender, nondistended, + BS MS: no deformity or atrophy  Skin: warm and dry, no rash Neuro:  Strength and sensation are intact Psych: euthymic mood, full affect   EKG:  EKG is ordered today. The ekg ordered today demonstrates sinus rhythm with first-degree AV block.    Recent Labs: 11/23/2021: Hemoglobin 12.4; Platelets 157 05/01/2022: ALT 7; BUN 27; Creatinine, Ser 1.76; Potassium 4.4; Sodium 139; TSH 2.82    Lipid Panel    Component Value Date/Time   CHOL 130 05/01/2022 1203   CHOL 143 03/20/2017 0826   TRIG 80.0 05/01/2022 1203   HDL 48.70 05/01/2022 1203   HDL 40 03/20/2017 0826   CHOLHDL 3 05/01/2022 1203   VLDL 16.0 05/01/2022 1203   LDLCALC 65 05/01/2022 1203   LDLCALC 84 03/20/2017 0826      Wt Readings from Last 3 Encounters:  06/20/22 162 lb 3.2 oz (73.6 kg)  05/01/22 148 lb 6.4 oz (67.3 kg)  04/26/22 185 lb (83.9 kg)         ASSESSMENT AND PLAN:  1.  Paroxysmal atrial flutter: No evidence of recurrent arrhythmia and heart rate seems to be controlled. Continue long-term anticoagulation with low-dose Eliquis given age and chronic kidney disease.  I reviewed his recent labs that were done in June showed stable renal function with a creatinine of 1.76. We will monitor the frequency of his falls and consider stopping anticoagulation.  For now, he seems to be improving with physical therapy.   2. Coronary artery disease involving native coronary arteries with stable angina:  He reports no anginal symptoms at the present time.   He is not on a beta-blocker due to chronic bradycardia.   3. Essential hypertension: Blood pressure is controlled without medications.   4.  Hyperlipidemia: I reviewed his recent lipid profile which showed an LDL of 65 which is at target.  Continue atorvastatin.    Disposition:   FU with me in 6 months  Signed,  Kathlyn Sacramento, MD  06/20/2022 3:52 PM    [place]

## 2022-06-20 NOTE — Patient Instructions (Signed)

## 2022-06-26 DIAGNOSIS — E039 Hypothyroidism, unspecified: Secondary | ICD-10-CM | POA: Diagnosis not present

## 2022-06-26 DIAGNOSIS — E785 Hyperlipidemia, unspecified: Secondary | ICD-10-CM | POA: Diagnosis not present

## 2022-06-26 DIAGNOSIS — E119 Type 2 diabetes mellitus without complications: Secondary | ICD-10-CM | POA: Diagnosis not present

## 2022-06-26 DIAGNOSIS — Z7951 Long term (current) use of inhaled steroids: Secondary | ICD-10-CM | POA: Diagnosis not present

## 2022-06-26 DIAGNOSIS — Z7984 Long term (current) use of oral hypoglycemic drugs: Secondary | ICD-10-CM | POA: Diagnosis not present

## 2022-06-26 DIAGNOSIS — Z7901 Long term (current) use of anticoagulants: Secondary | ICD-10-CM | POA: Diagnosis not present

## 2022-06-26 DIAGNOSIS — Z9181 History of falling: Secondary | ICD-10-CM | POA: Diagnosis not present

## 2022-06-26 DIAGNOSIS — J449 Chronic obstructive pulmonary disease, unspecified: Secondary | ICD-10-CM | POA: Diagnosis not present

## 2022-06-26 DIAGNOSIS — Z87891 Personal history of nicotine dependence: Secondary | ICD-10-CM | POA: Diagnosis not present

## 2022-06-26 DIAGNOSIS — M21372 Foot drop, left foot: Secondary | ICD-10-CM | POA: Diagnosis not present

## 2022-06-26 DIAGNOSIS — I4892 Unspecified atrial flutter: Secondary | ICD-10-CM | POA: Diagnosis not present

## 2022-07-01 DIAGNOSIS — M21372 Foot drop, left foot: Secondary | ICD-10-CM | POA: Diagnosis not present

## 2022-07-01 DIAGNOSIS — E785 Hyperlipidemia, unspecified: Secondary | ICD-10-CM | POA: Diagnosis not present

## 2022-07-01 DIAGNOSIS — Z7984 Long term (current) use of oral hypoglycemic drugs: Secondary | ICD-10-CM | POA: Diagnosis not present

## 2022-07-01 DIAGNOSIS — E119 Type 2 diabetes mellitus without complications: Secondary | ICD-10-CM | POA: Diagnosis not present

## 2022-07-01 DIAGNOSIS — J449 Chronic obstructive pulmonary disease, unspecified: Secondary | ICD-10-CM | POA: Diagnosis not present

## 2022-07-01 DIAGNOSIS — E039 Hypothyroidism, unspecified: Secondary | ICD-10-CM | POA: Diagnosis not present

## 2022-07-01 DIAGNOSIS — I4892 Unspecified atrial flutter: Secondary | ICD-10-CM | POA: Diagnosis not present

## 2022-07-01 DIAGNOSIS — Z87891 Personal history of nicotine dependence: Secondary | ICD-10-CM | POA: Diagnosis not present

## 2022-07-01 DIAGNOSIS — Z7901 Long term (current) use of anticoagulants: Secondary | ICD-10-CM | POA: Diagnosis not present

## 2022-07-01 DIAGNOSIS — Z9181 History of falling: Secondary | ICD-10-CM | POA: Diagnosis not present

## 2022-07-01 DIAGNOSIS — Z7951 Long term (current) use of inhaled steroids: Secondary | ICD-10-CM | POA: Diagnosis not present

## 2022-07-04 DIAGNOSIS — C44612 Basal cell carcinoma of skin of right upper limb, including shoulder: Secondary | ICD-10-CM | POA: Diagnosis not present

## 2022-07-04 DIAGNOSIS — D2361 Other benign neoplasm of skin of right upper limb, including shoulder: Secondary | ICD-10-CM | POA: Diagnosis not present

## 2022-07-12 DIAGNOSIS — Z7951 Long term (current) use of inhaled steroids: Secondary | ICD-10-CM | POA: Diagnosis not present

## 2022-07-12 DIAGNOSIS — J449 Chronic obstructive pulmonary disease, unspecified: Secondary | ICD-10-CM | POA: Diagnosis not present

## 2022-07-12 DIAGNOSIS — I4892 Unspecified atrial flutter: Secondary | ICD-10-CM | POA: Diagnosis not present

## 2022-07-12 DIAGNOSIS — E039 Hypothyroidism, unspecified: Secondary | ICD-10-CM | POA: Diagnosis not present

## 2022-07-12 DIAGNOSIS — Z9181 History of falling: Secondary | ICD-10-CM | POA: Diagnosis not present

## 2022-07-12 DIAGNOSIS — E785 Hyperlipidemia, unspecified: Secondary | ICD-10-CM | POA: Diagnosis not present

## 2022-07-12 DIAGNOSIS — M21372 Foot drop, left foot: Secondary | ICD-10-CM | POA: Diagnosis not present

## 2022-07-12 DIAGNOSIS — Z87891 Personal history of nicotine dependence: Secondary | ICD-10-CM | POA: Diagnosis not present

## 2022-07-12 DIAGNOSIS — E119 Type 2 diabetes mellitus without complications: Secondary | ICD-10-CM | POA: Diagnosis not present

## 2022-07-12 DIAGNOSIS — Z7984 Long term (current) use of oral hypoglycemic drugs: Secondary | ICD-10-CM | POA: Diagnosis not present

## 2022-07-12 DIAGNOSIS — Z7901 Long term (current) use of anticoagulants: Secondary | ICD-10-CM | POA: Diagnosis not present

## 2022-07-16 DIAGNOSIS — Z7901 Long term (current) use of anticoagulants: Secondary | ICD-10-CM | POA: Diagnosis not present

## 2022-07-16 DIAGNOSIS — I4892 Unspecified atrial flutter: Secondary | ICD-10-CM | POA: Diagnosis not present

## 2022-07-16 DIAGNOSIS — M21372 Foot drop, left foot: Secondary | ICD-10-CM | POA: Diagnosis not present

## 2022-07-16 DIAGNOSIS — Z9181 History of falling: Secondary | ICD-10-CM | POA: Diagnosis not present

## 2022-07-16 DIAGNOSIS — Z7951 Long term (current) use of inhaled steroids: Secondary | ICD-10-CM | POA: Diagnosis not present

## 2022-07-16 DIAGNOSIS — Z87891 Personal history of nicotine dependence: Secondary | ICD-10-CM | POA: Diagnosis not present

## 2022-07-16 DIAGNOSIS — Z7984 Long term (current) use of oral hypoglycemic drugs: Secondary | ICD-10-CM | POA: Diagnosis not present

## 2022-07-16 DIAGNOSIS — E039 Hypothyroidism, unspecified: Secondary | ICD-10-CM | POA: Diagnosis not present

## 2022-07-16 DIAGNOSIS — E119 Type 2 diabetes mellitus without complications: Secondary | ICD-10-CM | POA: Diagnosis not present

## 2022-07-16 DIAGNOSIS — E785 Hyperlipidemia, unspecified: Secondary | ICD-10-CM | POA: Diagnosis not present

## 2022-07-16 DIAGNOSIS — J449 Chronic obstructive pulmonary disease, unspecified: Secondary | ICD-10-CM | POA: Diagnosis not present

## 2022-07-25 DIAGNOSIS — M21372 Foot drop, left foot: Secondary | ICD-10-CM | POA: Diagnosis not present

## 2022-07-25 DIAGNOSIS — Z9181 History of falling: Secondary | ICD-10-CM | POA: Diagnosis not present

## 2022-07-25 DIAGNOSIS — J449 Chronic obstructive pulmonary disease, unspecified: Secondary | ICD-10-CM | POA: Diagnosis not present

## 2022-07-25 DIAGNOSIS — Z7984 Long term (current) use of oral hypoglycemic drugs: Secondary | ICD-10-CM | POA: Diagnosis not present

## 2022-07-25 DIAGNOSIS — E785 Hyperlipidemia, unspecified: Secondary | ICD-10-CM | POA: Diagnosis not present

## 2022-07-25 DIAGNOSIS — Z7901 Long term (current) use of anticoagulants: Secondary | ICD-10-CM | POA: Diagnosis not present

## 2022-07-25 DIAGNOSIS — E119 Type 2 diabetes mellitus without complications: Secondary | ICD-10-CM | POA: Diagnosis not present

## 2022-07-25 DIAGNOSIS — I4892 Unspecified atrial flutter: Secondary | ICD-10-CM | POA: Diagnosis not present

## 2022-07-25 DIAGNOSIS — E039 Hypothyroidism, unspecified: Secondary | ICD-10-CM | POA: Diagnosis not present

## 2022-07-25 DIAGNOSIS — Z7951 Long term (current) use of inhaled steroids: Secondary | ICD-10-CM | POA: Diagnosis not present

## 2022-07-25 DIAGNOSIS — Z87891 Personal history of nicotine dependence: Secondary | ICD-10-CM | POA: Diagnosis not present

## 2022-07-26 DIAGNOSIS — M62830 Muscle spasm of back: Secondary | ICD-10-CM | POA: Diagnosis not present

## 2022-07-26 DIAGNOSIS — M5136 Other intervertebral disc degeneration, lumbar region: Secondary | ICD-10-CM | POA: Diagnosis not present

## 2022-07-26 DIAGNOSIS — M21372 Foot drop, left foot: Secondary | ICD-10-CM | POA: Diagnosis not present

## 2022-07-26 DIAGNOSIS — M5416 Radiculopathy, lumbar region: Secondary | ICD-10-CM | POA: Diagnosis not present

## 2022-07-26 DIAGNOSIS — M48062 Spinal stenosis, lumbar region with neurogenic claudication: Secondary | ICD-10-CM | POA: Diagnosis not present

## 2022-07-26 DIAGNOSIS — M545 Low back pain, unspecified: Secondary | ICD-10-CM | POA: Diagnosis not present

## 2022-07-29 DIAGNOSIS — I4892 Unspecified atrial flutter: Secondary | ICD-10-CM | POA: Diagnosis not present

## 2022-07-29 DIAGNOSIS — E785 Hyperlipidemia, unspecified: Secondary | ICD-10-CM | POA: Diagnosis not present

## 2022-07-29 DIAGNOSIS — E039 Hypothyroidism, unspecified: Secondary | ICD-10-CM | POA: Diagnosis not present

## 2022-07-29 DIAGNOSIS — Z9181 History of falling: Secondary | ICD-10-CM | POA: Diagnosis not present

## 2022-07-29 DIAGNOSIS — Z7901 Long term (current) use of anticoagulants: Secondary | ICD-10-CM | POA: Diagnosis not present

## 2022-07-29 DIAGNOSIS — E119 Type 2 diabetes mellitus without complications: Secondary | ICD-10-CM | POA: Diagnosis not present

## 2022-07-29 DIAGNOSIS — Z87891 Personal history of nicotine dependence: Secondary | ICD-10-CM | POA: Diagnosis not present

## 2022-07-29 DIAGNOSIS — J449 Chronic obstructive pulmonary disease, unspecified: Secondary | ICD-10-CM | POA: Diagnosis not present

## 2022-07-29 DIAGNOSIS — M21372 Foot drop, left foot: Secondary | ICD-10-CM | POA: Diagnosis not present

## 2022-07-29 DIAGNOSIS — Z7984 Long term (current) use of oral hypoglycemic drugs: Secondary | ICD-10-CM | POA: Diagnosis not present

## 2022-07-29 DIAGNOSIS — Z7951 Long term (current) use of inhaled steroids: Secondary | ICD-10-CM | POA: Diagnosis not present

## 2022-08-06 DIAGNOSIS — Z87891 Personal history of nicotine dependence: Secondary | ICD-10-CM | POA: Diagnosis not present

## 2022-08-06 DIAGNOSIS — Z9181 History of falling: Secondary | ICD-10-CM | POA: Diagnosis not present

## 2022-08-06 DIAGNOSIS — M21372 Foot drop, left foot: Secondary | ICD-10-CM | POA: Diagnosis not present

## 2022-08-06 DIAGNOSIS — Z7901 Long term (current) use of anticoagulants: Secondary | ICD-10-CM | POA: Diagnosis not present

## 2022-08-06 DIAGNOSIS — E785 Hyperlipidemia, unspecified: Secondary | ICD-10-CM | POA: Diagnosis not present

## 2022-08-06 DIAGNOSIS — Z7984 Long term (current) use of oral hypoglycemic drugs: Secondary | ICD-10-CM | POA: Diagnosis not present

## 2022-08-06 DIAGNOSIS — J449 Chronic obstructive pulmonary disease, unspecified: Secondary | ICD-10-CM | POA: Diagnosis not present

## 2022-08-06 DIAGNOSIS — I4892 Unspecified atrial flutter: Secondary | ICD-10-CM | POA: Diagnosis not present

## 2022-08-06 DIAGNOSIS — E119 Type 2 diabetes mellitus without complications: Secondary | ICD-10-CM | POA: Diagnosis not present

## 2022-08-06 DIAGNOSIS — Z7951 Long term (current) use of inhaled steroids: Secondary | ICD-10-CM | POA: Diagnosis not present

## 2022-08-06 DIAGNOSIS — E039 Hypothyroidism, unspecified: Secondary | ICD-10-CM | POA: Diagnosis not present

## 2022-08-07 ENCOUNTER — Ambulatory Visit (INDEPENDENT_AMBULATORY_CARE_PROVIDER_SITE_OTHER): Payer: Medicare HMO | Admitting: Family Medicine

## 2022-08-07 ENCOUNTER — Encounter: Payer: Self-pay | Admitting: Family Medicine

## 2022-08-07 VITALS — BP 120/70 | HR 64 | Temp 97.7°F | Ht 68.0 in | Wt 160.0 lb

## 2022-08-07 DIAGNOSIS — M21372 Foot drop, left foot: Secondary | ICD-10-CM

## 2022-08-07 DIAGNOSIS — Z23 Encounter for immunization: Secondary | ICD-10-CM

## 2022-08-07 DIAGNOSIS — E119 Type 2 diabetes mellitus without complications: Secondary | ICD-10-CM

## 2022-08-07 DIAGNOSIS — I4892 Unspecified atrial flutter: Secondary | ICD-10-CM

## 2022-08-07 LAB — POCT GLYCOSYLATED HEMOGLOBIN (HGB A1C): Hemoglobin A1C: 6.7 % — AB (ref 4.0–5.6)

## 2022-08-07 NOTE — Assessment & Plan Note (Signed)
Sinus rhythm today.  He will continue Eliquis 2.5 mg twice daily.

## 2022-08-07 NOTE — Assessment & Plan Note (Signed)
Check A1c.  Continue Tradjenta 5 mg daily.

## 2022-08-07 NOTE — Patient Instructions (Signed)
Nice to see you!   

## 2022-08-07 NOTE — Progress Notes (Signed)
[name], MD Phone: [phone]  Devin Reese is a 88 y.o. male who presents today for follow-up.  Atrial flutter: Patient is on Eliquis.  No palpitations, chest pain, or bleeding.  Diabetes: Patient's wife notes that sugar was 143 this morning.  He takes Cytogeneticist.  Chronic back pain and left foot drop: Patient continues with physical therapy.  He had a back injection that did help some.  He wears an AFO.  No recent falls.  He walks with his walker all the time.  Social History   Tobacco Use  Smoking Status Former   Packs/day: 1.00   Years: 25.00   Total pack years: 25.00   Types: Cigarettes  Smokeless Tobacco Never    Current Outpatient Medications on File Prior to Visit  Medication Sig Dispense Refill   albuterol (VENTOLIN HFA) 108 (90 Base) MCG/ACT inhaler Inhale 1-2 puffs into the lungs every 4 (four) hours as needed for wheezing or shortness of breath. 1 each 0   apixaban (ELIQUIS) 2.5 MG TABS tablet Take 1 tablet (2.5 mg total) by mouth 2 (two) times daily. 60 tablet 6   bisacodyl (DULCOLAX) 5 MG EC tablet Take 5 mg by mouth daily as needed.     Fluticasone-Umeclidin-Vilant (TRELEGY ELLIPTA IN) Inhale 1 puff into the lungs daily.     levothyroxine (SYNTHROID) 25 MCG tablet Take 1 tablet (25 mcg total) by mouth daily before breakfast. 90 tablet 0   linagliptin (TRADJENTA) 5 MG TABS tablet Take 1 tablet (5 mg total) by mouth daily. 90 tablet 1   nitroGLYCERIN (NITROSTAT) 0.4 MG SL tablet Place 1 tablet (0.4 mg total) under the tongue every 5 (five) minutes as needed for chest pain. 25 tablet 3   atorvastatin (LIPITOR) 40 MG tablet Take 1 tablet (40 mg total) by mouth daily. 90 tablet 0   No current facility-administered medications on file prior to visit.     ROS see history of present illness  Objective  Physical Exam Vitals:   08/07/22 1605  BP: 120/70  Pulse: 64  Temp: 97.7 F (36.5 C)  SpO2: 98%    BP Readings from Last 3 Encounters:   08/07/22 120/70  06/20/22 120/60  05/01/22 118/80   Wt Readings from Last 3 Encounters:  08/07/22 160 lb (72.6 kg)  06/20/22 162 lb 3.2 oz (73.6 kg)  05/01/22 148 lb 6.4 oz (67.3 kg)    Physical Exam Constitutional:      General: He is not in acute distress.    Appearance: He is not diaphoretic.  Cardiovascular:     Rate and Rhythm: Normal rate and regular rhythm.     Heart sounds: Normal heart sounds.  Pulmonary:     Effort: Pulmonary effort is normal.     Breath sounds: Normal breath sounds.  Skin:    General: Skin is warm and dry.  Neurological:     Mental Status: He is alert.      Assessment/Plan: Please see individual problem list.  Problem List Items Addressed This Visit     Atrial flutter ([place]) - Primary (Chronic)    Sinus rhythm today.  He will continue Eliquis 2.5 mg twice daily.      Diabetes ([facility]) (Chronic)    Check A1c.  Continue Tradjenta 5 mg daily.      Relevant Orders   POCT HgB A1C   Left foot drop (Chronic)    He will continue PT and to wear his AFO.  He will continue to follow with his  specialist for his injections.      Other Visit Diagnoses     Need for immunization against influenza       Relevant Orders   Flu Vaccine QUAD High Dose(Fluad) (Completed)        Return in about 6 months (around 02/05/2023) for Diabetes.   [name], MD [place]

## 2022-08-07 NOTE — Assessment & Plan Note (Signed)
He will continue PT and to wear his AFO.  He will continue to follow with his specialist for his injections.

## 2022-08-13 DIAGNOSIS — E119 Type 2 diabetes mellitus without complications: Secondary | ICD-10-CM | POA: Diagnosis not present

## 2022-08-13 DIAGNOSIS — Z7984 Long term (current) use of oral hypoglycemic drugs: Secondary | ICD-10-CM | POA: Diagnosis not present

## 2022-08-13 DIAGNOSIS — Z87891 Personal history of nicotine dependence: Secondary | ICD-10-CM | POA: Diagnosis not present

## 2022-08-13 DIAGNOSIS — Z9181 History of falling: Secondary | ICD-10-CM | POA: Diagnosis not present

## 2022-08-13 DIAGNOSIS — Z7901 Long term (current) use of anticoagulants: Secondary | ICD-10-CM | POA: Diagnosis not present

## 2022-08-13 DIAGNOSIS — E785 Hyperlipidemia, unspecified: Secondary | ICD-10-CM | POA: Diagnosis not present

## 2022-08-13 DIAGNOSIS — J449 Chronic obstructive pulmonary disease, unspecified: Secondary | ICD-10-CM | POA: Diagnosis not present

## 2022-08-13 DIAGNOSIS — Z7951 Long term (current) use of inhaled steroids: Secondary | ICD-10-CM | POA: Diagnosis not present

## 2022-08-13 DIAGNOSIS — M21372 Foot drop, left foot: Secondary | ICD-10-CM | POA: Diagnosis not present

## 2022-08-13 DIAGNOSIS — E039 Hypothyroidism, unspecified: Secondary | ICD-10-CM | POA: Diagnosis not present

## 2022-08-13 DIAGNOSIS — I4892 Unspecified atrial flutter: Secondary | ICD-10-CM | POA: Diagnosis not present

## 2022-08-16 DIAGNOSIS — M48062 Spinal stenosis, lumbar region with neurogenic claudication: Secondary | ICD-10-CM | POA: Diagnosis not present

## 2022-08-16 DIAGNOSIS — M5416 Radiculopathy, lumbar region: Secondary | ICD-10-CM | POA: Diagnosis not present

## 2022-08-21 DIAGNOSIS — J449 Chronic obstructive pulmonary disease, unspecified: Secondary | ICD-10-CM | POA: Diagnosis not present

## 2022-08-21 DIAGNOSIS — Z7901 Long term (current) use of anticoagulants: Secondary | ICD-10-CM | POA: Diagnosis not present

## 2022-08-21 DIAGNOSIS — E785 Hyperlipidemia, unspecified: Secondary | ICD-10-CM | POA: Diagnosis not present

## 2022-08-21 DIAGNOSIS — Z7951 Long term (current) use of inhaled steroids: Secondary | ICD-10-CM | POA: Diagnosis not present

## 2022-08-21 DIAGNOSIS — E119 Type 2 diabetes mellitus without complications: Secondary | ICD-10-CM | POA: Diagnosis not present

## 2022-08-21 DIAGNOSIS — Z87891 Personal history of nicotine dependence: Secondary | ICD-10-CM | POA: Diagnosis not present

## 2022-08-21 DIAGNOSIS — M21372 Foot drop, left foot: Secondary | ICD-10-CM | POA: Diagnosis not present

## 2022-08-21 DIAGNOSIS — Z7984 Long term (current) use of oral hypoglycemic drugs: Secondary | ICD-10-CM | POA: Diagnosis not present

## 2022-08-21 DIAGNOSIS — E039 Hypothyroidism, unspecified: Secondary | ICD-10-CM | POA: Diagnosis not present

## 2022-08-21 DIAGNOSIS — Z9181 History of falling: Secondary | ICD-10-CM | POA: Diagnosis not present

## 2022-08-21 DIAGNOSIS — I4892 Unspecified atrial flutter: Secondary | ICD-10-CM | POA: Diagnosis not present

## 2022-08-26 ENCOUNTER — Other Ambulatory Visit: Payer: Self-pay

## 2022-08-26 ENCOUNTER — Ambulatory Visit
Admission: RE | Admit: 2022-08-26 | Discharge: 2022-08-26 | Disposition: A | Discharge status: Home / Self Care | Payer: Self-pay | Source: Ambulatory Visit | Attending: Neurosurgery | Admitting: Neurosurgery

## 2022-08-26 DIAGNOSIS — Z049 Encounter for examination and observation for unspecified reason: Secondary | ICD-10-CM

## 2022-08-29 DIAGNOSIS — Z7984 Long term (current) use of oral hypoglycemic drugs: Secondary | ICD-10-CM | POA: Diagnosis not present

## 2022-08-29 DIAGNOSIS — E039 Hypothyroidism, unspecified: Secondary | ICD-10-CM | POA: Diagnosis not present

## 2022-08-29 DIAGNOSIS — Z7901 Long term (current) use of anticoagulants: Secondary | ICD-10-CM | POA: Diagnosis not present

## 2022-08-29 DIAGNOSIS — M21372 Foot drop, left foot: Secondary | ICD-10-CM | POA: Diagnosis not present

## 2022-08-29 DIAGNOSIS — I4892 Unspecified atrial flutter: Secondary | ICD-10-CM | POA: Diagnosis not present

## 2022-08-29 DIAGNOSIS — E785 Hyperlipidemia, unspecified: Secondary | ICD-10-CM | POA: Diagnosis not present

## 2022-08-29 DIAGNOSIS — Z7951 Long term (current) use of inhaled steroids: Secondary | ICD-10-CM | POA: Diagnosis not present

## 2022-08-29 DIAGNOSIS — J449 Chronic obstructive pulmonary disease, unspecified: Secondary | ICD-10-CM | POA: Diagnosis not present

## 2022-08-29 DIAGNOSIS — Z87891 Personal history of nicotine dependence: Secondary | ICD-10-CM | POA: Diagnosis not present

## 2022-08-29 DIAGNOSIS — Z9181 History of falling: Secondary | ICD-10-CM | POA: Diagnosis not present

## 2022-08-29 DIAGNOSIS — E119 Type 2 diabetes mellitus without complications: Secondary | ICD-10-CM | POA: Diagnosis not present

## 2022-09-03 ENCOUNTER — Telehealth: Payer: Self-pay

## 2022-09-03 NOTE — Telephone Encounter (Signed)
Maggie states she is calling from [facility] to see if we have received the faxed request for home health physical therapy orders, once a week for nine weeks.  Burman Nieves states it was faxed on 08/30/2022.  Maggie states they cannot see patient again until they have this back.  Please fax to:  [phone].

## 2022-09-03 NOTE — Telephone Encounter (Signed)
I called Maggie and LVM informing her to resend the order she is speaking of because I don't have it and neither does the provider.  Yarielis Funaro,cma

## 2022-09-04 NOTE — Telephone Encounter (Signed)
LVM for patient to call back.   Mantaj Chamberlin,cma  

## 2022-09-04 NOTE — Progress Notes (Unsigned)
Referring Physician:  Harvest Dark, FNP [place],  [place] [address]  Primary Physician:  Leone Haven, MD  History of Present Illness: 09/04/2022 [name] is here today with a chief complaint of buttock pain that has resolved.  He also has a left foot drop.  He is much better since he had an injection.  He has had a few falls this year and one last year that resulted in a right shoulder injury.  He was not felt to be an appropriate candidate for surgical intervention at the time.   Bowel/Bladder Dysfunction: none  Conservative measures:  Physical therapy:  has participated in home health PT through [facility] from 07/07/22 until early October 2023. Multimodal medical therapy including regular antiinflammatories:  tylenol, prednisone taper Injections:  has received epidural steroid injections 08/16/22: right S1 TFESI by Dr Sharlet Salina (99% relief) 07/26/22: right L5 paraspinal musculature trigger point injection by Allene Dillon, NP  Past Surgery: denies  [name] has no symptoms of cervical myelopathy.  The symptoms are causing a significant impact on the patient's life.   Review of Systems:  A 10 point review of systems is negative, except for the pertinent positives and negatives detailed in the HPI.  Past Medical History: Past Medical History:  Diagnosis Date   Atrial flutter ([place])    CAD (coronary artery disease)    CKD (chronic kidney disease), stage II    COPD (chronic obstructive pulmonary disease) ([facility])    Coronary artery disease    CABG in 1991 at [facility]. Most recent cardiac catheterization in 1660 was complicated by stroke.   Diabetes mellitus without complication ([place])    Hyperlipidemia    Hypertension    Prostate cancer ([place])    Stroke [facility])     Past Surgical History: Past Surgical History:  Procedure Laterality Date   CARDIAC CATHETERIZATION     CARDIAC SURGERY     CARDIOVERSION  03/29/13   CORONARY ARTERY  BYPASS GRAFT      Allergies: Allergies as of 09/05/2022   (No Known Allergies)    Medications: No outpatient medications have been marked as taking for the 09/05/22 encounter (Appointment) with Meade Maw, MD.    Social History: Social History   Tobacco Use   Smoking status: Former    Packs/day: 1.00    Years: 25.00    Total pack years: 25.00    Types: Cigarettes   Smokeless tobacco: Never  Vaping Use   Vaping Use: Never used  Substance Use Topics   Alcohol use: No    Comment: Rare   Drug use: No    Family Medical History: Family History  Problem Relation Age of Onset   Heart disease Father    Coronary artery disease Father    Cancer Other        family hx   Coronary artery disease Other        family hx    Diabetes Brother    Heart attack Brother     Physical Examination: There were no vitals filed for this visit.  General: Patient is well developed, well nourished, calm, collected, and in no apparent distress. Attention to examination is appropriate.  Neck:   Supple.  Full range of motion.  Respiratory: Patient is breathing without any difficulty.   NEUROLOGICAL:     Awake, alert, oriented to person, place, and time.  Speech is clear and fluent. Fund of knowledge is appropriate.   Cranial Nerves: Pupils  equal round and reactive to light.  Facial tone is symmetric.  Facial sensation is symmetric. Shoulder shrug is symmetric. Tongue protrusion is midline.  There is no pronator drift.  ROM of spine: full.    Strength: Side Biceps Triceps Deltoid Interossei Grip Wrist Ext. Wrist Flex.  R '5 5 5 5 5 5 5  '$ L '5 5 5 5 5 5 5   '$ Side Iliopsoas Quads Hamstring PF DF EHL  R '5 5 5 5 5 5  '$ L '5 5 5 3 1 1   '$ Reflexes are 1+ and symmetric at the biceps, triceps, brachioradialis, patella and achilles.   Hoffman's is absent.   Bilateral upper and lower extremity sensation is intact to light touch.    No evidence of dysmetria noted.  Gait is normal.      Medical Decision Making  Imaging: MRI L spine 01/01/22 IMPRESSION: 1. Lumbar spine degeneration with scoliosis and multilevel listhesis. Listhesis is greatest at L5-S1 due to chronic bilateral L5 pars defects. 2. Right foraminal impingement at T12-L1 to L2-3 and L5-S1. 3. Left foraminal impingement at L4-5 and L5-S1. 4. Diffusely patent spinal canal.     Electronically Signed   By: Jorje Guild M.D.   On: 01/01/2022 12:04  I have personally reviewed the images and agree with the above interpretation.  Assessment and Plan: Mr. [name] is a pleasant 88 y.o. male with anterolisthesis of L5 on S1 due to pars defects.  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  He did have symptoms of a right L5 radiculopathy, but it is better with injections.  I do not think he is an appropriate candidate to consider L5-S1 instrumented fusion.  That would be the only surgical consideration for decompression of his nerve roots.  I think he should focus on physical therapy, exercises, and injections as needed.  He could also continue with acetaminophen or ibuprofen if he is [place] able to take those medications.  He would like to be referred to the podiatry clinic.  I offered to do so, but his daughter will contact his primary care provider.  I will see him back on an as-needed basis.  I think these should continue with Barnie Del and [employer].   I spent a total of 30 minutes in face-to-face and non-face-to-face activities related to this patient's care today.  Thank you for involving me in the care of this patient.      Tolulope Pinkett K. Izora Ribas MD, [facility]

## 2022-09-05 ENCOUNTER — Ambulatory Visit: Payer: Medicare HMO | Admitting: Neurosurgery

## 2022-09-05 ENCOUNTER — Encounter: Payer: Self-pay | Admitting: Neurosurgery

## 2022-09-05 VITALS — BP 121/63 | HR 61 | Ht 66.0 in | Wt 159.4 lb

## 2022-09-05 DIAGNOSIS — M5416 Radiculopathy, lumbar region: Secondary | ICD-10-CM

## 2022-09-05 DIAGNOSIS — M4310 Spondylolisthesis, site unspecified: Secondary | ICD-10-CM | POA: Diagnosis not present

## 2022-09-05 DIAGNOSIS — M21372 Foot drop, left foot: Secondary | ICD-10-CM | POA: Diagnosis not present

## 2022-09-05 DIAGNOSIS — M4306 Spondylolysis, lumbar region: Secondary | ICD-10-CM | POA: Diagnosis not present

## 2022-09-06 NOTE — Telephone Encounter (Signed)
I received the order and it was signed and faxed.  Qu[place]ally Callas,cma

## 2022-09-11 ENCOUNTER — Telehealth: Payer: Self-pay

## 2022-09-11 DIAGNOSIS — Z7984 Long term (current) use of oral hypoglycemic drugs: Secondary | ICD-10-CM | POA: Diagnosis not present

## 2022-09-11 DIAGNOSIS — Z87891 Personal history of nicotine dependence: Secondary | ICD-10-CM | POA: Diagnosis not present

## 2022-09-11 DIAGNOSIS — Z9181 History of falling: Secondary | ICD-10-CM | POA: Diagnosis not present

## 2022-09-11 DIAGNOSIS — I4892 Unspecified atrial flutter: Secondary | ICD-10-CM | POA: Diagnosis not present

## 2022-09-11 DIAGNOSIS — E039 Hypothyroidism, unspecified: Secondary | ICD-10-CM | POA: Diagnosis not present

## 2022-09-11 DIAGNOSIS — E119 Type 2 diabetes mellitus without complications: Secondary | ICD-10-CM | POA: Diagnosis not present

## 2022-09-11 DIAGNOSIS — M21372 Foot drop, left foot: Secondary | ICD-10-CM | POA: Diagnosis not present

## 2022-09-11 DIAGNOSIS — Z7951 Long term (current) use of inhaled steroids: Secondary | ICD-10-CM | POA: Diagnosis not present

## 2022-09-11 DIAGNOSIS — Z7901 Long term (current) use of anticoagulants: Secondary | ICD-10-CM | POA: Diagnosis not present

## 2022-09-11 DIAGNOSIS — J449 Chronic obstructive pulmonary disease, unspecified: Secondary | ICD-10-CM | POA: Diagnosis not present

## 2022-09-11 DIAGNOSIS — I4891 Unspecified atrial fibrillation: Secondary | ICD-10-CM | POA: Diagnosis not present

## 2022-09-11 DIAGNOSIS — E785 Hyperlipidemia, unspecified: Secondary | ICD-10-CM | POA: Diagnosis not present

## 2022-09-11 NOTE — Telephone Encounter (Signed)
Shaun called from [facility] to state patient states he fell last week.  Shaun states patient is not sure exactly which day it was last week.  Shaun states patient scraped his left elbow during the fall.

## 2022-09-12 ENCOUNTER — Other Ambulatory Visit: Payer: Self-pay | Admitting: Cardiovascular Disease

## 2022-09-13 DIAGNOSIS — E039 Hypothyroidism, unspecified: Secondary | ICD-10-CM | POA: Diagnosis not present

## 2022-09-13 DIAGNOSIS — I4892 Unspecified atrial flutter: Secondary | ICD-10-CM | POA: Diagnosis not present

## 2022-09-13 DIAGNOSIS — Z7951 Long term (current) use of inhaled steroids: Secondary | ICD-10-CM | POA: Diagnosis not present

## 2022-09-13 DIAGNOSIS — Z9181 History of falling: Secondary | ICD-10-CM | POA: Diagnosis not present

## 2022-09-13 DIAGNOSIS — E119 Type 2 diabetes mellitus without complications: Secondary | ICD-10-CM | POA: Diagnosis not present

## 2022-09-13 DIAGNOSIS — Z7984 Long term (current) use of oral hypoglycemic drugs: Secondary | ICD-10-CM | POA: Diagnosis not present

## 2022-09-13 DIAGNOSIS — Z7901 Long term (current) use of anticoagulants: Secondary | ICD-10-CM | POA: Diagnosis not present

## 2022-09-13 DIAGNOSIS — I4891 Unspecified atrial fibrillation: Secondary | ICD-10-CM | POA: Diagnosis not present

## 2022-09-13 DIAGNOSIS — Z87891 Personal history of nicotine dependence: Secondary | ICD-10-CM | POA: Diagnosis not present

## 2022-09-13 DIAGNOSIS — M21372 Foot drop, left foot: Secondary | ICD-10-CM | POA: Diagnosis not present

## 2022-09-13 DIAGNOSIS — J449 Chronic obstructive pulmonary disease, unspecified: Secondary | ICD-10-CM | POA: Diagnosis not present

## 2022-09-13 DIAGNOSIS — E785 Hyperlipidemia, unspecified: Secondary | ICD-10-CM | POA: Diagnosis not present

## 2022-09-18 ENCOUNTER — Telehealth: Payer: Self-pay | Admitting: Family Medicine

## 2022-09-18 NOTE — Telephone Encounter (Signed)
Copied from [place] [phone]. Topic: Medicare AWV >> Sep 18, 2022  2:46 PM Devoria Glassing wrote: Reason for CRM: Attempted to schedule AWV. Unable to LVM.  Will try at later time.

## 2022-09-19 DIAGNOSIS — Z7951 Long term (current) use of inhaled steroids: Secondary | ICD-10-CM | POA: Diagnosis not present

## 2022-09-19 DIAGNOSIS — I4891 Unspecified atrial fibrillation: Secondary | ICD-10-CM | POA: Diagnosis not present

## 2022-09-19 DIAGNOSIS — Z7901 Long term (current) use of anticoagulants: Secondary | ICD-10-CM | POA: Diagnosis not present

## 2022-09-19 DIAGNOSIS — Z87891 Personal history of nicotine dependence: Secondary | ICD-10-CM | POA: Diagnosis not present

## 2022-09-19 DIAGNOSIS — E119 Type 2 diabetes mellitus without complications: Secondary | ICD-10-CM | POA: Diagnosis not present

## 2022-09-19 DIAGNOSIS — Z7984 Long term (current) use of oral hypoglycemic drugs: Secondary | ICD-10-CM | POA: Diagnosis not present

## 2022-09-19 DIAGNOSIS — Z9181 History of falling: Secondary | ICD-10-CM | POA: Diagnosis not present

## 2022-09-19 DIAGNOSIS — E785 Hyperlipidemia, unspecified: Secondary | ICD-10-CM | POA: Diagnosis not present

## 2022-09-19 DIAGNOSIS — I4892 Unspecified atrial flutter: Secondary | ICD-10-CM | POA: Diagnosis not present

## 2022-09-19 DIAGNOSIS — E039 Hypothyroidism, unspecified: Secondary | ICD-10-CM | POA: Diagnosis not present

## 2022-09-19 DIAGNOSIS — M21372 Foot drop, left foot: Secondary | ICD-10-CM | POA: Diagnosis not present

## 2022-09-19 DIAGNOSIS — J449 Chronic obstructive pulmonary disease, unspecified: Secondary | ICD-10-CM | POA: Diagnosis not present

## 2022-09-24 ENCOUNTER — Other Ambulatory Visit: Payer: Self-pay

## 2022-09-24 DIAGNOSIS — E039 Hypothyroidism, unspecified: Secondary | ICD-10-CM

## 2022-09-24 MED ORDER — LEVOTHYROXINE SODIUM 25 MCG PO TABS: 25.0000 ug | ORAL_TABLET | Freq: Every day | ORAL | 1 refills | Status: DC

## 2022-09-26 DIAGNOSIS — E039 Hypothyroidism, unspecified: Secondary | ICD-10-CM | POA: Diagnosis not present

## 2022-09-26 DIAGNOSIS — I4892 Unspecified atrial flutter: Secondary | ICD-10-CM | POA: Diagnosis not present

## 2022-09-26 DIAGNOSIS — J449 Chronic obstructive pulmonary disease, unspecified: Secondary | ICD-10-CM | POA: Diagnosis not present

## 2022-09-26 DIAGNOSIS — E119 Type 2 diabetes mellitus without complications: Secondary | ICD-10-CM | POA: Diagnosis not present

## 2022-09-26 DIAGNOSIS — Z87891 Personal history of nicotine dependence: Secondary | ICD-10-CM | POA: Diagnosis not present

## 2022-09-26 DIAGNOSIS — M21372 Foot drop, left foot: Secondary | ICD-10-CM | POA: Diagnosis not present

## 2022-09-26 DIAGNOSIS — I4891 Unspecified atrial fibrillation: Secondary | ICD-10-CM | POA: Diagnosis not present

## 2022-09-26 DIAGNOSIS — Z7951 Long term (current) use of inhaled steroids: Secondary | ICD-10-CM | POA: Diagnosis not present

## 2022-09-26 DIAGNOSIS — E785 Hyperlipidemia, unspecified: Secondary | ICD-10-CM | POA: Diagnosis not present

## 2022-09-26 DIAGNOSIS — Z9181 History of falling: Secondary | ICD-10-CM | POA: Diagnosis not present

## 2022-09-26 DIAGNOSIS — Z7984 Long term (current) use of oral hypoglycemic drugs: Secondary | ICD-10-CM | POA: Diagnosis not present

## 2022-09-26 DIAGNOSIS — Z7901 Long term (current) use of anticoagulants: Secondary | ICD-10-CM | POA: Diagnosis not present

## 2022-09-30 ENCOUNTER — Telehealth: Payer: Self-pay | Admitting: Family Medicine

## 2022-09-30 NOTE — Telephone Encounter (Signed)
Copied from [place] [phone]. Topic: Medicare AWV >> Sep 30, 2022 11:52 AM Devoria Glassing wrote: Reason for CRM: Left message for patient to schedule Annual Wellness Visit.  Please schedule with Nurse Health Advisor Denisa O'Brien-Blaney, LPN at [facility]. This appt can be telephone or office visit.  Please call [phone] ask for [facility]

## 2022-10-02 DIAGNOSIS — E785 Hyperlipidemia, unspecified: Secondary | ICD-10-CM | POA: Diagnosis not present

## 2022-10-02 DIAGNOSIS — Z7984 Long term (current) use of oral hypoglycemic drugs: Secondary | ICD-10-CM | POA: Diagnosis not present

## 2022-10-02 DIAGNOSIS — E119 Type 2 diabetes mellitus without complications: Secondary | ICD-10-CM | POA: Diagnosis not present

## 2022-10-02 DIAGNOSIS — M21372 Foot drop, left foot: Secondary | ICD-10-CM | POA: Diagnosis not present

## 2022-10-02 DIAGNOSIS — Z7951 Long term (current) use of inhaled steroids: Secondary | ICD-10-CM | POA: Diagnosis not present

## 2022-10-02 DIAGNOSIS — I4892 Unspecified atrial flutter: Secondary | ICD-10-CM | POA: Diagnosis not present

## 2022-10-02 DIAGNOSIS — J449 Chronic obstructive pulmonary disease, unspecified: Secondary | ICD-10-CM | POA: Diagnosis not present

## 2022-10-02 DIAGNOSIS — Z87891 Personal history of nicotine dependence: Secondary | ICD-10-CM | POA: Diagnosis not present

## 2022-10-02 DIAGNOSIS — Z9181 History of falling: Secondary | ICD-10-CM | POA: Diagnosis not present

## 2022-10-02 DIAGNOSIS — Z7901 Long term (current) use of anticoagulants: Secondary | ICD-10-CM | POA: Diagnosis not present

## 2022-10-02 DIAGNOSIS — E039 Hypothyroidism, unspecified: Secondary | ICD-10-CM | POA: Diagnosis not present

## 2022-10-02 DIAGNOSIS — I4891 Unspecified atrial fibrillation: Secondary | ICD-10-CM | POA: Diagnosis not present

## 2022-10-09 ENCOUNTER — Other Ambulatory Visit
Admission: RE | Admit: 2022-10-09 | Discharge: 2022-10-09 | Disposition: A | Discharge status: Home / Self Care | Payer: Medicare HMO | Source: Ambulatory Visit | Attending: Nephrology | Admitting: Nephrology

## 2022-10-09 DIAGNOSIS — I10 Essential (primary) hypertension: Secondary | ICD-10-CM | POA: Insufficient documentation

## 2022-10-09 DIAGNOSIS — R600 Localized edema: Secondary | ICD-10-CM | POA: Diagnosis not present

## 2022-10-09 DIAGNOSIS — N1832 Chronic kidney disease, stage 3b: Secondary | ICD-10-CM | POA: Diagnosis not present

## 2022-10-09 DIAGNOSIS — M4310 Spondylolisthesis, site unspecified: Secondary | ICD-10-CM | POA: Diagnosis not present

## 2022-10-09 DIAGNOSIS — I129 Hypertensive chronic kidney disease with stage 1 through stage 4 chronic kidney disease, or unspecified chronic kidney disease: Secondary | ICD-10-CM | POA: Insufficient documentation

## 2022-10-09 DIAGNOSIS — E1122 Type 2 diabetes mellitus with diabetic chronic kidney disease: Secondary | ICD-10-CM | POA: Insufficient documentation

## 2022-10-09 DIAGNOSIS — M5136 Other intervertebral disc degeneration, lumbar region: Secondary | ICD-10-CM | POA: Diagnosis not present

## 2022-10-09 DIAGNOSIS — M48062 Spinal stenosis, lumbar region with neurogenic claudication: Secondary | ICD-10-CM | POA: Diagnosis not present

## 2022-10-09 DIAGNOSIS — M5416 Radiculopathy, lumbar region: Secondary | ICD-10-CM | POA: Diagnosis not present

## 2022-10-09 LAB — CBC WITH DIFFERENTIAL/PLATELET
Abs Immature Granulocytes: 0.02 10*3/uL (ref 0.00–0.07)
Basophils Absolute: 0 10*3/uL (ref 0.0–0.1)
Basophils Relative: 0 %
Eosinophils Absolute: 0.2 10*3/uL (ref 0.0–0.5)
Eosinophils Relative: 4 %
HCT: 38.7 % — ABNORMAL LOW (ref 39.0–52.0)
Hemoglobin: 12.7 g/dL — ABNORMAL LOW (ref 13.0–17.0)
Immature Granulocytes: 0 %
Lymphocytes Relative: 24 %
Lymphs Abs: 1.2 10*3/uL (ref 0.7–4.0)
MCH: 30.8 pg (ref 26.0–34.0)
MCHC: 32.8 g/dL (ref 30.0–36.0)
MCV: 93.9 fL (ref 80.0–100.0)
Monocytes Absolute: 0.6 10*3/uL (ref 0.1–1.0)
Monocytes Relative: 12 %
Neutro Abs: 3 10*3/uL (ref 1.7–7.7)
Neutrophils Relative %: 60 %
Platelets: 212 10*3/uL (ref 150–400)
RBC: 4.12 MIL/uL — ABNORMAL LOW (ref 4.22–5.81)
RDW: 13.8 % (ref 11.5–15.5)
WBC: 5 10*3/uL (ref 4.0–10.5)
nRBC: 0 % (ref 0.0–0.2)

## 2022-10-09 LAB — RENAL FUNCTION PANEL
Albumin: 3.8 g/dL (ref 3.5–5.0)
Anion gap: 6 (ref 5–15)
BUN: 31 mg/dL — ABNORMAL HIGH (ref 8–23)
CO2: 27 mmol/L (ref 22–32)
Calcium: 9.1 mg/dL (ref 8.9–10.3)
Chloride: 105 mmol/L (ref 98–111)
Creatinine, Ser: 1.69 mg/dL — ABNORMAL HIGH (ref 0.61–1.24)
GFR, Estimated: 37 mL/min — ABNORMAL LOW (ref >60)
Glucose, Bld: 125 mg/dL — ABNORMAL HIGH (ref 70–99)
Phosphorus: 3.7 mg/dL (ref 2.5–4.6)
Potassium: 4.4 mmol/L (ref 3.5–5.1)
Sodium: 138 mmol/L (ref 135–145)

## 2022-10-09 LAB — URINALYSIS, ROUTINE W REFLEX MICROSCOPIC
Bilirubin Urine: NEGATIVE
Glucose, UA: NEGATIVE mg/dL
Ketones, ur: NEGATIVE mg/dL
Leukocytes,Ua: NEGATIVE
Nitrite: NEGATIVE
Protein, ur: NEGATIVE mg/dL
Specific Gravity, Urine: 1.027 (ref 1.005–1.030)
pH: 5 (ref 5.0–8.0)

## 2022-10-09 LAB — PROTEIN / CREATININE RATIO, URINE
Creatinine, Urine: 210 mg/dL
Protein Creatinine Ratio: 0.12 mg/mg{Cre} (ref 0.00–0.15)
Total Protein, Urine: 25 mg/dL

## 2022-10-11 DIAGNOSIS — Z7901 Long term (current) use of anticoagulants: Secondary | ICD-10-CM | POA: Diagnosis not present

## 2022-10-11 DIAGNOSIS — J449 Chronic obstructive pulmonary disease, unspecified: Secondary | ICD-10-CM | POA: Diagnosis not present

## 2022-10-11 DIAGNOSIS — Z87891 Personal history of nicotine dependence: Secondary | ICD-10-CM | POA: Diagnosis not present

## 2022-10-11 DIAGNOSIS — Z9181 History of falling: Secondary | ICD-10-CM | POA: Diagnosis not present

## 2022-10-11 DIAGNOSIS — I4891 Unspecified atrial fibrillation: Secondary | ICD-10-CM | POA: Diagnosis not present

## 2022-10-11 DIAGNOSIS — E785 Hyperlipidemia, unspecified: Secondary | ICD-10-CM | POA: Diagnosis not present

## 2022-10-11 DIAGNOSIS — E039 Hypothyroidism, unspecified: Secondary | ICD-10-CM | POA: Diagnosis not present

## 2022-10-11 DIAGNOSIS — Z7951 Long term (current) use of inhaled steroids: Secondary | ICD-10-CM | POA: Diagnosis not present

## 2022-10-11 DIAGNOSIS — Z7984 Long term (current) use of oral hypoglycemic drugs: Secondary | ICD-10-CM | POA: Diagnosis not present

## 2022-10-11 DIAGNOSIS — E119 Type 2 diabetes mellitus without complications: Secondary | ICD-10-CM | POA: Diagnosis not present

## 2022-10-11 DIAGNOSIS — I4892 Unspecified atrial flutter: Secondary | ICD-10-CM | POA: Diagnosis not present

## 2022-10-11 DIAGNOSIS — M21372 Foot drop, left foot: Secondary | ICD-10-CM | POA: Diagnosis not present

## 2022-10-11 LAB — PTH, INTACT AND CALCIUM
Calcium, Total (PTH): 9.2 mg/dL (ref 8.6–10.2)
PTH: 38 pg/mL (ref 15–65)

## 2022-10-15 DIAGNOSIS — R600 Localized edema: Secondary | ICD-10-CM | POA: Diagnosis not present

## 2022-10-15 DIAGNOSIS — N1832 Chronic kidney disease, stage 3b: Secondary | ICD-10-CM | POA: Diagnosis not present

## 2022-10-15 DIAGNOSIS — E1122 Type 2 diabetes mellitus with diabetic chronic kidney disease: Secondary | ICD-10-CM | POA: Diagnosis not present

## 2022-10-15 DIAGNOSIS — I10 Essential (primary) hypertension: Secondary | ICD-10-CM | POA: Diagnosis not present

## 2022-10-17 DIAGNOSIS — Z87891 Personal history of nicotine dependence: Secondary | ICD-10-CM | POA: Diagnosis not present

## 2022-10-17 DIAGNOSIS — E785 Hyperlipidemia, unspecified: Secondary | ICD-10-CM | POA: Diagnosis not present

## 2022-10-17 DIAGNOSIS — Z7984 Long term (current) use of oral hypoglycemic drugs: Secondary | ICD-10-CM | POA: Diagnosis not present

## 2022-10-17 DIAGNOSIS — E039 Hypothyroidism, unspecified: Secondary | ICD-10-CM | POA: Diagnosis not present

## 2022-10-17 DIAGNOSIS — I4892 Unspecified atrial flutter: Secondary | ICD-10-CM | POA: Diagnosis not present

## 2022-10-17 DIAGNOSIS — Z7951 Long term (current) use of inhaled steroids: Secondary | ICD-10-CM | POA: Diagnosis not present

## 2022-10-17 DIAGNOSIS — Z9181 History of falling: Secondary | ICD-10-CM | POA: Diagnosis not present

## 2022-10-17 DIAGNOSIS — I4891 Unspecified atrial fibrillation: Secondary | ICD-10-CM | POA: Diagnosis not present

## 2022-10-17 DIAGNOSIS — E119 Type 2 diabetes mellitus without complications: Secondary | ICD-10-CM | POA: Diagnosis not present

## 2022-10-17 DIAGNOSIS — J449 Chronic obstructive pulmonary disease, unspecified: Secondary | ICD-10-CM | POA: Diagnosis not present

## 2022-10-17 DIAGNOSIS — M21372 Foot drop, left foot: Secondary | ICD-10-CM | POA: Diagnosis not present

## 2022-10-17 DIAGNOSIS — Z7901 Long term (current) use of anticoagulants: Secondary | ICD-10-CM | POA: Diagnosis not present

## 2022-10-22 ENCOUNTER — Other Ambulatory Visit: Payer: Self-pay | Admitting: Family Medicine

## 2022-10-22 DIAGNOSIS — I4891 Unspecified atrial fibrillation: Secondary | ICD-10-CM | POA: Diagnosis not present

## 2022-10-22 DIAGNOSIS — E119 Type 2 diabetes mellitus without complications: Secondary | ICD-10-CM | POA: Diagnosis not present

## 2022-10-22 DIAGNOSIS — Z7984 Long term (current) use of oral hypoglycemic drugs: Secondary | ICD-10-CM | POA: Diagnosis not present

## 2022-10-22 DIAGNOSIS — I4892 Unspecified atrial flutter: Secondary | ICD-10-CM | POA: Diagnosis not present

## 2022-10-22 DIAGNOSIS — E785 Hyperlipidemia, unspecified: Secondary | ICD-10-CM | POA: Diagnosis not present

## 2022-10-22 DIAGNOSIS — Z87891 Personal history of nicotine dependence: Secondary | ICD-10-CM | POA: Diagnosis not present

## 2022-10-22 DIAGNOSIS — Z9181 History of falling: Secondary | ICD-10-CM | POA: Diagnosis not present

## 2022-10-22 DIAGNOSIS — J449 Chronic obstructive pulmonary disease, unspecified: Secondary | ICD-10-CM | POA: Diagnosis not present

## 2022-10-22 DIAGNOSIS — Z7901 Long term (current) use of anticoagulants: Secondary | ICD-10-CM | POA: Diagnosis not present

## 2022-10-22 DIAGNOSIS — Z7951 Long term (current) use of inhaled steroids: Secondary | ICD-10-CM | POA: Diagnosis not present

## 2022-10-22 DIAGNOSIS — M21372 Foot drop, left foot: Secondary | ICD-10-CM | POA: Diagnosis not present

## 2022-10-22 DIAGNOSIS — E039 Hypothyroidism, unspecified: Secondary | ICD-10-CM | POA: Diagnosis not present

## 2022-11-01 DIAGNOSIS — Z7951 Long term (current) use of inhaled steroids: Secondary | ICD-10-CM | POA: Diagnosis not present

## 2022-11-01 DIAGNOSIS — Z7984 Long term (current) use of oral hypoglycemic drugs: Secondary | ICD-10-CM | POA: Diagnosis not present

## 2022-11-01 DIAGNOSIS — Z9181 History of falling: Secondary | ICD-10-CM | POA: Diagnosis not present

## 2022-11-01 DIAGNOSIS — E119 Type 2 diabetes mellitus without complications: Secondary | ICD-10-CM | POA: Diagnosis not present

## 2022-11-01 DIAGNOSIS — Z87891 Personal history of nicotine dependence: Secondary | ICD-10-CM | POA: Diagnosis not present

## 2022-11-01 DIAGNOSIS — E039 Hypothyroidism, unspecified: Secondary | ICD-10-CM | POA: Diagnosis not present

## 2022-11-01 DIAGNOSIS — Z7901 Long term (current) use of anticoagulants: Secondary | ICD-10-CM | POA: Diagnosis not present

## 2022-11-01 DIAGNOSIS — J449 Chronic obstructive pulmonary disease, unspecified: Secondary | ICD-10-CM | POA: Diagnosis not present

## 2022-11-01 DIAGNOSIS — E785 Hyperlipidemia, unspecified: Secondary | ICD-10-CM | POA: Diagnosis not present

## 2022-11-01 DIAGNOSIS — I4891 Unspecified atrial fibrillation: Secondary | ICD-10-CM | POA: Diagnosis not present

## 2022-11-01 DIAGNOSIS — M21372 Foot drop, left foot: Secondary | ICD-10-CM | POA: Diagnosis not present

## 2022-11-01 DIAGNOSIS — I4892 Unspecified atrial flutter: Secondary | ICD-10-CM | POA: Diagnosis not present

## 2022-11-04 DIAGNOSIS — Z85828 Personal history of other malignant neoplasm of skin: Secondary | ICD-10-CM | POA: Diagnosis not present

## 2022-11-04 DIAGNOSIS — D2262 Melanocytic nevi of left upper limb, including shoulder: Secondary | ICD-10-CM | POA: Diagnosis not present

## 2022-11-04 DIAGNOSIS — D2271 Melanocytic nevi of right lower limb, including hip: Secondary | ICD-10-CM | POA: Diagnosis not present

## 2022-11-04 DIAGNOSIS — D2261 Melanocytic nevi of right upper limb, including shoulder: Secondary | ICD-10-CM | POA: Diagnosis not present

## 2022-11-04 DIAGNOSIS — L570 Actinic keratosis: Secondary | ICD-10-CM | POA: Diagnosis not present

## 2022-11-13 ENCOUNTER — Ambulatory Visit (INDEPENDENT_AMBULATORY_CARE_PROVIDER_SITE_OTHER): Payer: Medicare HMO

## 2022-11-13 VITALS — Ht 66.0 in | Wt 159.0 lb

## 2022-11-13 DIAGNOSIS — Z0000 Encounter for general adult medical examination without abnormal findings: Secondary | ICD-10-CM

## 2022-11-13 NOTE — Patient Instructions (Addendum)
Devin Reese , Thank you for taking time to come for your Medicare Wellness Visit. I appreciate your ongoing commitment to your health goals. Please review the following plan we discussed and let me know if I can assist you in the future.   These are the goals we discussed:  Goals       Patient Stated     Stay hydrated (pt-stated)      Healthy diet        This is a list of the screening recommended for you and due dates:  Health Maintenance  Topic Date Due   Zoster (Shingles) Vaccine (1 of 2) Never done   COVID-19 Vaccine (4 - 2023-24 season) 11/29/2022*   Hemoglobin A1C  02/05/2023   Complete foot exam   05/02/2023   Eye exam for diabetics  05/15/2023   Medicare Annual Wellness Visit  11/14/2023   DTaP/Tdap/Td vaccine (2 - Td or Tdap) 04/09/2030   Pneumonia Vaccine  Completed   Flu Shot  Completed   HPV Vaccine  Aged Out  *Topic was postponed. The date shown is not the original due date.   Conditions/risks identified: none new  Next appointment: Follow up in one year for your annual wellness visit.   Preventive Care 79 Years and Older, Male  Preventive care refers to lifestyle choices and visits with your health care provider that can promote health and wellness. What does preventive care include? A yearly physical exam. This is also called an annual well check. Dental exams once or twice a year. Routine eye exams. Ask your health care provider how often you should have your eyes checked. Personal lifestyle choices, including: Daily care of your teeth and gums. Regular physical activity. Eating a healthy diet. Avoiding tobacco and drug use. Limiting alcohol use. Practicing safe sex. Taking low doses of aspirin every day. Taking vitamin and mineral supplements as recommended by your health care provider. What happens during an annual well check? The services and screenings done by your health care provider during your annual well check will depend on your age, overall  health, lifestyle risk factors, and family history of disease. Counseling  Your health care provider may ask you questions about your: Alcohol use. Tobacco use. Drug use. Emotional well-being. Home and relationship well-being. Sexual activity. Eating habits. History of falls. Memory and ability to understand (cognition). Work and work Statistician. Screening  You may have the following tests or measurements: Height, weight, and BMI. Blood pressure. Lipid and cholesterol levels. These may be checked every 5 years, or more frequently if you are over 68 years old. Skin check. Lung cancer screening. You may have this screening every year starting at age 98 if you have a 30-pack-year history of smoking and currently smoke or have quit within the past 15 years. Fecal occult blood test (FOBT) of the stool. You may have this test every year starting at age 48. Flexible sigmoidoscopy or colonoscopy. You may have a sigmoidoscopy every 5 years or a colonoscopy every 10 years starting at age 58. Prostate cancer screening. Recommendations will vary depending on your family history and other risks. Hepatitis C blood test. Hepatitis B blood test. Sexually transmitted disease (STD) testing. Diabetes screening. This is done by checking your blood sugar (glucose) after you have not eaten for a while (fasting). You may have this done every 1-3 years. Abdominal aortic aneurysm (AAA) screening. You may need this if you are a current or former smoker. Osteoporosis. You may be screened starting at  age 37 if you are at high risk. Talk with your health care provider about your test results, treatment options, and if necessary, the need for more tests. Vaccines  Your health care provider may recommend certain vaccines, such as: Influenza vaccine. This is recommended every year. Tetanus, diphtheria, and acellular pertussis (Tdap, Td) vaccine. You may need a Td booster every 10 years. Zoster vaccine. You may  need this after age 64. Pneumococcal 13-valent conjugate (PCV13) vaccine. One dose is recommended after age 78. Pneumococcal polysaccharide (PPSV23) vaccine. One dose is recommended after age 4. Talk to your health care provider about which screenings and vaccines you need and how often you need them. This information is not intended to replace advice given to you by your health care provider. Make sure you discuss any questions you have with your health care provider. Document Released: 12/01/2015 Document Revised: 07/24/2016 Document Reviewed: 09/05/2015 Elsevier Interactive Patient Education  2017 [place] Prevention in the Home Falls can cause injuries. They can happen to people of all ages. There are many things you can do to make your home safe and to help prevent falls. What can I do on the outside of my home? Regularly fix the edges of walkways and driveways and fix any cracks. Remove anything that might make you trip as you walk through a door, such as a raised step or threshold. Trim any bushes or trees on the path to your home. Use bright outdoor lighting. Clear any walking paths of anything that might make someone trip, such as rocks or tools. Regularly check to see if handrails are loose or broken. Make sure that both sides of any steps have handrails. Any raised decks and porches should have guardrails on the edges. Have any leaves, snow, or ice cleared regularly. Use sand or salt on walking paths during winter. Clean up any spills in your garage right away. This includes oil or grease spills. What can I do in the bathroom? Use night lights. Install grab bars by the toilet and in the tub and shower. Do not use towel bars as grab bars. Use non-skid mats or decals in the tub or shower. If you need to sit down in the shower, use a plastic, non-slip stool. Keep the floor dry. Clean up any water that spills on the floor as soon as it happens. Remove soap buildup in  the tub or shower regularly. Attach bath mats securely with double-sided non-slip rug tape. Do not have throw rugs and other things on the floor that can make you trip. What can I do in the bedroom? Use night lights. Make sure that you have a light by your bed that is easy to reach. Do not use any sheets or blankets that are too big for your bed. They should not hang down onto the floor. Have a firm chair that has side arms. You can use this for support while you get dressed. Do not have throw rugs and other things on the floor that can make you trip. What can I do in the kitchen? Clean up any spills right away. Avoid walking on wet floors. Keep items that you use a lot in easy-to-reach places. If you need to reach something above you, use a strong step stool that has a grab bar. Keep electrical cords out of the way. Do not use floor polish or wax that makes floors slippery. If you must use wax, use non-skid floor wax. Do not have throw rugs and  other things on the floor that can make you trip. What can I do with my stairs? Do not leave any items on the stairs. Make sure that there are handrails on both sides of the stairs and use them. Fix handrails that are broken or loose. Make sure that handrails are as long as the stairways. Check any carpeting to make sure that it is firmly attached to the stairs. Fix any carpet that is loose or worn. Avoid having throw rugs at the top or bottom of the stairs. If you do have throw rugs, attach them to the floor with carpet tape. Make sure that you have a light switch at the top of the stairs and the bottom of the stairs. If you do not have them, ask someone to add them for you. What else can I do to help prevent falls? Wear shoes that: Do not have high heels. Have rubber bottoms. Are comfortable and fit you well. Are closed at the toe. Do not wear sandals. If you use a stepladder: Make sure that it is fully opened. Do not climb a closed  stepladder. Make sure that both sides of the stepladder are locked into place. Ask someone to hold it for you, if possible. Clearly mark and make sure that you can see: Any grab bars or handrails. First and last steps. Where the edge of each step is. Use tools that help you move around (mobility aids) if they are needed. These include: Canes. Walkers. Scooters. Crutches. Turn on the lights when you go into a dark area. Replace any light bulbs as soon as they burn out. Set up your furniture so you have a clear path. Avoid moving your furniture around. If any of your floors are uneven, fix them. If there are any pets around you, be aware of where they are. Review your medicines with your doctor. Some medicines can make you feel dizzy. This can increase your chance of falling. Ask your doctor what other things that you can do to help prevent falls. This information is not intended to replace advice given to you by your health care provider. Make sure you discuss any questions you have with your health care provider. Document Released: 08/31/2009 Document Revised: 04/11/2016 Document Reviewed: 12/09/2014 Elsevier Interactive Patient Education  2017 [employer].

## 2022-11-13 NOTE — Progress Notes (Signed)
Subjective:   Devin Reese is a 88 y.o. male who presents for Medicare Annual/Subsequent preventive examination.  Review of Systems    No ROS.  Medicare Wellness Virtual Visit.  Visual/audio telehealth visit, UTA vital signs.   See social history for additional risk factors.   Cardiac Risk Factors include: advanced age (>36mn, >>61women);diabetes mellitus;male gender     Objective:    Today's Vitals   11/13/22 1340  Weight: 159 lb (72.1 kg)  Height: '5\' 6"'$  (1.676 m)   Body mass index is 25.66 kg/m.     11/13/2022    1:39 PM 11/23/2021    4:45 PM 07/17/2020   10:57 AM 04/09/2020    9:43 AM 07/15/2019   11:03 AM 06/04/2018    8:43 AM 11/20/2017    1:43 PM  Advanced Directives  Does Patient Have a Medical Advance Directive? No No No No No No No  Would patient like information on creating a medical advance directive? No - Patient declined No - Patient declined No - Patient declined No - Patient declined No - Patient declined No - Patient declined No - Patient declined    Current Medications (verified) Outpatient Encounter Medications as of 11/13/2022  Medication Sig   albuterol (VENTOLIN HFA) 108 (90 Base) MCG/ACT inhaler Inhale 1-2 puffs into the lungs every 4 (four) hours as needed for wheezing or shortness of breath.   apixaban (ELIQUIS) 2.5 MG TABS tablet Take 1 tablet (2.5 mg total) by mouth 2 (two) times daily.   atorvastatin (LIPITOR) 40 MG tablet Take 1 tablet (40 mg total) by mouth daily.   bisacodyl (DULCOLAX) 5 MG EC tablet Take 5 mg by mouth daily as needed.   Fluticasone-Umeclidin-Vilant (TRELEGY ELLIPTA IN) Inhale 1 puff into the lungs daily.   levothyroxine (SYNTHROID) 25 MCG tablet Take 1 tablet (25 mcg total) by mouth daily before breakfast.   nitroGLYCERIN (NITROSTAT) 0.4 MG SL tablet Place 1 tablet (0.4 mg total) under the tongue every 5 (five) minutes as needed for chest pain.   TRADJENTA 5 MG TABS tablet Take 1 tablet (5 mg total) by mouth daily.    No facility-administered encounter medications on file as of 11/13/2022.    Allergies (verified) Patient has no known allergies.   History: Past Medical History:  Diagnosis Date   Atrial flutter ([facility]    CAD (coronary artery disease)    CKD (chronic kidney disease), stage II    COPD (chronic obstructive pulmonary disease) (HCC)    Coronary artery disease    [facility] Most recent cardiac catheterization in [id number] complicated by stroke.   Diabetes mellitus without complication ([facility]    Hyperlipidemia    Hypertension    Prostate cancer ([facility]    [facility]    Past Surgical History:  Procedure Laterality Date   CARDIAC CATHETERIZATION     CARDIAC SURGERY     CARDIOVERSION  03/29/13   CORONARY ARTERY BYPASS GRAFT     Family History  Problem Relation Age of Onset   Heart disease Father    Coronary artery disease Father    Cancer Other        family hx   Coronary artery disease Other        family hx    Diabetes Brother    Heart attack Brother    Social History   Socioeconomic History   Marital status: Married    Spouse name: Not on file   Number of children:  Not on file   Years of education: Not on file   Highest education level: Not on file  Occupational History   Not on file  Tobacco Use   Smoking status: Former    Packs/day: 1.00    Years: 25.00    Total pack years: 25.00    Types: Cigarettes   Smokeless tobacco: Never  Vaping Use   Vaping Use: Never used  Substance and Sexual Activity   Alcohol use: No    Comment: Rare   Drug use: No   Sexual activity: Not on file  Other Topics Concern   Not on file  Social History Narrative   ** Merged History Encounter **       Retired, married, does not get regular exercise.    Social Determinants of Health   Financial Resource Strain: Low Risk  (11/13/2022)   Overall Financial Resource Strain (CARDIA)    Difficulty of Paying Living Expenses: Not hard at all  Food Insecurity: No Food  Insecurity (11/13/2022)   Hunger Vital Sign    Worried About Running Out of Food in the Last Year: Never true    Ran Out of Food in the Last Year: Never true  Transportation Needs: No Transportation Needs (11/13/2022)   PRAPARE - Hydrologist (Medical): No    Lack of Transportation (Non-Medical): No  Physical Activity: Insufficiently Active (11/13/2022)   Exercise Vital Sign    Days of Exercise per Week: 3 days    Minutes of Exercise per Session: 10 min  Stress: No Stress Concern Present (11/13/2022)   [place]    Feeling of Stress : Not at all  Social Connections: Unknown (11/13/2022)   Social Connection and Isolation Panel [NHANES]    Frequency of Communication with Friends and Family: Not on file    Frequency of Social Gatherings with Friends and Family: Not on file    Attends Religious Services: Not on file    Active Member of Clubs or Organizations: Not on file    Attends Archivist Meetings: Not on file    Marital Status: Married    Tobacco Counseling Counseling given: Not Answered   Clinical Intake:  Pre-visit preparation completed: Yes        Diabetes: Yes (Followed by PCP)  How often do you need to have someone help you when you read instructions, pamphlets, or other written materials from your doctor or pharmacy?: 2 - Rarely  Nutrition Risk Assessment: Has the patient had any N/V/D within the last 2 months?  No  Does the patient have any non-healing wounds?  No  Has the patient had any unintentional weight loss or weight gain?  No   Diabetes: Is the patient diabetic?  Yes  If diabetic, was a CBG obtained today?  No  Did the patient bring in their glucometer from home?  No   Financial Strains and Diabetes Management: Are you having any financial strains with the device, your supplies or your medication? No .  Does the patient want to be seen by  Chronic Care Management for management of their diabetes?  No  Would the patient like to be referred to a Nutritionist or for Diabetic Management?  No     Interpreter Needed?: No      Activities of Daily Living    11/13/2022    1:45 PM  In your present state of health, do you have any difficulty performing  the following activities:  Hearing? 1  Comment Hearing aids  Vision? 0  Difficulty concentrating or making decisions? 0  Walking or climbing stairs? 1  Comment Walker in use  Dressing or bathing? 0  Doing errands, shopping? 1  Comment [place] and eating ? Y  Comment wife manages meals. Self feeds.  Using the Toilet? N  In the past six months, have you accidently leaked urine? N  Do you have problems with loss of bowel control? N  Managing your Medications? Y  Comment Wife manages  Managing your Finances? Y  Comment Wife manages  Housekeeping or managing your Housekeeping? Y  Comment Wife manages    Patient Care Team: Leone Haven, MD as PCP - General (Family Medicine) Wellington Hampshire, MD as PCP - Cardiology (Cardiology) Lorelee Market, MD (Family Medicine)  Indicate any recent Medical Services you may have received from other than [facility] providers in the past year (date may be approximate).     Assessment:   This is a routine wellness examination for Devin Reese.  I connected with  Devin Reese on 11/13/22 by a audio enabled telemedicine application and verified that I am speaking with the correct person using two identifiers.  Patient Location: Home  Provider Location: Office/Clinic  I discussed the limitations of evaluation and management by telemedicine. The patient expressed understanding and agreed to proceed.   Hearing/Vision screen Hearing Screening - Comments:: Hearing aid, bilateral  Vision Screening - Comments:: Readers only Followed by [facility]  Dietary issues and exercise activities  discussed: Current Exercise Habits: Home exercise routine, Time (Minutes): 10, Frequency (Times/Week): 3, Weekly Exercise (Minutes/Week): 30, Intensity: Mild   Goals Addressed               This Visit's Progress     Patient Stated     Stay hydrated (pt-stated)        Healthy diet       Depression Screen    11/13/2022    2:31 PM 08/07/2022    4:06 PM 05/01/2022   11:42 AM 10/31/2021   11:13 AM 04/27/2021   10:39 AM 11/01/2020   10:41 AM 07/17/2020   10:42 AM  PHQ 2/9 Scores  PHQ - 2 Score 1 0 0 0 0 0 0    Fall Risk    11/13/2022    2:32 PM 08/07/2022    4:06 PM 10/31/2021   11:12 AM 04/27/2021   10:39 AM 07/17/2020   10:58 AM  [place] in the past year? 0 0 1 0 0  Comment     None in the last 5 months  Number falls in past yr: 0 0 0 0   Injury with Fall? 0 0 0    Risk for fall due to : Impaired mobility No Fall Risks No Fall Risks;Impaired balance/gait    Follow up Falls evaluation completed;Falls prevention discussed;Education provided Falls evaluation completed Falls evaluation completed Falls evaluation completed Falls evaluation completed    FALL RISK PREVENTION PERTAINING TO THE HOME: Home free of loose throw rugs in walkways, pet beds, electrical cords, etc? Yes  Adequate lighting in your home to reduce risk of falls? Yes   ASSISTIVE DEVICES UTILIZED TO PREVENT FALLS: Life alert? No  Use of a cane, walker or w/c? Yes  Grab bars in the bathroom? No  Shower chair or bench in shower? No  Elevated toilet seat or a handicapped toilet? Yes   TIMED  UP AND GO: Was the test performed? No .    Cognitive Function:        11/13/2022    1:50 PM 07/17/2020   11:00 AM 07/15/2019   11:05 AM 06/04/2018    9:02 AM  6CIT Screen  What Year? 0 points 4 points 0 points 0 points  What month? 0 points 0 points 0 points 0 points  What time? 0 points 0 points 0 points 0 points  Count back from 20   0 points 0 points  Months in reverse   4 points 0 points   Repeat phrase    0 points  Total Score    0 points    Immunizations Immunization History  Administered Date(s) Administered   Fluad Quad(high Dose 65+) 08/12/2019, 09/20/2021, 08/07/2022   Influenza, High Dose Seasonal PF 08/20/2017, 09/29/2018   PFIZER(Purple Top)SARS-COV-2 Vaccination 12/02/2019, 12/23/2019   Pfizer Covid-19 Vaccine Bivalent Booster 30yr & up 10/30/2021   Pneumococcal Conjugate-13 02/01/2019   Pneumococcal Polysaccharide-23 12/25/2015   Tdap 04/09/2020    Shingrix Completed?: No.    Education has been provided regarding the importance of this vaccine. Patient has been advised to call insurance company to determine out of pocket expense if they have not yet received this vaccine. Advised may also receive vaccine at local pharmacy or Health Dept. Verbalized acceptance and understanding.  Screening Tests Health Maintenance  Topic Date Due   Zoster Vaccines- Shingrix (1 of 2) Never done   COVID-19 Vaccine (4 - 2023-24 season) 11/29/2022 (Originally 07/19/2022)   HEMOGLOBIN A1C  02/05/2023   FOOT EXAM  05/02/2023   OPHTHALMOLOGY EXAM  05/15/2023   Medicare Annual Wellness (AWV)  11/14/2023   DTaP/Tdap/Td (2 - Td or Tdap) 04/09/2030   Pneumonia Vaccine 88 Years old  Completed   INFLUENZA VACCINE  Completed   HPV VACCINES  Aged Out    Health Maintenance  Health Maintenance Due  Topic Date Due   Zoster Vaccines- Shingrix (1 of 2) Never done   DG Chest 2 View completed 11/23/21   Hepatitis C Screening: does not qualify  Vision Screening: Recommended annual ophthalmology exams for early detection of glaucoma and other disorders of the eye.  Dental Screening: Recommended annual dental exams for proper oral hygiene  Community Resource Referral / Chronic Care Management: CRR required this visit?  No   CCM required this visit?  No      Plan:     I have personally reviewed and noted the following in the patient's chart:   Medical and social history Use  of alcohol, tobacco or illicit drugs  Current medications and supplements including opioid prescriptions. Patient is not currently taking opioid prescriptions. Functional ability and status Nutritional status Physical activity Advanced directives List of other physicians Hospitalizations, surgeries, and ER visits in previous 12 months Vitals Screenings to include cognitive, depression, and falls Referrals and appointments  In addition, I have reviewed and discussed with patient certain preventive protocols, quality metrics, and best practice recommendations. A written personalized care plan for preventive services as well as general preventive health recommendations were provided to patient.     DLeta Jungling LPN   [id number]

## 2023-01-23 ENCOUNTER — Other Ambulatory Visit: Payer: Self-pay | Admitting: Family Medicine

## 2023-02-05 ENCOUNTER — Ambulatory Visit (INDEPENDENT_AMBULATORY_CARE_PROVIDER_SITE_OTHER): Payer: Medicare HMO | Admitting: Family Medicine

## 2023-02-05 ENCOUNTER — Encounter: Payer: Self-pay | Admitting: Family Medicine

## 2023-02-05 VITALS — BP 128/76 | HR 71 | Temp 97.8°F | Ht 66.0 in | Wt 168.0 lb

## 2023-02-05 DIAGNOSIS — R911 Solitary pulmonary nodule: Secondary | ICD-10-CM

## 2023-02-05 DIAGNOSIS — J449 Chronic obstructive pulmonary disease, unspecified: Secondary | ICD-10-CM | POA: Diagnosis not present

## 2023-02-05 DIAGNOSIS — I4892 Unspecified atrial flutter: Secondary | ICD-10-CM | POA: Diagnosis not present

## 2023-02-05 DIAGNOSIS — R413 Other amnesia: Secondary | ICD-10-CM | POA: Diagnosis not present

## 2023-02-05 DIAGNOSIS — M21372 Foot drop, left foot: Secondary | ICD-10-CM

## 2023-02-05 DIAGNOSIS — E119 Type 2 diabetes mellitus without complications: Secondary | ICD-10-CM

## 2023-02-05 LAB — POCT GLYCOSYLATED HEMOGLOBIN (HGB A1C): Hemoglobin A1C: 6.4 % — AB (ref 4.0–5.6)

## 2023-02-05 MED ORDER — ALBUTEROL SULFATE HFA 108 (90 BASE) MCG/ACT IN AERS: 1.0000 | INHALATION_SPRAY | RESPIRATORY_TRACT | 0 refills | Status: DC | PRN

## 2023-02-05 MED ORDER — TRELEGY ELLIPTA 100-62.5-25 MCG/ACT IN AEPB: 1.0000 | INHALATION_SPRAY | Freq: Every day | RESPIRATORY_TRACT | 3 refills | Status: DC

## 2023-02-05 NOTE — Assessment & Plan Note (Signed)
Chronic issue.  Seems to be relatively mild in nature.  Discussed that his hearing would likely limit the ability to complete more extensive memory testing.  They will monitor and if it worsens we could have him see neurology.

## 2023-02-05 NOTE — Assessment & Plan Note (Signed)
Chronic issue.  Adequately controlled on home CBGs.  He will continue Tradjenta 5 mg daily.  Check A1c today.

## 2023-02-05 NOTE — Patient Instructions (Signed)
Nice to see you. Somebody should contact you to schedule CT scan of your chest to evaluate the lung nodule that was seen about a year ago. Home health should come out to start physical therapy. I have refilled your inhalers.

## 2023-02-05 NOTE — Assessment & Plan Note (Signed)
Chronic issue.  Patient benefited from physical therapy for this.  He may benefit from more to help with his ambulation and balance.  Home health referral placed.

## 2023-02-05 NOTE — Assessment & Plan Note (Signed)
Chronic issue.  Recent cough may be related to lack of inhalers.  He will restart Trelegy and can use albuterol as needed.

## 2023-02-05 NOTE — Progress Notes (Signed)
Devin Rumps, MD Phone: [phone]  Devin Reese is a 89 y.o. male who presents today for f/u.  Diabetes: Sugars are typically 170-164.  He is on Tradjenta.  Atrial flutter: Patient is on Eliquis 2.5 mg twice daily.  No chest pain or bleeding.  He has chronic dyspnea that is unchanged.  COPD: Patient's wife notes occasional coughing from the patient.  He has been out of both of his inhalers for some time.  Dropfoot: They wonder if they can get physical therapy again.  It seemed to help significantly with his balance and ability to get around with his dropfoot and prosthesis.  He has not had any falls in the last month or so.  He had no injury with his last fall.  Memory difficulty: The patient's daughter notes he has occasional memory issues where it is difficult to remember things.  Notes this is terribly significant.  Patient does have significant issues hearing.  Social History   Tobacco Use  Smoking Status Former   Packs/day: 1.00   Years: 25.00   Additional pack years: 0.00   Total pack years: 25.00   Types: Cigarettes  Smokeless Tobacco Never    Current Outpatient Medications on File Prior to Visit  Medication Sig Dispense Refill   apixaban (ELIQUIS) 2.5 MG TABS tablet Take 1 tablet (2.5 mg total) by mouth 2 (two) times daily. 60 tablet 6   bisacodyl (DULCOLAX) 5 MG EC tablet Take 5 mg by mouth daily as needed.     levothyroxine (SYNTHROID) 25 MCG tablet Take 1 tablet (25 mcg total) by mouth daily before breakfast. 90 tablet 1   nitroGLYCERIN (NITROSTAT) 0.4 MG SL tablet Place 1 tablet (0.4 mg total) under the tongue every 5 (five) minutes as needed for chest pain. 25 tablet 3   TRADJENTA 5 MG TABS tablet Take 1 tablet (5 mg total) by mouth daily. 30 tablet 3   atorvastatin (LIPITOR) 40 MG tablet Take 1 tablet (40 mg total) by mouth daily. 90 tablet 0   No current facility-administered medications on file prior to visit.     ROS see history of present  illness  Objective  Physical Exam Vitals:   02/05/23 1553  BP: 128/76  Pulse: 71  Temp: 97.8 F (36.6 C)  SpO2: 98%    BP Readings from Last 3 Encounters:  02/05/23 128/76  09/05/22 121/63  08/07/22 120/70   Wt Readings from Last 3 Encounters:  02/05/23 168 lb (76.2 kg)  11/13/22 159 lb (72.1 kg)  09/05/22 159 lb 6.4 oz (72.3 kg)    Physical Exam Constitutional:      General: He is not in acute distress.    Appearance: He is not diaphoretic.  Cardiovascular:     Rate and Rhythm: Normal rate. Rhythm irregularly irregular.     Heart sounds: Normal heart sounds.  Pulmonary:     Effort: Pulmonary effort is normal.     Breath sounds: Normal breath sounds.  Skin:    General: Skin is warm and dry.  Neurological:     Mental Status: He is alert.      Assessment/Plan: Please see individual problem list.  Type 2 diabetes mellitus without complication, without long-term current use of insulin ([employer]) Assessment & Plan: Chronic issue.  Adequately controlled on home CBGs.  He will continue Tradjenta 5 mg daily.  Check A1c today.  Orders: -     POCT glycosylated hemoglobin (Hb A1C)  Chronic obstructive pulmonary disease, unspecified COPD type ([place]) Assessment &  Plan: Chronic issue.  Recent cough may be related to lack of inhalers.  He will restart Trelegy and can use albuterol as needed.  Orders: -     Trelegy Ellipta; Inhale 1 puff into the lungs daily.  Dispense: 60 each; Refill: 3 -     Albuterol Sulfate HFA; Inhale 1-2 puffs into the lungs every 4 (four) hours as needed for wheezing or shortness of breath.  Dispense: 1 each; Refill: 0  Atrial flutter, unspecified type [facility]) Assessment & Plan: Chronic issue.  Rate controlled.  He will continue Eliquis 2.5 mg twice daily.   Left foot drop Assessment & Plan: Chronic issue.  Patient benefited from physical therapy for this.  He may benefit from more to help with his ambulation and balance.  Home health referral  placed.  Orders: -     Ambulatory referral to Home Health  Lung nodule seen on imaging study Assessment & Plan: Follow-up lung nodule ordered.  Orders: -     CT CHEST WO CONTRAST; Future  Memory difficulty Assessment & Plan: Chronic issue.  Seems to be relatively mild in nature.  Discussed that his hearing would likely limit the ability to complete more extensive memory testing.  They will monitor and if it worsens we could have him see neurology.     Return in about 6 months (around 08/08/2023).   Devin Rumps, MD [place]

## 2023-02-05 NOTE — Assessment & Plan Note (Signed)
Chronic issue.  Rate controlled.  He will continue Eliquis 2.5 mg twice daily.

## 2023-02-05 NOTE — Assessment & Plan Note (Signed)
Follow-up lung nodule ordered.

## 2023-02-10 ENCOUNTER — Telehealth: Payer: Self-pay | Admitting: Family Medicine

## 2023-02-10 DIAGNOSIS — M21372 Foot drop, left foot: Secondary | ICD-10-CM

## 2023-02-10 NOTE — Telephone Encounter (Signed)
Pt daughter called stating that they want [facility] for pt services and not [facility]. Pt daughter would like to be called

## 2023-02-10 NOTE — Telephone Encounter (Signed)
I placed the initial order incorrectly. I have changed the order and will send this to [place] as well. Please let the patients daughter know.

## 2023-02-11 NOTE — Telephone Encounter (Signed)
Called Patient's daughter Helene Kelp and let her know that [facility] will be calling them.

## 2023-02-13 DIAGNOSIS — Z7984 Long term (current) use of oral hypoglycemic drugs: Secondary | ICD-10-CM | POA: Diagnosis not present

## 2023-02-13 DIAGNOSIS — R531 Weakness: Secondary | ICD-10-CM | POA: Diagnosis not present

## 2023-02-13 DIAGNOSIS — M25511 Pain in right shoulder: Secondary | ICD-10-CM | POA: Diagnosis not present

## 2023-02-13 DIAGNOSIS — R911 Solitary pulmonary nodule: Secondary | ICD-10-CM | POA: Diagnosis not present

## 2023-02-13 DIAGNOSIS — H9190 Unspecified hearing loss, unspecified ear: Secondary | ICD-10-CM | POA: Diagnosis not present

## 2023-02-13 DIAGNOSIS — J449 Chronic obstructive pulmonary disease, unspecified: Secondary | ICD-10-CM | POA: Diagnosis not present

## 2023-02-13 DIAGNOSIS — R413 Other amnesia: Secondary | ICD-10-CM | POA: Diagnosis not present

## 2023-02-13 DIAGNOSIS — E119 Type 2 diabetes mellitus without complications: Secondary | ICD-10-CM | POA: Diagnosis not present

## 2023-02-13 DIAGNOSIS — Z7901 Long term (current) use of anticoagulants: Secondary | ICD-10-CM | POA: Diagnosis not present

## 2023-02-13 DIAGNOSIS — M21379 Foot drop, unspecified foot: Secondary | ICD-10-CM | POA: Diagnosis not present

## 2023-02-13 DIAGNOSIS — Z9181 History of falling: Secondary | ICD-10-CM | POA: Diagnosis not present

## 2023-02-13 DIAGNOSIS — Z87891 Personal history of nicotine dependence: Secondary | ICD-10-CM | POA: Diagnosis not present

## 2023-02-13 DIAGNOSIS — I4892 Unspecified atrial flutter: Secondary | ICD-10-CM | POA: Diagnosis not present

## 2023-02-17 ENCOUNTER — Ambulatory Visit
Admission: RE | Admit: 2023-02-17 | Discharge: 2023-02-17 | Disposition: A | Discharge status: Home / Self Care | Payer: Medicare HMO | Source: Ambulatory Visit | Attending: Family Medicine | Admitting: Family Medicine

## 2023-02-17 DIAGNOSIS — R911 Solitary pulmonary nodule: Secondary | ICD-10-CM | POA: Insufficient documentation

## 2023-02-17 DIAGNOSIS — R918 Other nonspecific abnormal finding of lung field: Secondary | ICD-10-CM | POA: Diagnosis not present

## 2023-02-20 DIAGNOSIS — R531 Weakness: Secondary | ICD-10-CM | POA: Diagnosis not present

## 2023-02-20 DIAGNOSIS — Z7984 Long term (current) use of oral hypoglycemic drugs: Secondary | ICD-10-CM | POA: Diagnosis not present

## 2023-02-20 DIAGNOSIS — J449 Chronic obstructive pulmonary disease, unspecified: Secondary | ICD-10-CM | POA: Diagnosis not present

## 2023-02-20 DIAGNOSIS — M21379 Foot drop, unspecified foot: Secondary | ICD-10-CM | POA: Diagnosis not present

## 2023-02-20 DIAGNOSIS — Z7901 Long term (current) use of anticoagulants: Secondary | ICD-10-CM | POA: Diagnosis not present

## 2023-02-20 DIAGNOSIS — R413 Other amnesia: Secondary | ICD-10-CM | POA: Diagnosis not present

## 2023-02-20 DIAGNOSIS — Z9181 History of falling: Secondary | ICD-10-CM | POA: Diagnosis not present

## 2023-02-20 DIAGNOSIS — Z87891 Personal history of nicotine dependence: Secondary | ICD-10-CM | POA: Diagnosis not present

## 2023-02-20 DIAGNOSIS — I4892 Unspecified atrial flutter: Secondary | ICD-10-CM | POA: Diagnosis not present

## 2023-02-20 DIAGNOSIS — E119 Type 2 diabetes mellitus without complications: Secondary | ICD-10-CM | POA: Diagnosis not present

## 2023-02-20 DIAGNOSIS — M25511 Pain in right shoulder: Secondary | ICD-10-CM | POA: Diagnosis not present

## 2023-02-20 DIAGNOSIS — R911 Solitary pulmonary nodule: Secondary | ICD-10-CM | POA: Diagnosis not present

## 2023-02-20 DIAGNOSIS — H9190 Unspecified hearing loss, unspecified ear: Secondary | ICD-10-CM | POA: Diagnosis not present

## 2023-02-21 ENCOUNTER — Other Ambulatory Visit: Payer: Self-pay | Admitting: Cardiovascular Disease

## 2023-02-21 DIAGNOSIS — I4892 Unspecified atrial flutter: Secondary | ICD-10-CM

## 2023-02-21 NOTE — Telephone Encounter (Signed)
Prescription refill request for Eliquis received. Indication: Afib  Last office visit: 06/20/22 [facility])  Scr: 1.69 (10/09/22)  Age: 89 Weight: 76.2kg  Appropriate dose. Refill sent.

## 2023-02-24 ENCOUNTER — Other Ambulatory Visit: Payer: Self-pay | Admitting: Family Medicine

## 2023-02-24 DIAGNOSIS — H9190 Unspecified hearing loss, unspecified ear: Secondary | ICD-10-CM | POA: Diagnosis not present

## 2023-02-24 DIAGNOSIS — R413 Other amnesia: Secondary | ICD-10-CM | POA: Diagnosis not present

## 2023-02-24 DIAGNOSIS — R911 Solitary pulmonary nodule: Secondary | ICD-10-CM | POA: Diagnosis not present

## 2023-02-24 DIAGNOSIS — Z7901 Long term (current) use of anticoagulants: Secondary | ICD-10-CM | POA: Diagnosis not present

## 2023-02-24 DIAGNOSIS — Z7984 Long term (current) use of oral hypoglycemic drugs: Secondary | ICD-10-CM | POA: Diagnosis not present

## 2023-02-24 DIAGNOSIS — M25511 Pain in right shoulder: Secondary | ICD-10-CM | POA: Diagnosis not present

## 2023-02-24 DIAGNOSIS — J929 Pleural plaque without asbestos: Secondary | ICD-10-CM

## 2023-02-24 DIAGNOSIS — I4892 Unspecified atrial flutter: Secondary | ICD-10-CM | POA: Diagnosis not present

## 2023-02-24 DIAGNOSIS — R531 Weakness: Secondary | ICD-10-CM | POA: Diagnosis not present

## 2023-02-24 DIAGNOSIS — J449 Chronic obstructive pulmonary disease, unspecified: Secondary | ICD-10-CM | POA: Diagnosis not present

## 2023-02-24 DIAGNOSIS — Z9181 History of falling: Secondary | ICD-10-CM | POA: Diagnosis not present

## 2023-02-24 DIAGNOSIS — E119 Type 2 diabetes mellitus without complications: Secondary | ICD-10-CM | POA: Diagnosis not present

## 2023-02-24 DIAGNOSIS — M21379 Foot drop, unspecified foot: Secondary | ICD-10-CM | POA: Diagnosis not present

## 2023-02-24 DIAGNOSIS — Z87891 Personal history of nicotine dependence: Secondary | ICD-10-CM | POA: Diagnosis not present

## 2023-02-26 DIAGNOSIS — R911 Solitary pulmonary nodule: Secondary | ICD-10-CM | POA: Diagnosis not present

## 2023-02-26 DIAGNOSIS — Z87891 Personal history of nicotine dependence: Secondary | ICD-10-CM | POA: Diagnosis not present

## 2023-02-26 DIAGNOSIS — E119 Type 2 diabetes mellitus without complications: Secondary | ICD-10-CM | POA: Diagnosis not present

## 2023-02-26 DIAGNOSIS — Z7901 Long term (current) use of anticoagulants: Secondary | ICD-10-CM | POA: Diagnosis not present

## 2023-02-26 DIAGNOSIS — J449 Chronic obstructive pulmonary disease, unspecified: Secondary | ICD-10-CM | POA: Diagnosis not present

## 2023-02-26 DIAGNOSIS — H9190 Unspecified hearing loss, unspecified ear: Secondary | ICD-10-CM | POA: Diagnosis not present

## 2023-02-26 DIAGNOSIS — R531 Weakness: Secondary | ICD-10-CM | POA: Diagnosis not present

## 2023-02-26 DIAGNOSIS — Z9181 History of falling: Secondary | ICD-10-CM

## 2023-02-26 DIAGNOSIS — I4892 Unspecified atrial flutter: Secondary | ICD-10-CM | POA: Diagnosis not present

## 2023-02-26 DIAGNOSIS — M25511 Pain in right shoulder: Secondary | ICD-10-CM | POA: Diagnosis not present

## 2023-02-26 DIAGNOSIS — Z7984 Long term (current) use of oral hypoglycemic drugs: Secondary | ICD-10-CM | POA: Diagnosis not present

## 2023-02-26 DIAGNOSIS — M21379 Foot drop, unspecified foot: Secondary | ICD-10-CM | POA: Diagnosis not present

## 2023-02-26 DIAGNOSIS — R413 Other amnesia: Secondary | ICD-10-CM | POA: Diagnosis not present

## 2023-03-03 DIAGNOSIS — E119 Type 2 diabetes mellitus without complications: Secondary | ICD-10-CM | POA: Diagnosis not present

## 2023-03-03 DIAGNOSIS — R911 Solitary pulmonary nodule: Secondary | ICD-10-CM | POA: Diagnosis not present

## 2023-03-03 DIAGNOSIS — Z7901 Long term (current) use of anticoagulants: Secondary | ICD-10-CM | POA: Diagnosis not present

## 2023-03-03 DIAGNOSIS — Z87891 Personal history of nicotine dependence: Secondary | ICD-10-CM | POA: Diagnosis not present

## 2023-03-03 DIAGNOSIS — Z7984 Long term (current) use of oral hypoglycemic drugs: Secondary | ICD-10-CM | POA: Diagnosis not present

## 2023-03-03 DIAGNOSIS — Z9181 History of falling: Secondary | ICD-10-CM | POA: Diagnosis not present

## 2023-03-03 DIAGNOSIS — R531 Weakness: Secondary | ICD-10-CM | POA: Diagnosis not present

## 2023-03-03 DIAGNOSIS — R413 Other amnesia: Secondary | ICD-10-CM | POA: Diagnosis not present

## 2023-03-03 DIAGNOSIS — M25511 Pain in right shoulder: Secondary | ICD-10-CM | POA: Diagnosis not present

## 2023-03-03 DIAGNOSIS — M21379 Foot drop, unspecified foot: Secondary | ICD-10-CM | POA: Diagnosis not present

## 2023-03-03 DIAGNOSIS — I4892 Unspecified atrial flutter: Secondary | ICD-10-CM | POA: Diagnosis not present

## 2023-03-03 DIAGNOSIS — H9190 Unspecified hearing loss, unspecified ear: Secondary | ICD-10-CM | POA: Diagnosis not present

## 2023-03-03 DIAGNOSIS — J449 Chronic obstructive pulmonary disease, unspecified: Secondary | ICD-10-CM | POA: Diagnosis not present

## 2023-03-11 DIAGNOSIS — H9190 Unspecified hearing loss, unspecified ear: Secondary | ICD-10-CM | POA: Diagnosis not present

## 2023-03-11 DIAGNOSIS — Z9181 History of falling: Secondary | ICD-10-CM | POA: Diagnosis not present

## 2023-03-11 DIAGNOSIS — Z7984 Long term (current) use of oral hypoglycemic drugs: Secondary | ICD-10-CM | POA: Diagnosis not present

## 2023-03-11 DIAGNOSIS — R911 Solitary pulmonary nodule: Secondary | ICD-10-CM | POA: Diagnosis not present

## 2023-03-11 DIAGNOSIS — J449 Chronic obstructive pulmonary disease, unspecified: Secondary | ICD-10-CM | POA: Diagnosis not present

## 2023-03-11 DIAGNOSIS — R531 Weakness: Secondary | ICD-10-CM | POA: Diagnosis not present

## 2023-03-11 DIAGNOSIS — Z7901 Long term (current) use of anticoagulants: Secondary | ICD-10-CM | POA: Diagnosis not present

## 2023-03-11 DIAGNOSIS — Z87891 Personal history of nicotine dependence: Secondary | ICD-10-CM | POA: Diagnosis not present

## 2023-03-11 DIAGNOSIS — R413 Other amnesia: Secondary | ICD-10-CM | POA: Diagnosis not present

## 2023-03-11 DIAGNOSIS — I4892 Unspecified atrial flutter: Secondary | ICD-10-CM | POA: Diagnosis not present

## 2023-03-11 DIAGNOSIS — E119 Type 2 diabetes mellitus without complications: Secondary | ICD-10-CM | POA: Diagnosis not present

## 2023-03-11 DIAGNOSIS — M21379 Foot drop, unspecified foot: Secondary | ICD-10-CM | POA: Diagnosis not present

## 2023-03-11 DIAGNOSIS — M25511 Pain in right shoulder: Secondary | ICD-10-CM | POA: Diagnosis not present

## 2023-03-14 ENCOUNTER — Ambulatory Visit: Payer: Medicare HMO | Attending: Cardiovascular Disease | Admitting: Cardiovascular Disease

## 2023-03-14 ENCOUNTER — Encounter: Payer: Self-pay | Admitting: Cardiovascular Disease

## 2023-03-14 DIAGNOSIS — I4892 Unspecified atrial flutter: Secondary | ICD-10-CM

## 2023-03-14 DIAGNOSIS — E785 Hyperlipidemia, unspecified: Secondary | ICD-10-CM

## 2023-03-14 DIAGNOSIS — I10 Essential (primary) hypertension: Secondary | ICD-10-CM

## 2023-03-14 DIAGNOSIS — I2510 Atherosclerotic heart disease of native coronary artery without angina pectoris: Secondary | ICD-10-CM

## 2023-03-14 NOTE — Progress Notes (Signed)
Cardiology Office Note   Date:  03/14/2023   ID:  Devin Reese, Devin Reese 1926-12-14, MRN [id number]  PCP:  Glori Luis, MD  Cardiologist:   Lorine Bears, MD   Chief Complaint  Patient presents with   Follow-up    6 month f/u no complaints today. Meds reviewed verbally with pt.      History of Present Illness: Devin Reese is a 89 y.o. male who presents for a followup visit regarding coronary artery disease and atrial flutter. He has known history of coronary artery disease status post CABG in 1991 at [facility]. He was diagnosed with atrial flutter in 2014 with subsequent successful cardioversion. No recurrent arrhythmia since then.  Most recent echocardiogram in 2018 showed mildly reduced LV systolic function with an EF of 40 to 45% with inferior wall hypokinesis, mild mitral and aortic regurgitation. Carvedilol was discontinued recently due to bradycardia.  Lexiscan Myoview in August 2018 showed prior inferior scar with no significant ischemia.  He has chronic kidney disease followed by Dr. Thedore Mins.  He had recurrent falls last year but none in the last 6 months.  He has been doing well overall with no chest pain or worsening dyspnea.  No palpitations.   Past Medical History:  Diagnosis Date   Atrial flutter (HCC)    CAD (coronary artery disease)    CKD (chronic kidney disease), stage II    COPD (chronic obstructive pulmonary disease) (HCC)    Coronary artery disease    CABG in 1991 at [facility]. Most recent cardiac catheterization in 2004 was complicated by stroke.   Diabetes mellitus without complication (HCC)    Hyperlipidemia    Hypertension    Prostate cancer (HCC)    Stroke [facility])     Past Surgical History:  Procedure Laterality Date   CARDIAC CATHETERIZATION     CARDIAC SURGERY     CARDIOVERSION  03/29/13   CORONARY ARTERY BYPASS GRAFT       Current Outpatient Medications  Medication Sig Dispense Refill   albuterol (VENTOLIN HFA) 108 (90 Base)  MCG/ACT inhaler Inhale 1-2 puffs into the lungs every 4 (four) hours as needed for wheezing or shortness of breath. 1 each 0   apixaban (ELIQUIS) 2.5 MG TABS tablet Take 1 tablet (2.5 mg total) by mouth 2 (two) times daily. 60 tablet 5   atorvastatin (LIPITOR) 40 MG tablet Take 1 tablet (40 mg total) by mouth daily. 90 tablet 0   bisacodyl (DULCOLAX) 5 MG EC tablet Take 5 mg by mouth daily as needed.     Fluticasone-Umeclidin-Vilant (TRELEGY ELLIPTA) 100-62.5-25 MCG/ACT AEPB Inhale 1 puff into the lungs daily. 60 each 3   levothyroxine (SYNTHROID) 25 MCG tablet Take 1 tablet (25 mcg total) by mouth daily before breakfast. 90 tablet 1   nitroGLYCERIN (NITROSTAT) 0.4 MG SL tablet Place 1 tablet (0.4 mg total) under the tongue every 5 (five) minutes as needed for chest pain. 25 tablet 3   TRADJENTA 5 MG TABS tablet Take 1 tablet (5 mg total) by mouth daily. 30 tablet 3   No current facility-administered medications for this visit.    Allergies:   Patient has no known allergies.    Social History:  The patient  reports that he has quit smoking. His smoking use included cigarettes. He has a 25.00 pack-year smoking history. He has never used smokeless tobacco. He reports that he does not drink alcohol and does not use drugs.   Family History:  The patient's  family history includes Cancer in an other family member; Coronary artery disease in his father and another family member; Diabetes in his brother; Heart attack in his brother; Heart disease in his father.    ROS:  Please see the history of present illness.   Otherwise, review of systems are positive for none.   All other systems are reviewed and negative.    PHYSICAL EXAM: VS:  BP 110/60 (BP Location: Left Arm, Patient Position: Sitting, Cuff Size: Normal)   Pulse 72   Ht 5\' 8"  (1.727 m)   Wt 168 lb 2 oz (76.3 kg)   SpO2 95%   BMI 25.56 kg/m  , BMI Body mass index is 25.56 kg/m. GEN: Well nourished, well developed, in no acute distress   HEENT: normal  Neck: no JVD, carotid bruits, or masses Cardiac: RRR with premature beats; no rubs, or gallops,no edema .  1 /6 systolic murmur in the aortic area. Respiratory:  clear to auscultation bilaterally, normal work of breathing GI: soft, nontender, nondistended, + BS MS: no deformity or atrophy  Skin: warm and dry, no rash Neuro:  Strength and sensation are intact Psych: euthymic mood, full affect   EKG:  EKG is ordered today. The ekg ordered today demonstrates sinus rhythm with first-degree AV block with PVCs.    Recent Labs: 05/01/2022: ALT 7; TSH 2.82 10/09/2022: BUN 31; Creatinine, Ser 1.69; Hemoglobin 12.7; Platelets 212; Potassium 4.4; Sodium 138    Lipid Panel    Component Value Date/Time   CHOL 130 05/01/2022 1203   CHOL 143 03/20/2017 0826   TRIG 80.0 05/01/2022 1203   HDL 48.70 05/01/2022 1203   HDL 40 03/20/2017 0826   CHOLHDL 3 05/01/2022 1203   VLDL 16.0 05/01/2022 1203   LDLCALC 65 05/01/2022 1203   LDLCALC 84 03/20/2017 0826      Wt Readings from Last 3 Encounters:  03/14/23 168 lb 2 oz (76.3 kg)  02/05/23 168 lb (76.2 kg)  11/13/22 159 lb (72.1 kg)         ASSESSMENT AND PLAN:  1.  Paroxysmal atrial flutter: No evidence of recurrent arrhythmia and heart rate seems to be controlled without medications.  He used to be on carvedilol which was discontinued due to bradycardia. Continue long-term anticoagulation with low-dose Eliquis given age and chronic kidney disease.    2. Coronary artery disease involving native coronary arteries without angina:  He reports no anginal symptoms at the present time.   He is not on a beta-blocker due to chronic bradycardia.   3. Essential hypertension: Blood pressure is controlled without medications.   4. Hyperlipidemia: I reviewed most recent lipid profile done in June which showed an LDL of 65 which is at target.  Continue atorvastatin.    Disposition:   FU with me in 6 months  Signed,  Lorine Bears, MD  03/14/2023 11:42 AM    [facility]

## 2023-03-14 NOTE — Patient Instructions (Signed)
Medication Instructions:  No changes *If you need a refill on your cardiac medications before your next appointment, please call your pharmacy*   Lab Work: None ordered If you have labs (blood work) drawn today and your tests are completely normal, you will receive your results only by: MyChart Message (if you have MyChart) OR A paper copy in the mail If you have any lab test that is abnormal or we need to change your treatment, we will call you to review the results.   Testing/Procedures: None ordered   Follow-Up: At [facility], you and your health needs are our priority.  As part of our continuing mission to provide you with exceptional heart care, we have created designated Provider Care Teams.  These Care Teams include your primary Cardiologist (physician) and Advanced Practice Providers (APPs -  Physician Assistants and Nurse Practitioners) who all work together to provide you with the care you need, when you need it.  We recommend signing up for the patient portal called "MyChart".  Sign up information is provided on this After Visit Summary.  MyChart is used to connect with patients for Virtual Visits (Telemedicine).  Patients are able to view lab/test results, encounter notes, upcoming appointments, etc.  Non-urgent messages can be sent to your provider as well.   To learn more about what you can do with MyChart, go to [email or web address].    Your next appointment:   6 month(s)  Provider:   You may see Muhammad Arida, MD or one of the following Advanced Practice Providers on your designated Care Team:   Christopher Berge, NP Ryan Dunn, PA-C Cadence Furth, PA-C Sheri Hammock, NP    

## 2023-03-19 DIAGNOSIS — Z7901 Long term (current) use of anticoagulants: Secondary | ICD-10-CM | POA: Diagnosis not present

## 2023-03-19 DIAGNOSIS — R531 Weakness: Secondary | ICD-10-CM | POA: Diagnosis not present

## 2023-03-19 DIAGNOSIS — M25511 Pain in right shoulder: Secondary | ICD-10-CM | POA: Diagnosis not present

## 2023-03-19 DIAGNOSIS — R413 Other amnesia: Secondary | ICD-10-CM | POA: Diagnosis not present

## 2023-03-19 DIAGNOSIS — R911 Solitary pulmonary nodule: Secondary | ICD-10-CM | POA: Diagnosis not present

## 2023-03-19 DIAGNOSIS — Z9181 History of falling: Secondary | ICD-10-CM | POA: Diagnosis not present

## 2023-03-19 DIAGNOSIS — E119 Type 2 diabetes mellitus without complications: Secondary | ICD-10-CM | POA: Diagnosis not present

## 2023-03-19 DIAGNOSIS — Z87891 Personal history of nicotine dependence: Secondary | ICD-10-CM | POA: Diagnosis not present

## 2023-03-19 DIAGNOSIS — J449 Chronic obstructive pulmonary disease, unspecified: Secondary | ICD-10-CM | POA: Diagnosis not present

## 2023-03-19 DIAGNOSIS — Z7984 Long term (current) use of oral hypoglycemic drugs: Secondary | ICD-10-CM | POA: Diagnosis not present

## 2023-03-19 DIAGNOSIS — I4892 Unspecified atrial flutter: Secondary | ICD-10-CM | POA: Diagnosis not present

## 2023-03-19 DIAGNOSIS — M21379 Foot drop, unspecified foot: Secondary | ICD-10-CM | POA: Diagnosis not present

## 2023-03-19 DIAGNOSIS — H9190 Unspecified hearing loss, unspecified ear: Secondary | ICD-10-CM | POA: Diagnosis not present

## 2023-03-24 ENCOUNTER — Other Ambulatory Visit: Payer: Self-pay | Admitting: Cardiovascular Disease

## 2023-03-24 ENCOUNTER — Other Ambulatory Visit: Payer: Self-pay | Admitting: Family Medicine

## 2023-03-24 DIAGNOSIS — Z7984 Long term (current) use of oral hypoglycemic drugs: Secondary | ICD-10-CM | POA: Diagnosis not present

## 2023-03-24 DIAGNOSIS — H9190 Unspecified hearing loss, unspecified ear: Secondary | ICD-10-CM | POA: Diagnosis not present

## 2023-03-24 DIAGNOSIS — J449 Chronic obstructive pulmonary disease, unspecified: Secondary | ICD-10-CM | POA: Diagnosis not present

## 2023-03-24 DIAGNOSIS — M21379 Foot drop, unspecified foot: Secondary | ICD-10-CM | POA: Diagnosis not present

## 2023-03-24 DIAGNOSIS — Z87891 Personal history of nicotine dependence: Secondary | ICD-10-CM | POA: Diagnosis not present

## 2023-03-24 DIAGNOSIS — E039 Hypothyroidism, unspecified: Secondary | ICD-10-CM

## 2023-03-24 DIAGNOSIS — I4892 Unspecified atrial flutter: Secondary | ICD-10-CM | POA: Diagnosis not present

## 2023-03-24 DIAGNOSIS — E119 Type 2 diabetes mellitus without complications: Secondary | ICD-10-CM | POA: Diagnosis not present

## 2023-03-24 DIAGNOSIS — R413 Other amnesia: Secondary | ICD-10-CM | POA: Diagnosis not present

## 2023-03-24 DIAGNOSIS — R531 Weakness: Secondary | ICD-10-CM | POA: Diagnosis not present

## 2023-03-24 DIAGNOSIS — R911 Solitary pulmonary nodule: Secondary | ICD-10-CM | POA: Diagnosis not present

## 2023-03-24 DIAGNOSIS — Z9181 History of falling: Secondary | ICD-10-CM | POA: Diagnosis not present

## 2023-03-24 DIAGNOSIS — Z7901 Long term (current) use of anticoagulants: Secondary | ICD-10-CM | POA: Diagnosis not present

## 2023-03-24 DIAGNOSIS — M25511 Pain in right shoulder: Secondary | ICD-10-CM | POA: Diagnosis not present

## 2023-03-31 DIAGNOSIS — Z7984 Long term (current) use of oral hypoglycemic drugs: Secondary | ICD-10-CM | POA: Diagnosis not present

## 2023-03-31 DIAGNOSIS — E119 Type 2 diabetes mellitus without complications: Secondary | ICD-10-CM | POA: Diagnosis not present

## 2023-03-31 DIAGNOSIS — H9190 Unspecified hearing loss, unspecified ear: Secondary | ICD-10-CM | POA: Diagnosis not present

## 2023-03-31 DIAGNOSIS — I4892 Unspecified atrial flutter: Secondary | ICD-10-CM | POA: Diagnosis not present

## 2023-03-31 DIAGNOSIS — M21379 Foot drop, unspecified foot: Secondary | ICD-10-CM | POA: Diagnosis not present

## 2023-03-31 DIAGNOSIS — M25511 Pain in right shoulder: Secondary | ICD-10-CM | POA: Diagnosis not present

## 2023-03-31 DIAGNOSIS — J449 Chronic obstructive pulmonary disease, unspecified: Secondary | ICD-10-CM | POA: Diagnosis not present

## 2023-03-31 DIAGNOSIS — Z7901 Long term (current) use of anticoagulants: Secondary | ICD-10-CM | POA: Diagnosis not present

## 2023-03-31 DIAGNOSIS — Z87891 Personal history of nicotine dependence: Secondary | ICD-10-CM | POA: Diagnosis not present

## 2023-03-31 DIAGNOSIS — R413 Other amnesia: Secondary | ICD-10-CM | POA: Diagnosis not present

## 2023-03-31 DIAGNOSIS — R911 Solitary pulmonary nodule: Secondary | ICD-10-CM | POA: Diagnosis not present

## 2023-03-31 DIAGNOSIS — Z9181 History of falling: Secondary | ICD-10-CM | POA: Diagnosis not present

## 2023-03-31 DIAGNOSIS — R531 Weakness: Secondary | ICD-10-CM | POA: Diagnosis not present

## 2023-04-09 DIAGNOSIS — Z7984 Long term (current) use of oral hypoglycemic drugs: Secondary | ICD-10-CM | POA: Diagnosis not present

## 2023-04-09 DIAGNOSIS — M21379 Foot drop, unspecified foot: Secondary | ICD-10-CM | POA: Diagnosis not present

## 2023-04-09 DIAGNOSIS — R911 Solitary pulmonary nodule: Secondary | ICD-10-CM | POA: Diagnosis not present

## 2023-04-09 DIAGNOSIS — Z9181 History of falling: Secondary | ICD-10-CM | POA: Diagnosis not present

## 2023-04-09 DIAGNOSIS — M25511 Pain in right shoulder: Secondary | ICD-10-CM | POA: Diagnosis not present

## 2023-04-09 DIAGNOSIS — Z7901 Long term (current) use of anticoagulants: Secondary | ICD-10-CM | POA: Diagnosis not present

## 2023-04-09 DIAGNOSIS — I4892 Unspecified atrial flutter: Secondary | ICD-10-CM | POA: Diagnosis not present

## 2023-04-09 DIAGNOSIS — E119 Type 2 diabetes mellitus without complications: Secondary | ICD-10-CM | POA: Diagnosis not present

## 2023-04-09 DIAGNOSIS — H9190 Unspecified hearing loss, unspecified ear: Secondary | ICD-10-CM | POA: Diagnosis not present

## 2023-04-09 DIAGNOSIS — R531 Weakness: Secondary | ICD-10-CM | POA: Diagnosis not present

## 2023-04-09 DIAGNOSIS — J449 Chronic obstructive pulmonary disease, unspecified: Secondary | ICD-10-CM | POA: Diagnosis not present

## 2023-04-09 DIAGNOSIS — R413 Other amnesia: Secondary | ICD-10-CM | POA: Diagnosis not present

## 2023-04-09 DIAGNOSIS — Z87891 Personal history of nicotine dependence: Secondary | ICD-10-CM | POA: Diagnosis not present

## 2023-04-21 DIAGNOSIS — N1832 Chronic kidney disease, stage 3b: Secondary | ICD-10-CM | POA: Diagnosis not present

## 2023-04-21 DIAGNOSIS — R600 Localized edema: Secondary | ICD-10-CM | POA: Diagnosis not present

## 2023-04-21 DIAGNOSIS — I10 Essential (primary) hypertension: Secondary | ICD-10-CM | POA: Diagnosis not present

## 2023-04-21 DIAGNOSIS — E1122 Type 2 diabetes mellitus with diabetic chronic kidney disease: Secondary | ICD-10-CM | POA: Diagnosis not present

## 2023-05-20 ENCOUNTER — Other Ambulatory Visit: Payer: Self-pay | Admitting: Pharmacist

## 2023-05-20 NOTE — Progress Notes (Unsigned)
Pharmacy Quality Measure Review  This patient is appearing on a report for being at risk of failing the adherence measure for diabetes medications this calendar year.   Medication: Tradjenta 5 mg Last fill date: 03/24/23 for 30 day supply  [facility], confirmed this fill date.  Contacted patient's primary contact, his daughter. She will call him and her mother to ask about the Tradjenta. She will call me back.   Catie Eppie Gibson, PharmD, BCACP, CPP Clinical Pharmacist [facility] [phone]

## 2023-05-21 NOTE — Progress Notes (Signed)
Care Coordination Call  Patient's daughter called me back. They realized they had not been filling Tradjenta. She notes that patient's blood sugars are generally well controlled without. They plan to refill Tradjenta and restart.   In reviewing, SGLT2 may be more beneficial than Tradjenta given ASCVD and CKD. Marcelline Deist can be initiated if eGFR is >25. However, given patient age and risk of falls if dehydration, may be appropriate to avoid. Encouraged patient to continue current regimen and talk to Dr. Birdie Sons at next appointment. Does not appear that nephrology mentioned SGLT2 at most recent last month.   Catie Eppie Gibson, PharmD, BCACP, CPP Clinical Pharmacist [facility] [phone]

## 2023-05-30 DIAGNOSIS — H35373 Puckering of macula, bilateral: Secondary | ICD-10-CM | POA: Diagnosis not present

## 2023-05-30 DIAGNOSIS — E119 Type 2 diabetes mellitus without complications: Secondary | ICD-10-CM | POA: Diagnosis not present

## 2023-05-30 DIAGNOSIS — Z961 Presence of intraocular lens: Secondary | ICD-10-CM | POA: Diagnosis not present

## 2023-05-30 DIAGNOSIS — H53469 Homonymous bilateral field defects, unspecified side: Secondary | ICD-10-CM | POA: Diagnosis not present

## 2023-06-27 ENCOUNTER — Other Ambulatory Visit: Payer: Self-pay | Admitting: Family Medicine

## 2023-08-08 ENCOUNTER — Ambulatory Visit (INDEPENDENT_AMBULATORY_CARE_PROVIDER_SITE_OTHER): Payer: Medicare HMO | Admitting: Family Medicine

## 2023-08-08 ENCOUNTER — Encounter: Payer: Self-pay | Admitting: Family Medicine

## 2023-08-08 VITALS — BP 132/80 | HR 66 | Temp 97.6°F | Ht 68.0 in | Wt 169.6 lb

## 2023-08-08 DIAGNOSIS — E785 Hyperlipidemia, unspecified: Secondary | ICD-10-CM

## 2023-08-08 DIAGNOSIS — R413 Other amnesia: Secondary | ICD-10-CM

## 2023-08-08 DIAGNOSIS — E039 Hypothyroidism, unspecified: Secondary | ICD-10-CM | POA: Diagnosis not present

## 2023-08-08 DIAGNOSIS — J449 Chronic obstructive pulmonary disease, unspecified: Secondary | ICD-10-CM | POA: Diagnosis not present

## 2023-08-08 DIAGNOSIS — Z7984 Long term (current) use of oral hypoglycemic drugs: Secondary | ICD-10-CM

## 2023-08-08 DIAGNOSIS — Z23 Encounter for immunization: Secondary | ICD-10-CM

## 2023-08-08 DIAGNOSIS — I4892 Unspecified atrial flutter: Secondary | ICD-10-CM | POA: Diagnosis not present

## 2023-08-08 DIAGNOSIS — E119 Type 2 diabetes mellitus without complications: Secondary | ICD-10-CM

## 2023-08-08 NOTE — Progress Notes (Signed)
Devin Alar, MD Phone: [phone]  Devin Reese is a 89 y.o. male who presents today for follow-up.  Atrial flutter: Patient notes no palpitations or chest pain.  No bleeding issues.  He is on Eliquis.  Diabetes: Sugars range 109-157.  He is on Tradjenta.  No polyuria or polydipsia.  He has seen [facility] sometime this past summer.  COPD: Patient's wife notes he had some cough yesterday.  Occasionally he will sit in the garage and watch deer outside.  They noted a little damp in the garage.  Notes no cough today.  He does not use his Trelegy daily.  Memory difficulty: Continues to have some issues with this.  Seems to be worse when he does not sleep well.  Notes he had 1 day where he slept poorly and it took him a little while to get his mental capacity going in the morning.  Sometimes he has trouble recognizing family members he does not see frequently.  Social History   Tobacco Use  Smoking Status Former   Current packs/day: 1.00   Average packs/day: 1 pack/day for 25.0 years (25.0 ttl pk-yrs)   Types: Cigarettes  Smokeless Tobacco Never    Current Outpatient Medications on File Prior to Visit  Medication Sig Dispense Refill   albuterol (VENTOLIN HFA) 108 (90 Base) MCG/ACT inhaler Inhale 1-2 puffs into the lungs every 4 (four) hours as needed for wheezing or shortness of breath. 1 each 0   apixaban (ELIQUIS) 2.5 MG TABS tablet Take 1 tablet (2.5 mg total) by mouth 2 (two) times daily. 60 tablet 5   bisacodyl (DULCOLAX) 5 MG EC tablet Take 5 mg by mouth daily as needed.     Fluticasone-Umeclidin-Vilant (TRELEGY ELLIPTA) 100-62.5-25 MCG/ACT AEPB Inhale 1 puff into the lungs daily. 60 each 3   levothyroxine (SYNTHROID) 25 MCG tablet Take 1 tablet (25 mcg total) by mouth daily before breakfast. 90 tablet 1   linagliptin (TRADJENTA) 5 MG TABS tablet Take 1 tablet (5 mg total) by mouth daily. 90 tablet 1   nitroGLYCERIN (NITROSTAT) 0.4 MG SL tablet Place 1 tablet  (0.4 mg total) under the tongue every 5 (five) minutes as needed for chest pain. 25 tablet 3   atorvastatin (LIPITOR) 40 MG tablet Take 1 tablet (40 mg total) by mouth daily. 90 tablet 3   No current facility-administered medications on file prior to visit.     ROS see history of present illness  Objective  Physical Exam Vitals:   08/08/23 1332  BP: 132/80  Pulse: 66  Temp: 97.6 F (36.4 C)  SpO2: 97%    BP Readings from Last 3 Encounters:  08/08/23 132/80  03/14/23 110/60  02/05/23 128/76   Wt Readings from Last 3 Encounters:  08/08/23 169 lb 9.6 oz (76.9 kg)  03/14/23 168 lb 2 oz (76.3 kg)  02/05/23 168 lb (76.2 kg)    Physical Exam Constitutional:      General: He is not in acute distress.    Appearance: He is not diaphoretic.  Cardiovascular:     Rate and Rhythm: Normal rate and regular rhythm.     Heart sounds: Normal heart sounds.  Pulmonary:     Effort: Pulmonary effort is normal.     Breath sounds: Normal breath sounds.  Skin:    General: Skin is warm and dry.  Neurological:     Mental Status: He is alert.      Assessment/Plan: Please see individual problem list.  Atrial flutter, unspecified  type [facility]) Assessment & Plan: Chronic issue.  Rate controlled.  He will continue Eliquis 2.5 mg twice daily.   Chronic obstructive pulmonary disease, unspecified COPD type (HCC) Assessment & Plan: Chronic issue.  Encouraged him to use his Trelegy 1 puff daily.   Type 2 diabetes mellitus without complication, without long-term current use of insulin (HCC) Assessment & Plan: Chronic issue.  Adequately controlled home CBGs.  Patient will continue Tradjenta 5 mg daily.  Check A1c.  Orders: -     Hemoglobin A1c  Hypothyroidism, unspecified type Assessment & Plan: Check TSH.  Continue Synthroid 25 mcg daily.  Orders: -     TSH  Hyperlipidemia, unspecified hyperlipidemia type Assessment & Plan: Chronic issue.  Continue Lipitor 40 mg daily.  Check  labs.  Orders: -     Comprehensive metabolic panel -     Lipid panel  Memory difficulty Assessment & Plan: Chronic issue.  They will monitor.  Discussed medication management of dementia though I am not sure there is much benefit to this.  They will monitor his sleep and if his sleep becomes more of an issue we could trial trazodone.    Return in about 6 months (around 02/05/2024).   Devin Alar, MD [facility]

## 2023-08-08 NOTE — Assessment & Plan Note (Signed)
Chronic issue.  Continue Lipitor 40 mg daily.  Check labs.

## 2023-08-08 NOTE — Assessment & Plan Note (Signed)
Check TSH.  Continue Synthroid 25 mcg daily.

## 2023-08-08 NOTE — Assessment & Plan Note (Signed)
Chronic issue.  They will monitor.  Discussed medication management of dementia though I am not sure there is much benefit to this.  They will monitor his sleep and if his sleep becomes more of an issue we could trial trazodone.

## 2023-08-08 NOTE — Assessment & Plan Note (Signed)
Chronic issue.  Rate controlled.  He will continue Eliquis 2.5 mg twice daily.

## 2023-08-08 NOTE — Patient Instructions (Signed)
Nice to see you. Please use your Trelegy inhaler daily. Will get lab work today and contact you with the results.

## 2023-08-08 NOTE — Assessment & Plan Note (Signed)
Chronic issue.  Adequately controlled home CBGs.  Patient will continue Tradjenta 5 mg daily.  Check A1c.

## 2023-08-08 NOTE — Assessment & Plan Note (Signed)
Chronic issue.  Encouraged him to use his Trelegy 1 puff daily.

## 2023-08-09 LAB — COMPREHENSIVE METABOLIC PANEL
AG Ratio: 1.4 (calc) (ref 1.0–2.5)
ALT: 5 U/L — ABNORMAL LOW (ref 9–46)
AST: 12 U/L (ref 10–35)
Albumin: 3.7 g/dL (ref 3.6–5.1)
Alkaline phosphatase (APISO): 79 U/L (ref 35–144)
BUN/Creatinine Ratio: 12 (calc) (ref 6–22)
BUN: 24 mg/dL (ref 7–25)
CO2: 23 mmol/L (ref 20–32)
Calcium: 8.9 mg/dL (ref 8.6–10.3)
Chloride: 103 mmol/L (ref 98–110)
Creat: 1.93 mg/dL — ABNORMAL HIGH (ref 0.70–1.22)
Globulin: 2.7 g/dL (calc) (ref 1.9–3.7)
Glucose, Bld: 113 mg/dL — ABNORMAL HIGH (ref 65–99)
Potassium: 4.5 mmol/L (ref 3.5–5.3)
Sodium: 138 mmol/L (ref 135–146)
Total Bilirubin: 0.8 mg/dL (ref 0.2–1.2)
Total Protein: 6.4 g/dL (ref 6.1–8.1)

## 2023-08-09 LAB — LIPID PANEL
Cholesterol: 161 mg/dL (ref <200)
HDL: 53 mg/dL (ref >=40)
LDL Cholesterol (Calc): 91 mg/dL (calc)
Non-HDL Cholesterol (Calc): 108 mg/dL (calc) (ref <130)
Total CHOL/HDL Ratio: 3 (calc) (ref <5.0)
Triglycerides: 77 mg/dL (ref <150)

## 2023-08-09 LAB — TSH: TSH: 1.86 mIU/L (ref 0.40–4.50)

## 2023-08-09 LAB — HEMOGLOBIN A1C
Hgb A1c MFr Bld: 7.3 % of total Hgb — ABNORMAL HIGH (ref <5.7)
Mean Plasma Glucose: 163 mg/dL
eAG (mmol/L): 9 mmol/L

## 2023-08-13 ENCOUNTER — Other Ambulatory Visit: Payer: Self-pay | Admitting: Family Medicine

## 2023-08-13 DIAGNOSIS — E039 Hypothyroidism, unspecified: Secondary | ICD-10-CM

## 2023-08-18 ENCOUNTER — Telehealth: Payer: Self-pay | Admitting: Family Medicine

## 2023-08-18 NOTE — Telephone Encounter (Signed)
Spoke with Patient's daughter and figured out what the CT chest from 4/24 was for. Daughter will call to reschedule that CT due to Patient not having a ride that day.

## 2023-08-18 NOTE — Addendum Note (Signed)
Addended by: Warden Fillers on: 08/18/2023 08:50 AM   Modules accepted: Orders

## 2023-08-18 NOTE — Telephone Encounter (Signed)
Pt daughter would like to be called concerning a CT scan

## 2023-08-20 ENCOUNTER — Ambulatory Visit: Payer: Medicare HMO

## 2023-08-28 ENCOUNTER — Ambulatory Visit
Admission: RE | Admit: 2023-08-28 | Discharge: 2023-08-28 | Disposition: A | Discharge status: Home / Self Care | Payer: Medicare HMO | Source: Ambulatory Visit | Attending: Family Medicine | Admitting: Family Medicine

## 2023-08-28 DIAGNOSIS — R918 Other nonspecific abnormal finding of lung field: Secondary | ICD-10-CM | POA: Diagnosis not present

## 2023-08-28 DIAGNOSIS — J929 Pleural plaque without asbestos: Secondary | ICD-10-CM | POA: Insufficient documentation

## 2023-08-28 DIAGNOSIS — I700 Atherosclerosis of aorta: Secondary | ICD-10-CM | POA: Diagnosis not present

## 2023-08-28 DIAGNOSIS — J984 Other disorders of lung: Secondary | ICD-10-CM | POA: Diagnosis not present

## 2023-09-23 ENCOUNTER — Telehealth: Payer: Self-pay

## 2023-09-23 NOTE — Telephone Encounter (Signed)
Pt needs a toc to Dr. Clent Ridges for around March.  Please call daughter in regards to appt

## 2023-10-21 DIAGNOSIS — D2261 Melanocytic nevi of right upper limb, including shoulder: Secondary | ICD-10-CM | POA: Diagnosis not present

## 2023-10-21 DIAGNOSIS — C44629 Squamous cell carcinoma of skin of left upper limb, including shoulder: Secondary | ICD-10-CM | POA: Diagnosis not present

## 2023-10-21 DIAGNOSIS — L821 Other seborrheic keratosis: Secondary | ICD-10-CM | POA: Diagnosis not present

## 2023-10-21 DIAGNOSIS — D485 Neoplasm of uncertain behavior of skin: Secondary | ICD-10-CM | POA: Diagnosis not present

## 2023-10-21 DIAGNOSIS — D225 Melanocytic nevi of trunk: Secondary | ICD-10-CM | POA: Diagnosis not present

## 2023-10-21 DIAGNOSIS — D2271 Melanocytic nevi of right lower limb, including hip: Secondary | ICD-10-CM | POA: Diagnosis not present

## 2023-10-21 DIAGNOSIS — D2262 Melanocytic nevi of left upper limb, including shoulder: Secondary | ICD-10-CM | POA: Diagnosis not present

## 2023-10-21 DIAGNOSIS — L570 Actinic keratosis: Secondary | ICD-10-CM | POA: Diagnosis not present

## 2023-10-21 DIAGNOSIS — L858 Other specified epidermal thickening: Secondary | ICD-10-CM | POA: Diagnosis not present

## 2023-10-21 DIAGNOSIS — D2272 Melanocytic nevi of left lower limb, including hip: Secondary | ICD-10-CM | POA: Diagnosis not present

## 2023-10-21 DIAGNOSIS — Z85828 Personal history of other malignant neoplasm of skin: Secondary | ICD-10-CM | POA: Diagnosis not present

## 2023-10-21 DIAGNOSIS — Z08 Encounter for follow-up examination after completed treatment for malignant neoplasm: Secondary | ICD-10-CM | POA: Diagnosis not present

## 2023-10-22 DIAGNOSIS — I10 Essential (primary) hypertension: Secondary | ICD-10-CM | POA: Diagnosis not present

## 2023-10-22 DIAGNOSIS — N1832 Chronic kidney disease, stage 3b: Secondary | ICD-10-CM | POA: Diagnosis not present

## 2023-10-22 DIAGNOSIS — R600 Localized edema: Secondary | ICD-10-CM | POA: Diagnosis not present

## 2023-10-22 DIAGNOSIS — E1122 Type 2 diabetes mellitus with diabetic chronic kidney disease: Secondary | ICD-10-CM | POA: Diagnosis not present

## 2023-11-27 DIAGNOSIS — C44629 Squamous cell carcinoma of skin of left upper limb, including shoulder: Secondary | ICD-10-CM | POA: Diagnosis not present

## 2023-12-18 ENCOUNTER — Ambulatory Visit: Payer: Self-pay | Admitting: Family Medicine

## 2023-12-18 NOTE — Telephone Encounter (Signed)
Copied from CRM [id number]. Topic: Clinical - Red Word Triage >> Dec 18, 2023  8:57 AM Efraim Kaufmann C wrote: [facility] that prompted transfer to Nurse Triage: patient fell and hit his head on the dresser this morning. Patient's daughter is concerned since he is on Eloquis and wanted to speak with someone  Chief Complaint: Fall Symptoms: No symptoms Frequency: This morning Pertinent Negatives: Patient denies pain, bleeding, swelling, bruising and neurological symptoms Disposition: [] ED /[] Urgent Care (no appt availability in office) / [] Appointment(In office/virtual)/ []  [facility]/ [x] Home Care/ [] Refused Recommended Disposition /[] [facility]/ []  Follow-up with PCP Additional Notes: Spoke to patient's daughter, Rosey Bath, on behalf of her father who had a minor fall this morning. Daughter stated that the patient's jaw/face made contact with his dresser when he was getting out of bed. Daughter stated that she was not there at the time of the fall, but arrived at the house shortly after it happened. Patient stated that he is not experiencing any pain. Daughter assessed her father while on the phone with this RN and denied bruising, swelling, and bleeding. Daughter also denied neurological symptoms in the patient such as weakness and disorientation. Daughter expressed concern because her father is on Eliquis. This RN advised home care at this time. This RN advised daughter to monitor the patient closely and call back immediately if she notices bruising, bleeding, or a change in the patient's neurological status. Daughter/patient complied.    Reason for Disposition  [1] Recent fall AND [2] no injury  Answer Assessment - Initial Assessment Questions 1. MECHANISM: "How did the fall happen?"     Fell and hit the dresser 3. ONSET: "When did the fall happen?" (e.g., minutes, hours, or days ago)     20-25 minutes prior to this call 4. LOCATION: "What part of the body hit the ground?" (e.g.,  back, buttocks, head, hips, knees, hands, head, stomach)     Jaw/face area  5. INJURY: "Did you hurt (injure) yourself when you fell?" If Yes, ask: "What did you injure? Tell me more about this?" (e.g., body area; type of injury; pain severity)"     Patient denies pain 6. PAIN: "Is there any pain?" If Yes, ask: "How bad is the pain?" (e.g., Scale 1-10; or mild,  moderate, severe)   - NONE (0): No pain   - MILD (1-3): Doesn't interfere with normal activities    - MODERATE (4-7): Interferes with normal activities or awakens from sleep    - SEVERE (8-10): Excruciating pain, unable to do any normal activities      Patient denies pain 7. SIZE: For cuts, bruises, or swelling, ask: "How large is it?" (e.g., inches or centimeters)      Denies bruising, swelling, and broken skin 9. OTHER SYMPTOMS: "Do you have any other symptoms?" (e.g., dizziness, fever, weakness; new onset or worsening).      Daughter denies disorientation and dizziness 10. CAUSE: "What do you think caused the fall (or falling)?" (e.g., tripped, dizzy spell)     Daughter denies cause, states her father uses a walker  Protocols used: Falls and [facility]

## 2023-12-18 NOTE — Telephone Encounter (Signed)
Spoke with daughter & advised her to take him to the ER. Daughter states that they will be there all night long but she agreed to take him.

## 2023-12-19 ENCOUNTER — Ambulatory Visit (INDEPENDENT_AMBULATORY_CARE_PROVIDER_SITE_OTHER): Payer: Medicare HMO | Admitting: Nurse Practitioner

## 2023-12-19 ENCOUNTER — Ambulatory Visit: Payer: Medicare HMO | Admitting: General Practice

## 2023-12-19 ENCOUNTER — Ambulatory Visit
Admission: RE | Admit: 2023-12-19 | Discharge: 2023-12-19 | Disposition: A | Discharge status: Home / Self Care | Payer: Medicare HMO | Source: Ambulatory Visit | Attending: Nurse Practitioner | Admitting: Nurse Practitioner

## 2023-12-19 VITALS — BP 116/70 | HR 56 | Ht 68.0 in | Wt 156.7 lb

## 2023-12-19 DIAGNOSIS — W19XXXA Unspecified fall, initial encounter: Secondary | ICD-10-CM | POA: Diagnosis not present

## 2023-12-19 DIAGNOSIS — I672 Cerebral atherosclerosis: Secondary | ICD-10-CM | POA: Diagnosis not present

## 2023-12-19 DIAGNOSIS — S0990XA Unspecified injury of head, initial encounter: Secondary | ICD-10-CM | POA: Diagnosis not present

## 2023-12-19 DIAGNOSIS — Y92009 Unspecified place in unspecified non-institutional (private) residence as the place of occurrence of the external cause: Secondary | ICD-10-CM | POA: Insufficient documentation

## 2023-12-19 DIAGNOSIS — G9389 Other specified disorders of brain: Secondary | ICD-10-CM | POA: Diagnosis not present

## 2023-12-19 DIAGNOSIS — I6782 Cerebral ischemia: Secondary | ICD-10-CM | POA: Diagnosis not present

## 2023-12-19 NOTE — Telephone Encounter (Signed)
Called pt daughter and she agreed to take pt to [facility] at 3:40 with Evelene Croon today to have dad checked out.

## 2023-12-19 NOTE — Progress Notes (Signed)
Bethanie Dicker, NP-C Phone: [phone]  Devin Reese is a 89 y.o. male who presents today for fall at home.   Discussed the use of AI scribe software for clinical note transcription with the patient, who gave verbal consent to proceed.  History of Present Illness   The patient presents with a recent fall and potential head injury. He is accompanied by his daughter and wife.  He experienced a fall yesterday morning around 8 AM, stepping in something and possibly hitting his face on a dresser. He was sitting on the bed and fell, potentially moving his walker in the process. He is unsure if he hit his head but thinks he might have hit his face. No head pain, dizziness, or changes in behavior following the fall. He did sleep more than usual, attributed to not being disturbed by his caregiver/daughter.  He is currently on blood thinners, which raises concern for potential complications from the fall. No stomach pain, nausea, or visual disturbances. He reports that his vision is 'better than it did' and was able to direct his caregiver while driving.  In the past, he had a fall approximately five years ago, resulting in a shoulder injury that has not fully recovered despite therapy. This is a chronic issue with no acute changes noted during this visit.  He recently had a dermatological procedure with stitches removed a week ago, which appears to have healed well.      Social History   Tobacco Use  Smoking Status Former   Current packs/day: 1.00   Average packs/day: 1 pack/day for 25.0 years (25.0 ttl pk-yrs)   Types: Cigarettes  Smokeless Tobacco Never    Current Outpatient Medications on File Prior to Visit  Medication Sig Dispense Refill   albuterol (VENTOLIN HFA) 108 (90 Base) MCG/ACT inhaler Inhale 1-2 puffs into the lungs every 4 (four) hours as needed for wheezing or shortness of breath. 1 each 0   apixaban (ELIQUIS) 2.5 MG TABS tablet Take 1 tablet (2.5 mg total) by mouth 2  (two) times daily. 60 tablet 5   atorvastatin (LIPITOR) 40 MG tablet Take 1 tablet (40 mg total) by mouth daily. 90 tablet 3   bisacodyl (DULCOLAX) 5 MG EC tablet Take 5 mg by mouth daily as needed.     Fluticasone-Umeclidin-Vilant (TRELEGY ELLIPTA) 100-62.5-25 MCG/ACT AEPB Inhale 1 puff into the lungs daily. 60 each 3   levothyroxine (SYNTHROID) 25 MCG tablet Take 1 tablet (25 mcg total) by mouth daily before breakfast. 90 tablet 1   linagliptin (TRADJENTA) 5 MG TABS tablet Take 1 tablet (5 mg total) by mouth daily. 90 tablet 1   nitroGLYCERIN (NITROSTAT) 0.4 MG SL tablet Place 1 tablet (0.4 mg total) under the tongue every 5 (five) minutes as needed for chest pain. 25 tablet 3   No current facility-administered medications on file prior to visit.    ROS see history of present illness  Objective  Physical Exam Vitals:   12/19/23 1452  BP: 116/70  Pulse: (!) 56  SpO2: 99%    BP Readings from Last 3 Encounters:  12/19/23 116/70  08/08/23 132/80  03/14/23 110/60   Wt Readings from Last 3 Encounters:  12/19/23 156 lb 11.2 oz (71.1 kg)  08/08/23 169 lb 9.6 oz (76.9 kg)  03/14/23 168 lb 2 oz (76.3 kg)    Physical Exam Constitutional:      General: He is not in acute distress.    Appearance: Normal appearance.  HENT:  Head: Normocephalic.     Comments: No signs of injury present on head or face.  Eyes:     Extraocular Movements: Extraocular movements intact.     Conjunctiva/sclera: Conjunctivae normal.     Pupils: Pupils are equal, round, and reactive to light.  Cardiovascular:     Rate and Rhythm: Normal rate and regular rhythm.     Heart sounds: Normal heart sounds.  Pulmonary:     Effort: Pulmonary effort is normal.     Breath sounds: Normal breath sounds.  Skin:    General: Skin is warm and dry.  Neurological:     General: No focal deficit present.     Mental Status: He is alert. Mental status is at baseline.     Cranial Nerves: No cranial nerve deficit,  dysarthria or facial asymmetry.     Motor: No weakness.  Psychiatric:        Mood and Affect: Mood normal.        Behavior: Behavior normal.    Assessment/Plan: Please see individual problem list.  Fall in home, initial encounter Assessment & Plan: The patient fell yesterday morning, possibly hitting his face. There was no clear loss of consciousness, headache, nausea, vision changes, or behavioral changes. No signs of injury noted on exam. Neuro exam WNL. However, being on blood thinners increases the risk of intracranial bleeding. A head CT scan is ordered to rule out intracranial bleeding due to the fall. Results will be communicated by the on-call doctor or the provider if any abnormalities are found. Strict precautions given to patient and family.   Orders: -     CT HEAD WO CONTRAST ( ); Future  Injury of head, initial encounter -     CT HEAD WO CONTRAST ( ); Future    Return if symptoms worsen or fail to improve.   Bethanie Dicker, NP-C [facility]

## 2023-12-19 NOTE — Assessment & Plan Note (Addendum)
The patient fell yesterday morning, possibly hitting his face. There was no clear loss of consciousness, headache, nausea, vision changes, or behavioral changes. No signs of injury noted on exam. Neuro exam WNL. However, being on blood thinners increases the risk of intracranial bleeding. A head CT scan is ordered to rule out intracranial bleeding due to the fall. Results will be communicated by the on-call doctor or the provider if any abnormalities are found. Strict precautions given to patient and family.

## 2023-12-19 NOTE — Telephone Encounter (Signed)
Pt seen in office by Bethanie Dicker

## 2023-12-19 NOTE — Telephone Encounter (Signed)
After reviewing chart this morning I do not see where patient went to the ED yesterday afternoon.  Please call and check on the patient and encourage them to go to the ED to be evaluated. Or schedule an appt in office today with anyone.

## 2023-12-19 NOTE — Telephone Encounter (Signed)
Called daughter to follow up on pt but vm is full unable to leave a message to call the office back.

## 2023-12-19 NOTE — Telephone Encounter (Signed)
Called pt wife and they went that to ER and they told them that they could go to UC, and they went and UC said they could not do scan. So they went back to ER and there was 30 people in front of them.

## 2023-12-26 ENCOUNTER — Telehealth: Payer: Self-pay

## 2023-12-26 ENCOUNTER — Ambulatory Visit (INDEPENDENT_AMBULATORY_CARE_PROVIDER_SITE_OTHER): Payer: Medicare HMO | Admitting: Family Medicine

## 2023-12-26 VITALS — BP 118/62 | HR 69 | Ht 68.0 in | Wt 159.0 lb

## 2023-12-26 DIAGNOSIS — L989 Disorder of the skin and subcutaneous tissue, unspecified: Secondary | ICD-10-CM | POA: Diagnosis not present

## 2023-12-26 DIAGNOSIS — M899 Disorder of bone, unspecified: Secondary | ICD-10-CM

## 2023-12-26 NOTE — Telephone Encounter (Signed)
 Noted and forwarded to [facility] Referral Pool.

## 2023-12-26 NOTE — Progress Notes (Signed)
  Camellia Her, MD Phone: [phone]  Devin Reese is a 89 y.o. male who presents today for follow-up.  Skull lesion: Found on recent CT imaging.  Reports dated one of the lesions have been seen previously though to prior CT scan reports reviewed do not mention this.  Patient also reports he had a spot removed from his left arm by dermatology.  There appears to be some scab/scar formation.  Social History   Tobacco Use  Smoking Status Former   Current packs/day: 1.00   Average packs/day: 1 pack/day for 25.0 years (25.0 ttl pk-yrs)   Types: Cigarettes  Smokeless Tobacco Never    Current Outpatient Medications on File Prior to Visit  Medication Sig Dispense Refill   albuterol  (VENTOLIN  HFA) 108 (90 Base) MCG/ACT inhaler Inhale 1-2 puffs into the lungs every 4 (four) hours as needed for wheezing or shortness of breath. 1 each 0   apixaban  (ELIQUIS ) 2.5 MG TABS tablet Take 1 tablet (2.5 mg total) by mouth 2 (two) times daily. 60 tablet 5   bisacodyl (DULCOLAX) 5 MG EC tablet Take 5 mg by mouth daily as needed.     Fluticasone-Umeclidin-Vilant (TRELEGY ELLIPTA ) 100-62.5-25 MCG/ACT AEPB Inhale 1 puff into the lungs daily. 60 each 3   levothyroxine  (SYNTHROID ) 25 MCG tablet Take 1 tablet (25 mcg total) by mouth daily before breakfast. 90 tablet 1   linagliptin  (TRADJENTA ) 5 MG TABS tablet Take 1 tablet (5 mg total) by mouth daily. 90 tablet 1   nitroGLYCERIN  (NITROSTAT ) 0.4 MG SL tablet Place 1 tablet (0.4 mg total) under the tongue every 5 (five) minutes as needed for chest pain. 25 tablet 3   atorvastatin  (LIPITOR) 40 MG tablet Take 1 tablet (40 mg total) by mouth daily. 90 tablet 3   No current facility-administered medications on file prior to visit.     ROS see history of present illness  Objective  Physical Exam Vitals:   12/26/23 1354  BP: 118/62  Pulse: 69  SpO2: 98%    BP Readings from Last 3 Encounters:  12/26/23 118/62  12/19/23 116/70  08/08/23  132/80   Wt Readings from Last 3 Encounters:  12/26/23 159 lb (72.1 kg)  12/19/23 156 lb 11.2 oz (71.1 kg)  08/08/23 169 lb 9.6 oz (76.9 kg)    Physical Exam Constitutional:      General: He is not in acute distress. Neurological:     Mental Status: He is alert.     Left forearm, nontender, no fluctuance  Assessment/Plan: Please see individual problem list.  Skull lesion Assessment & Plan: Discussed concern for metastatic disease versus multiple myeloma.  Discussed there is the potential it could be a benign cause.  Discussed the need for MRI imaging to evaluate further and hematology referral given concern for cancerous process.  Will check a BMP today to evaluate his GFR to determine if we can give contrast with his MRI.  Orders: -     BASIC METABOLIC PANEL WITH eGFR -     Ambulatory referral to Hematology / Oncology  Skin lesion Assessment & Plan: Area appears to be a scab versus scar formation.  No apparent infection.  They will monitor.  Discussed he could use Vaseline on this.      Return for As scheduled.   Camellia Her, MD [facility]

## 2023-12-26 NOTE — Telephone Encounter (Signed)
 Patient would like for us  to please contact his daughter, Rhoderick Ceo at [phone], to schedule his MRI.

## 2023-12-26 NOTE — Assessment & Plan Note (Signed)
 Area appears to be a scab versus scar formation.  No apparent infection.  They will monitor.  Discussed he could use Vaseline on this.

## 2023-12-26 NOTE — Assessment & Plan Note (Signed)
 Discussed concern for metastatic disease versus multiple myeloma.  Discussed there is the potential it could be a benign cause.  Discussed the need for MRI imaging to evaluate further and hematology referral given concern for cancerous process.  Will check a BMP today to evaluate his GFR to determine if we can give contrast with his MRI.

## 2023-12-27 LAB — BASIC METABOLIC PANEL WITH GFR
BUN/Creatinine Ratio: 12 (calc) (ref 6–22)
BUN: 23 mg/dL (ref 7–25)
CO2: 21 mmol/L (ref 20–32)
Calcium: 9 mg/dL (ref 8.6–10.3)
Chloride: 103 mmol/L (ref 98–110)
Creat: 1.96 mg/dL — ABNORMAL HIGH (ref 0.70–1.22)
Glucose, Bld: 134 mg/dL — ABNORMAL HIGH (ref 65–99)
Potassium: 4.4 mmol/L (ref 3.5–5.3)
Sodium: 135 mmol/L (ref 135–146)
eGFR: 31 mL/min/{1.73_m2} — ABNORMAL LOW (ref >=60)

## 2023-12-29 ENCOUNTER — Telehealth: Payer: Self-pay | Admitting: Family Medicine

## 2023-12-29 NOTE — Telephone Encounter (Signed)
 I spoke with pt daughter Mrs Alecia Ames. She would like to know her dad lab result and she has a question about a test being done to check for metals in pt body before MRI. Please advise and Thank you!  Pt daughter number is [phone]

## 2023-12-29 NOTE — Addendum Note (Signed)
 Addended by: Kent Pear on: 12/29/2023 08:35 AM   Modules accepted: Orders

## 2023-12-29 NOTE — Telephone Encounter (Signed)
 I was waiting on the patient's lab work to return.  His GFR is greater than 30.  I have ordered the appropriate MRI.

## 2023-12-30 ENCOUNTER — Telehealth: Payer: Self-pay

## 2023-12-30 NOTE — Telephone Encounter (Signed)
Copied from CRM [id number]. Topic: Clinical - Lab/Test Results >> Dec 30, 2023  4:01 PM Devin Reese wrote: Reason for CRM: Patient is requesting a call back from Sandy Salaam to go over patients lab results from 2/7 please call her back at [phone]

## 2023-12-30 NOTE — Telephone Encounter (Signed)
noted

## 2023-12-30 NOTE — Telephone Encounter (Signed)
LMTCB with pt daughter, Mrs. Amada Jupiter.

## 2023-12-30 NOTE — Telephone Encounter (Signed)
Spoke with Donetta Potts to inform her someone from the clinical staff will reach out once lab results have been received for the patient.

## 2023-12-31 ENCOUNTER — Ambulatory Visit: Payer: Medicare HMO | Admitting: Oncology

## 2023-12-31 NOTE — Telephone Encounter (Signed)
Spoke with patient's daughter to make her aware of patient's recent lab results. Donetta Potts asked if her patient was OK to have MRI. Per Dr Birdie Sons, says as long as there has not been any changes patient should be fine to proceed with MRI, as he had one back in 2023. Terressa verbalized understanding and has no further questions.

## 2024-01-05 ENCOUNTER — Ambulatory Visit
Admission: RE | Admit: 2024-01-05 | Discharge: 2024-01-05 | Disposition: A | Discharge status: Home / Self Care | Payer: Medicare HMO | Source: Ambulatory Visit | Attending: Family Medicine | Admitting: Family Medicine

## 2024-01-05 DIAGNOSIS — M899 Disorder of bone, unspecified: Secondary | ICD-10-CM | POA: Diagnosis not present

## 2024-01-05 DIAGNOSIS — R9089 Other abnormal findings on diagnostic imaging of central nervous system: Secondary | ICD-10-CM | POA: Diagnosis not present

## 2024-01-05 MED ORDER — GADOBUTROL 1 MMOL/ML IV SOLN
7.0000 mL | Freq: Once | INTRAVENOUS | Status: AC | PRN
  Administered 2024-01-05: 7 mL via INTRAVENOUS

## 2024-01-09 ENCOUNTER — Inpatient Hospital Stay: Payer: Medicare HMO | Admitting: Oncology

## 2024-01-09 ENCOUNTER — Inpatient Hospital Stay: Payer: Medicare HMO

## 2024-01-15 ENCOUNTER — Telehealth: Payer: Self-pay

## 2024-01-15 NOTE — Telephone Encounter (Signed)
 Copied from CRM [id number]. Topic: Clinical - Lab/Test Results >> Jan 15, 2024  9:33 AM Orinda Kenner C wrote: Reason for CRM: Patient's child Rosey Bath [phone] wanting MRI results. Please call back.

## 2024-01-16 ENCOUNTER — Inpatient Hospital Stay: Payer: Medicare HMO

## 2024-01-16 ENCOUNTER — Inpatient Hospital Stay: Payer: Medicare HMO | Admitting: Oncology

## 2024-01-16 NOTE — Telephone Encounter (Signed)
 Called Devin Reese to let her know that MRI results are not back yet.  She verbalized understanding.

## 2024-01-27 ENCOUNTER — Telehealth: Payer: Self-pay

## 2024-01-27 NOTE — Telephone Encounter (Signed)
 Copied from CRM [id number]. Topic: Clinical - Lab/Test Results >> Jan 27, 2024 12:59 PM Sim Boast F wrote: Reason for CRM: Patient daughter Rosey Bath requesting call back today regarding patients MRI results

## 2024-01-28 ENCOUNTER — Encounter: Payer: Self-pay | Admitting: Oncology

## 2024-01-28 ENCOUNTER — Inpatient Hospital Stay: Payer: Medicare HMO

## 2024-01-28 ENCOUNTER — Inpatient Hospital Stay: Payer: Medicare HMO | Attending: Oncology | Admitting: Oncology

## 2024-01-28 ENCOUNTER — Telehealth: Payer: Self-pay | Admitting: Family Medicine

## 2024-01-28 VITALS — BP 116/68 | HR 90 | Temp 96.8°F | Resp 14 | Wt 160.0 lb

## 2024-01-28 DIAGNOSIS — N182 Chronic kidney disease, stage 2 (mild): Secondary | ICD-10-CM | POA: Diagnosis not present

## 2024-01-28 DIAGNOSIS — Z87891 Personal history of nicotine dependence: Secondary | ICD-10-CM | POA: Insufficient documentation

## 2024-01-28 DIAGNOSIS — D649 Anemia, unspecified: Secondary | ICD-10-CM | POA: Diagnosis not present

## 2024-01-28 DIAGNOSIS — M899 Disorder of bone, unspecified: Secondary | ICD-10-CM

## 2024-01-28 DIAGNOSIS — Z8673 Personal history of transient ischemic attack (TIA), and cerebral infarction without residual deficits: Secondary | ICD-10-CM | POA: Diagnosis not present

## 2024-01-28 DIAGNOSIS — Z79899 Other long term (current) drug therapy: Secondary | ICD-10-CM | POA: Insufficient documentation

## 2024-01-28 DIAGNOSIS — J449 Chronic obstructive pulmonary disease, unspecified: Secondary | ICD-10-CM | POA: Insufficient documentation

## 2024-01-28 DIAGNOSIS — I129 Hypertensive chronic kidney disease with stage 1 through stage 4 chronic kidney disease, or unspecified chronic kidney disease: Secondary | ICD-10-CM | POA: Diagnosis not present

## 2024-01-28 DIAGNOSIS — Z8546 Personal history of malignant neoplasm of prostate: Secondary | ICD-10-CM | POA: Insufficient documentation

## 2024-01-28 DIAGNOSIS — Z7984 Long term (current) use of oral hypoglycemic drugs: Secondary | ICD-10-CM | POA: Diagnosis not present

## 2024-01-28 DIAGNOSIS — E871 Hypo-osmolality and hyponatremia: Secondary | ICD-10-CM | POA: Diagnosis not present

## 2024-01-28 DIAGNOSIS — E785 Hyperlipidemia, unspecified: Secondary | ICD-10-CM | POA: Diagnosis not present

## 2024-01-28 DIAGNOSIS — E1122 Type 2 diabetes mellitus with diabetic chronic kidney disease: Secondary | ICD-10-CM | POA: Diagnosis not present

## 2024-01-28 LAB — CBC (CANCER CENTER ONLY)
HCT: 38.9 % — ABNORMAL LOW (ref 39.0–52.0)
Hemoglobin: 12.9 g/dL — ABNORMAL LOW (ref 13.0–17.0)
MCH: 31.9 pg (ref 26.0–34.0)
MCHC: 33.2 g/dL (ref 30.0–36.0)
MCV: 96 fL (ref 80.0–100.0)
Platelet Count: 203 10*3/uL (ref 150–400)
RBC: 4.05 MIL/uL — ABNORMAL LOW (ref 4.22–5.81)
RDW: 13.5 % (ref 11.5–15.5)
WBC Count: 4.5 10*3/uL (ref 4.0–10.5)
nRBC: 0 % (ref 0.0–0.2)

## 2024-01-28 LAB — BASIC METABOLIC PANEL - CANCER CENTER ONLY
Anion gap: 7 (ref 5–15)
BUN: 30 mg/dL — ABNORMAL HIGH (ref 8–23)
CO2: 25 mmol/L (ref 22–32)
Calcium: 8.7 mg/dL — ABNORMAL LOW (ref 8.9–10.3)
Chloride: 100 mmol/L (ref 98–111)
Creatinine: 2.38 mg/dL — ABNORMAL HIGH (ref 0.61–1.24)
GFR, Estimated: 24 mL/min — ABNORMAL LOW (ref >60)
Glucose, Bld: 195 mg/dL — ABNORMAL HIGH (ref 70–99)
Potassium: 3.9 mmol/L (ref 3.5–5.1)
Sodium: 132 mmol/L — ABNORMAL LOW (ref 135–145)

## 2024-01-28 LAB — PSA: Prostatic Specific Antigen: 0.38 ng/mL (ref 0.00–4.00)

## 2024-01-28 NOTE — Telephone Encounter (Signed)
 Dr Clent Ridges is leaving the practice and your Transfer of Care needs to be rescheduled with another provider. Please call the office to schedule a Transfer of Care to either Dr Charlann Lange, Darleen Crocker or Kara Dies, NP.   Thank you

## 2024-01-28 NOTE — Progress Notes (Signed)
 [facility]  [phone] [phone]  ID: Devin Reese OB: 1927-05-13  MR#: [id number]  AOZ#:[id number]  Patient Care Team: Glori Luis, MD (Inactive) as PCP - General (Family Medicine) Iran Ouch, MD as PCP - Cardiology (Cardiology) Loree Fee, [facility] (Pharmacist)  CHIEF COMPLAINT: Bone lesion.  INTERVAL HISTORY: Patient is a 89 year old male who underwent MRI of the brain after a fall that noted bone lesions in his skull.  Upon review of imaging these have been evident for several years and were unchanged and thought to be benign.  Much of the history is given by his daughter.  He currently feels well and is at his baseline.  He denies any recent fevers or illnesses.  He has a good appetite and denies weight loss.  He denies any pain.  He has no chest pain, shortness of breath, cough, or hemoptysis.  He denies any nausea, vomiting, constipation, or diarrhea.  He has no urinary complaints.  Patient offers no specific complaints today.  REVIEW OF SYSTEMS:   Review of Systems  Constitutional: Negative.  Negative for fever, malaise/fatigue and weight loss.  Respiratory: Negative.  Negative for cough, hemoptysis and shortness of breath.   Cardiovascular: Negative.  Negative for chest pain and leg swelling.  Gastrointestinal: Negative.  Negative for abdominal pain.  Genitourinary: Negative.  Negative for dysuria.  Musculoskeletal: Negative.  Negative for back pain.  Skin: Negative.  Negative for rash.  Neurological: Negative.  Negative for dizziness, focal weakness, weakness and headaches.  Psychiatric/Behavioral: Negative.  The patient is not nervous/anxious.     As per HPI. Otherwise, a complete review of systems is negative.  PAST MEDICAL HISTORY: Past Medical History:  Diagnosis Date   Atrial flutter (HCC)    CAD (coronary artery disease)    CKD (chronic kidney disease), stage II    COPD (chronic obstructive pulmonary  disease) (HCC)    Coronary artery disease    CABG in 1991 at [facility]. Most recent cardiac catheterization in 2004 was complicated by stroke.   Diabetes mellitus without complication (HCC)    Hyperlipidemia    Hypertension    Prostate cancer (HCC)    Stroke (HCC)     PAST SURGICAL HISTORY: Past Surgical History:  Procedure Laterality Date   CARDIAC CATHETERIZATION     CARDIAC SURGERY     CARDIOVERSION  03/29/13   CORONARY ARTERY BYPASS GRAFT      FAMILY HISTORY: Family History  Problem Relation Age of Onset   Heart disease Father    Coronary artery disease Father    Cancer Other        family hx   Coronary artery disease Other        family hx    Diabetes Brother    Heart attack Brother     ADVANCED DIRECTIVES (Y/N):  N  HEALTH MAINTENANCE: Social History   Tobacco Use   Smoking status: Former    Current packs/day: 1.00    Average packs/day: 1 pack/day for 25.0 years (25.0 ttl pk-yrs)    Types: Cigarettes   Smokeless tobacco: Never  Vaping Use   Vaping status: Never Used  Substance Use Topics   Alcohol use: No    Comment: Rare   Drug use: No     Colonoscopy:  PAP:  Bone density:  Lipid panel:  No Known Allergies  Current Outpatient Medications  Medication Sig Dispense Refill   albuterol (VENTOLIN HFA) 108 (90 Base) MCG/ACT inhaler Inhale 1-2 puffs  into the lungs every 4 (four) hours as needed for wheezing or shortness of breath. 1 each 0   apixaban (ELIQUIS) 2.5 MG TABS tablet Take 1 tablet (2.5 mg total) by mouth 2 (two) times daily. 60 tablet 5   atorvastatin (LIPITOR) 40 MG tablet Take 1 tablet (40 mg total) by mouth daily. 90 tablet 3   bisacodyl (DULCOLAX) 5 MG EC tablet Take 5 mg by mouth daily as needed.     Fluticasone-Umeclidin-Vilant (TRELEGY ELLIPTA) 100-62.5-25 MCG/ACT AEPB Inhale 1 puff into the lungs daily. 60 each 3   levothyroxine (SYNTHROID) 25 MCG tablet Take 1 tablet (25 mcg total) by mouth daily before breakfast. 90 tablet 1    linagliptin (TRADJENTA) 5 MG TABS tablet Take 1 tablet (5 mg total) by mouth daily. 90 tablet 1   nitroGLYCERIN (NITROSTAT) 0.4 MG SL tablet Place 1 tablet (0.4 mg total) under the tongue every 5 (five) minutes as needed for chest pain. 25 tablet 3   No current facility-administered medications for this visit.    OBJECTIVE: Vitals:   01/28/24 1414  BP: 116/68  Pulse: 90  Resp: 14  Temp: (!) 96.8 F (36 C)  SpO2: 95%     Body mass index is 24.33 kg/m.    ECOG FS:0 - Asymptomatic  General: Well-developed, well-nourished, no acute distress.  Sitting in a wheelchair. Eyes: Pink conjunctiva, anicteric sclera. HEENT: Normocephalic, moist mucous membranes. Lungs: No audible wheezing or coughing. Heart: Regular rate and rhythm. Abdomen: Soft, nontender, no obvious distention. Musculoskeletal: No edema, cyanosis, or clubbing. Neuro: Alert, answering all questions appropriately. Cranial nerves grossly intact. Skin: No rashes or petechiae noted. Psych: Normal affect. Lymphatics: No cervical, calvicular, axillary or inguinal LAD.   LAB RESULTS:  Lab Results  Component Value Date   NA 132 (L) 01/28/2024   K 3.9 01/28/2024   CL 100 01/28/2024   CO2 25 01/28/2024   GLUCOSE 195 (H) 01/28/2024   BUN 30 (H) 01/28/2024   CREATININE 2.38 (H) 01/28/2024   CALCIUM 8.7 (L) 01/28/2024   PROT 6.4 08/08/2023   ALBUMIN 3.8 10/09/2022   AST 12 08/08/2023   ALT 5 (L) 08/08/2023   ALKPHOS 84 05/01/2022   BILITOT 0.8 08/08/2023   GFRNONAA 24 (L) 01/28/2024   GFRAA 48 (L) 07/02/2017    Lab Results  Component Value Date   WBC 4.5 01/28/2024   NEUTROABS 3.0 10/09/2022   HGB 12.9 (L) 01/28/2024   HCT 38.9 (L) 01/28/2024   MCV 96.0 01/28/2024   PLT 203 01/28/2024     STUDIES: MR Brain W Wo Contrast Result Date: 01/27/2024 EXAM: MRI HEAD WITHOUT AND WITH CONTRAST TECHNIQUE: Multiplanar, multiecho pulse sequences of the brain and surrounding structures were obtained without and with  intravenous contrast. CONTRAST:  7mL GADAVIST GADOBUTROL 1 MMOL/ML IV SOLN COMPARISON:  Correlation made with prior CTs 2025, 2023, 2021 FINDINGS: Brain: There is no acute infarction or intracranial hemorrhage. There is no intracranial mass, mass effect, or edema. There is no hydrocephalus or extra-axial fluid collection. Prominence of the ventricles and sulci reflecting moderate to marked parenchymal volume loss. Large chronic left occipital infarct. Small chronic left parietal infarct. Other patchy T2 hyperintensity in the supratentorial white matter is nonspecific but may reflect mild chronic microvascular ischemic changes. Enhancement along the right greater sphenoid wing appears to be vascular. No abnormal enhancement. Vascular: Major vessel flow voids at the skull base are preserved. Skull and upper cervical spine: T2 hyperintense, enhancing lesion of the left parietal calvarium with  thinning of the inner table better seen on prior CT. Second small right parietal lesion has a similar appearance. Third small lesion on series 16, image 90. No epidural extension. Normal marrow signal is otherwise within pres normal limits erved. Sinuses/Orbits: Minor mucosal thickening. Bilateral lens replacements. Other: Sella is unremarkable. Right mastoid tip fluid opacification. IMPRESSION: Enhancing lesion of the left parietal calvarium is indeterminate but stability since 2021 is reassuring. Two other much smaller enhancing lesions probably reflect the same benign process with one newly present in 2025 and the other newly present in 2023. As these are indeterminate, follow-up could be considered if indicated given advanced age. Electronically Signed   By: Guadlupe Spanish M.D.   On: 01/27/2024 10:18    ASSESSMENT: Bone lesions.  PLAN:    Bone lesions: MRI results from January 27, 2024 reviewed independently and reported as above with indeterminate lesions of patient's calvarium but chronic and unchanged since at least  2001.  Have ordered myeloma workup as well as PSA for completeness.  No further interventions are needed.  Patient does not require additional imaging or bone marrow biopsy.  Will do a video assisted telemedicine visit in 3 weeks to discuss the results. Renal insufficiency: Patient's creatinine is trending up and is now 2.38.  Continue follow-up and treatment per nephrology. Hyponatremia: Mild, monitor.  Patient sodium levels is 132. Anemia: Mild, monitor.  Patient hemoglobin is 12.9 today.  I spent a total of 45 minutes reviewing chart data, face-to-face evaluation with the patient, counseling and coordination of care as detailed above.   Patient expressed understanding and was in agreement with this plan. He also understands that He can call clinic at any time with any questions, concerns, or complaints.    Jeralyn Ruths, MD   01/28/2024 3:48 PM

## 2024-01-28 NOTE — Telephone Encounter (Signed)
 Spoke with pt's daughter Rosey Bath, on [place], and informed her of pt's MRI results. She stated that they saw the oncologist today and he was able to see the results and went over them with pt during the visit.

## 2024-01-28 NOTE — Progress Notes (Signed)
 Patient is here with his daughter, and she doesn't really understand why they are here today, and she is getting advance directive stuff taken care of since the patient can't make own decisions or answer any of my questions.

## 2024-01-30 LAB — KAPPA/LAMBDA LIGHT CHAINS
Kappa free light chain: 56.6 mg/L — ABNORMAL HIGH (ref 3.3–19.4)
Kappa, lambda light chain ratio: 1.42 (ref 0.26–1.65)
Lambda free light chains: 39.9 mg/L — ABNORMAL HIGH (ref 5.7–26.3)

## 2024-01-30 LAB — IGG, IGA, IGM
IgA: 417 mg/dL (ref 61–437)
IgG (Immunoglobin G), Serum: 1002 mg/dL (ref 603–1613)
IgM (Immunoglobulin M), Srm: 76 mg/dL (ref 15–143)

## 2024-02-02 LAB — PROTEIN ELECTROPHORESIS, SERUM
A/G Ratio: 1.1 (ref 0.7–1.7)
Albumin ELP: 3.1 g/dL (ref 2.9–4.4)
Alpha-1-Globulin: 0.2 g/dL (ref 0.0–0.4)
Alpha-2-Globulin: 0.7 g/dL (ref 0.4–1.0)
Beta Globulin: 0.9 g/dL (ref 0.7–1.3)
Gamma Globulin: 1 g/dL (ref 0.4–1.8)
Globulin, Total: 2.9 g/dL (ref 2.2–3.9)
Total Protein ELP: 6 g/dL (ref 6.0–8.5)

## 2024-02-06 ENCOUNTER — Encounter: Payer: Medicare HMO | Admitting: Family Medicine

## 2024-02-06 ENCOUNTER — Ambulatory Visit: Payer: Medicare HMO | Admitting: Family Medicine

## 2024-02-18 ENCOUNTER — Inpatient Hospital Stay: Attending: Oncology | Admitting: Oncology

## 2024-02-18 DIAGNOSIS — N289 Disorder of kidney and ureter, unspecified: Secondary | ICD-10-CM

## 2024-02-18 DIAGNOSIS — D649 Anemia, unspecified: Secondary | ICD-10-CM

## 2024-02-18 DIAGNOSIS — E871 Hypo-osmolality and hyponatremia: Secondary | ICD-10-CM | POA: Diagnosis not present

## 2024-02-18 DIAGNOSIS — M899 Disorder of bone, unspecified: Secondary | ICD-10-CM

## 2024-02-18 NOTE — Progress Notes (Unsigned)
 [facility]  [phone] Fax:([phone]) [phone]  ID: Devin Reese OB: [birth year]  MR#: [id number]  AOZ#:[id number]  Patient Care Team: Glori Luis, MD (Inactive) as PCP - General (Family Medicine) Iran Ouch, MD as PCP - Cardiology (Cardiology) Loree Fee, [facility] (Pharmacist) Jeralyn Ruths, MD as Consulting Physician (Oncology)  I connected with Devin Reese on 02/19/24 at  2:30 PM EDT by video enabled telemedicine visit and verified that I am speaking with the correct person using two identifiers.   I discussed the limitations, risks, security and privacy concerns of performing an evaluation and management service by telemedicine and the availability of in-person appointments. I also discussed with the patient that there may be a patient responsible charge related to this service. The patient expressed understanding and agreed to proceed.   Other persons participating in the visit and their role in the encounter: Patient, patient's daughter, MD.  Patient's location: Home. Provider's location: Clinic.  CHIEF COMPLAINT: Bone lesion.  INTERVAL HISTORY: Patient agreed to video-assisted telemedicine visit for further evaluation and discussion of his laboratory results.  Much of the history once again is given by his daughter.  He currently feels well and is asymptomatic.  He denies any recent fevers or illnesses.  He has a good appetite and denies weight loss.  He denies any pain.  He has no chest pain, shortness of breath, cough, or hemoptysis.  He denies any nausea, vomiting, constipation, or diarrhea.  He has no urinary complaints.  Patient offers no specific complaints today.  REVIEW OF SYSTEMS:   Review of Systems  Constitutional: Negative.  Negative for fever, malaise/fatigue and weight loss.  Respiratory: Negative.  Negative for cough, hemoptysis and shortness of breath.   Cardiovascular: Negative.  Negative for chest  pain and leg swelling.  Gastrointestinal: Negative.  Negative for abdominal pain.  Genitourinary: Negative.  Negative for dysuria.  Musculoskeletal: Negative.  Negative for back pain.  Skin: Negative.  Negative for rash.  Neurological: Negative.  Negative for dizziness, focal weakness, weakness and headaches.  Psychiatric/Behavioral: Negative.  The patient is not nervous/anxious.     As per HPI. Otherwise, a complete review of systems is negative.  PAST MEDICAL HISTORY: Past Medical History:  Diagnosis Date   Atrial flutter (HCC)    CAD (coronary artery disease)    CKD (chronic kidney disease), stage II    COPD (chronic obstructive pulmonary disease) (HCC)    Coronary artery disease    CABG in 1991 at [facility]. Most recent cardiac catheterization in 2004 was complicated by stroke.   Diabetes mellitus without complication (HCC)    Hyperlipidemia    Hypertension    Prostate cancer (HCC)    Stroke (HCC)     PAST SURGICAL HISTORY: Past Surgical History:  Procedure Laterality Date   CARDIAC CATHETERIZATION     CARDIAC SURGERY     CARDIOVERSION  03/29/13   CORONARY ARTERY BYPASS GRAFT      FAMILY HISTORY: Family History  Problem Relation Age of Onset   Heart disease Father    Coronary artery disease Father    Cancer Other        family hx   Coronary artery disease Other        family hx    Diabetes Brother    Heart attack Brother     ADVANCED DIRECTIVES (Y/N):  N  HEALTH MAINTENANCE: Social History   Tobacco Use   Smoking status: Former    Current packs/day:  1.00    Average packs/day: 1 pack/day for 25.0 years (25.0 ttl pk-yrs)    Types: Cigarettes   Smokeless tobacco: Never  Vaping Use   Vaping status: Never Used  Substance Use Topics   Alcohol use: No    Comment: Rare   Drug use: No     Colonoscopy:  PAP:  Bone density:  Lipid panel:  No Known Allergies  Current Outpatient Medications  Medication Sig Dispense Refill   albuterol (VENTOLIN HFA) 108  (90 Base) MCG/ACT inhaler Inhale 1-2 puffs into the lungs every 4 (four) hours as needed for wheezing or shortness of breath. 1 each 0   apixaban (ELIQUIS) 2.5 MG TABS tablet Take 1 tablet (2.5 mg total) by mouth 2 (two) times daily. 60 tablet 5   atorvastatin (LIPITOR) 40 MG tablet Take 1 tablet (40 mg total) by mouth daily. 90 tablet 3   bisacodyl (DULCOLAX) 5 MG EC tablet Take 5 mg by mouth daily as needed.     Fluticasone-Umeclidin-Vilant (TRELEGY ELLIPTA) 100-62.5-25 MCG/ACT AEPB Inhale 1 puff into the lungs daily. 60 each 3   levothyroxine (SYNTHROID) 25 MCG tablet Take 1 tablet (25 mcg total) by mouth daily before breakfast. 90 tablet 1   linagliptin (TRADJENTA) 5 MG TABS tablet Take 1 tablet (5 mg total) by mouth daily. 90 tablet 1   nitroGLYCERIN (NITROSTAT) 0.4 MG SL tablet Place 1 tablet (0.4 mg total) under the tongue every 5 (five) minutes as needed for chest pain. 25 tablet 3   No current facility-administered medications for this visit.    OBJECTIVE: There were no vitals filed for this visit.    There is no height or weight on file to calculate BMI.    ECOG FS:0 - Asymptomatic  General: Well-developed, well-nourished, no acute distress. HEENT: Normocephalic. Neuro: Alert, answering all questions appropriately. Cranial nerves grossly intact. Psych: Normal affect.  LAB RESULTS:  Lab Results  Component Value Date   NA 132 (L) 01/28/2024   K 3.9 01/28/2024   CL 100 01/28/2024   CO2 25 01/28/2024   GLUCOSE 195 (H) 01/28/2024   BUN 30 (H) 01/28/2024   CREATININE 2.38 (H) 01/28/2024   CALCIUM 8.7 (L) 01/28/2024   PROT 6.4 08/08/2023   ALBUMIN 3.8 10/09/2022   AST 12 08/08/2023   ALT 5 (L) 08/08/2023   ALKPHOS 84 05/01/2022   BILITOT 0.8 08/08/2023   GFRNONAA 24 (L) 01/28/2024   GFRAA 48 (L) 07/02/2017    Lab Results  Component Value Date   WBC 4.5 01/28/2024   NEUTROABS 3.0 10/09/2022   HGB 12.9 (L) 01/28/2024   HCT 38.9 (L) 01/28/2024   MCV 96.0 01/28/2024    PLT 203 01/28/2024     STUDIES: No results found.   ASSESSMENT: Bone lesions.  PLAN:    Bone lesions: MRI results from January 27, 2024 reviewed independently with indeterminate lesions of patient's calvarium but chronic and unchanged since at least 2001.  PSA, SPEP, immunoglobulins are all negative.  Patient has elevated kappa and lambda free light chains, but his light chain ratio is within normal limits therefore clinically insignificant.  No interval is needed.  No further imaging is necessary.  Patient does not require bone marrow biopsy.  No follow-up has been scheduled. Renal insufficiency: Patient's creatinine is trending up and is now 2.38.  Continue follow-up and treatment per nephrology. Hyponatremia: Mild, monitor.  Patient sodium levels is 132. Anemia: Mild.  Patient's most recent hemoglobin is 12.9.  I provided 20 minutes of  face-to-face video visit time during this encounter which included chart review, counseling, and coordination of care as documented above.    Patient expressed understanding and was in agreement with this plan. He also understands that He can call clinic at any time with any questions, concerns, or complaints.    Jeralyn Ruths, MD   02/18/2024 2:28 PM

## 2024-02-21 ENCOUNTER — Emergency Department
Admission: EM | Admit: 2024-02-21 | Discharge: 2024-02-22 | Disposition: A | Discharge status: Home / Self Care | Attending: Emergency Medicine | Admitting: Emergency Medicine

## 2024-02-21 ENCOUNTER — Other Ambulatory Visit: Payer: Self-pay

## 2024-02-21 DIAGNOSIS — W01198A Fall on same level from slipping, tripping and stumbling with subsequent striking against other object, initial encounter: Secondary | ICD-10-CM | POA: Insufficient documentation

## 2024-02-21 DIAGNOSIS — S0001XA Abrasion of scalp, initial encounter: Secondary | ICD-10-CM | POA: Diagnosis not present

## 2024-02-21 DIAGNOSIS — N189 Chronic kidney disease, unspecified: Secondary | ICD-10-CM | POA: Diagnosis not present

## 2024-02-21 DIAGNOSIS — M50221 Other cervical disc displacement at C4-C5 level: Secondary | ICD-10-CM | POA: Diagnosis not present

## 2024-02-21 DIAGNOSIS — S0990XA Unspecified injury of head, initial encounter: Secondary | ICD-10-CM

## 2024-02-21 DIAGNOSIS — W19XXXA Unspecified fall, initial encounter: Secondary | ICD-10-CM

## 2024-02-21 DIAGNOSIS — F0390 Unspecified dementia without behavioral disturbance: Secondary | ICD-10-CM | POA: Diagnosis not present

## 2024-02-21 DIAGNOSIS — M4802 Spinal stenosis, cervical region: Secondary | ICD-10-CM | POA: Diagnosis not present

## 2024-02-21 DIAGNOSIS — S199XXA Unspecified injury of neck, initial encounter: Secondary | ICD-10-CM | POA: Diagnosis not present

## 2024-02-21 DIAGNOSIS — M47812 Spondylosis without myelopathy or radiculopathy, cervical region: Secondary | ICD-10-CM | POA: Diagnosis not present

## 2024-02-21 DIAGNOSIS — I129 Hypertensive chronic kidney disease with stage 1 through stage 4 chronic kidney disease, or unspecified chronic kidney disease: Secondary | ICD-10-CM | POA: Diagnosis not present

## 2024-02-21 NOTE — ED Triage Notes (Signed)
 Pt to ed from home via POV for fall and struck his head. Unknown LOC. Pt is on plavix. Pt has hematoma to top of head. Pt is alert and answering questions as asked.

## 2024-02-22 ENCOUNTER — Emergency Department

## 2024-02-22 ENCOUNTER — Other Ambulatory Visit

## 2024-02-22 DIAGNOSIS — S0990XA Unspecified injury of head, initial encounter: Secondary | ICD-10-CM | POA: Diagnosis not present

## 2024-02-22 DIAGNOSIS — S199XXA Unspecified injury of neck, initial encounter: Secondary | ICD-10-CM | POA: Diagnosis not present

## 2024-02-22 DIAGNOSIS — M4802 Spinal stenosis, cervical region: Secondary | ICD-10-CM | POA: Diagnosis not present

## 2024-02-22 DIAGNOSIS — M47812 Spondylosis without myelopathy or radiculopathy, cervical region: Secondary | ICD-10-CM | POA: Diagnosis not present

## 2024-02-22 DIAGNOSIS — M50221 Other cervical disc displacement at C4-C5 level: Secondary | ICD-10-CM | POA: Diagnosis not present

## 2024-02-22 LAB — CBC WITH DIFFERENTIAL/PLATELET
Abs Immature Granulocytes: 0.01 10*3/uL (ref 0.00–0.07)
Basophils Absolute: 0 10*3/uL (ref 0.0–0.1)
Basophils Relative: 1 %
Eosinophils Absolute: 0.1 10*3/uL (ref 0.0–0.5)
Eosinophils Relative: 2 %
HCT: 37.2 % — ABNORMAL LOW (ref 39.0–52.0)
Hemoglobin: 12.4 g/dL — ABNORMAL LOW (ref 13.0–17.0)
Immature Granulocytes: 0 %
Lymphocytes Relative: 33 %
Lymphs Abs: 1.4 10*3/uL (ref 0.7–4.0)
MCH: 32.4 pg (ref 26.0–34.0)
MCHC: 33.3 g/dL (ref 30.0–36.0)
MCV: 97.1 fL (ref 80.0–100.0)
Monocytes Absolute: 0.5 10*3/uL (ref 0.1–1.0)
Monocytes Relative: 11 %
Neutro Abs: 2.3 10*3/uL (ref 1.7–7.7)
Neutrophils Relative %: 53 %
Platelets: 174 10*3/uL (ref 150–400)
RBC: 3.83 MIL/uL — ABNORMAL LOW (ref 4.22–5.81)
RDW: 13.8 % (ref 11.5–15.5)
WBC: 4.4 10*3/uL (ref 4.0–10.5)
nRBC: 0 % (ref 0.0–0.2)

## 2024-02-22 LAB — BASIC METABOLIC PANEL WITH GFR
Anion gap: 7 (ref 5–15)
BUN: 31 mg/dL — ABNORMAL HIGH (ref 8–23)
CO2: 24 mmol/L (ref 22–32)
Calcium: 8.8 mg/dL — ABNORMAL LOW (ref 8.9–10.3)
Chloride: 106 mmol/L (ref 98–111)
Creatinine, Ser: 2.17 mg/dL — ABNORMAL HIGH (ref 0.61–1.24)
GFR, Estimated: 27 mL/min — ABNORMAL LOW (ref >60)
Glucose, Bld: 130 mg/dL — ABNORMAL HIGH (ref 70–99)
Potassium: 4.1 mmol/L (ref 3.5–5.1)
Sodium: 137 mmol/L (ref 135–145)

## 2024-02-22 NOTE — ED Notes (Addendum)
 Daughter and grandson at bedside. Fall bundle implemented.

## 2024-02-22 NOTE — ED Provider Notes (Signed)
 [facility] Provider Note    Event Date/Time   First MD Initiated Contact with Patient 02/21/24 2358     (approximate)   History   Fall  Level 5 caveat:  history/ROS limited by at least mild dementia  HPI Devin Reese is a 89 y.o. male who presents after a fall.  Circumstances are unclear but apparently he was getting into bed and had a mechanical fall, striking the front part of his scalp on something, most likely the bedpost.  He uses a walker at baseline and thus was unable to get up on his own.  He is here now with his daughter and his 65 year old grandson.  They state that he is at his baseline.  He is extremely hard of hearing so communication is difficult.  He is a little bit confused but they said that is normal.  He is not reporting any pain.  He says that he feels bad about what he did (meaning the fall ) and wants to go home.     Physical Exam   Triage Vital Signs: ED Triage Vitals  Encounter Vitals Group     BP 02/22/24 0022 98/69     Systolic BP Percentile --      Diastolic BP Percentile --      Pulse Rate 02/22/24 0022 70     Resp 02/22/24 0022 14     Temp 02/22/24 0022 97.8 F (36.6 C)     Temp src --      SpO2 02/22/24 0022 100 %     Weight 02/22/24 0022 70.3 kg (155 lb)     Height 02/21/24 2352 1.727 m (5\' 8" )     Head Circumference --      Peak Flow --      Pain Score 02/21/24 2352 0     Pain Loc --      Pain Education --      Exclude from Growth Chart --     Most recent vital signs: Vitals:   02/22/24 0022 02/22/24 0100  BP: 98/69 106/62  Pulse: 70 72  Resp: 14 15  Temp: 97.8 F (36.6 C)   SpO2: 100% 100%    General: Awake, alert, extremely hard of hearing but alert and responsive. CV:  Good peripheral perfusion.  Regular rate and rhythm. Resp:  Normal effort. Speaking easily and comfortably, no accessory muscle usage nor intercostal retractions.   Abd:  No distention.  No tenderness to palpation of the  abdomen. Other:  No obvious injuries to arms or legs.  No tenderness to palpation of cervical spine.  Patient has a superficial abrasion to the middle frontal part of his scalp above the forehead that does not require intervention.  No pain with passive range of motion to arms or legs.  No obvious other contusions or injuries.   ED Results / Procedures / Treatments   Labs (all labs ordered are listed, but only abnormal results are displayed) Labs Reviewed  CBC WITH DIFFERENTIAL/PLATELET - Abnormal; Notable for the following components:      Result Value   RBC 3.83 (*)    Hemoglobin 12.4 (*)    HCT 37.2 (*)    All other components within normal limits  BASIC METABOLIC PANEL WITH GFR - Abnormal; Notable for the following components:   Glucose, Bld 130 (*)    BUN 31 (*)    Creatinine, Ser 2.17 (*)    Calcium 8.8 (*)    GFR, Estimated  27 (*)    All other components within normal limits     EKG  ED ECG REPORT I, Loleta Rose, the attending physician, personally viewed and interpreted this ECG.  Date: 02/22/2024 EKG Time: 00: 09 Rate: 72 Rhythm: normal sinus rhythm QRS Axis: normal Intervals: normal ST/T Wave abnormalities: Non-specific ST segment / T-wave changes, but no clear evidence of acute ischemia. Narrative Interpretation: no definitive evidence of acute ischemia; does not meet STEMI criteria.    RADIOLOGY I viewed and interpreted the patient's head CT and cervical spine CT, and I see no evidence of acute traumatic injury such as intracranial bleed, skull fracture, or cervical spine injury.  Radiology report confirms no acute abnormalities.   PROCEDURES:  Critical Care performed: No  Procedures    IMPRESSION / MDM / ASSESSMENT AND PLAN / ED COURSE  I reviewed the triage vital signs and the nursing notes.                              Differential diagnosis includes, but is not limited to, fracture, dislocation/listhesis, intracranial bleed, infection,  electrolyte or metabolic abnormality.  Patient's presentation is most consistent with acute presentation with potential threat to life or bodily function.  Labs/studies ordered: EKG, CT head, CT cervical spine, CBC with differential, basic metabolic panel  Interventions/Medications given:  Medications - No data to display  (Note:  hospital course my include additional interventions and/or labs/studies not listed above.)   Stable chronic kidney disease, otherwise normal labs.  Normal imaging as described above.  Vital signs stable.  I had my usual discussion with the patient and his family.  They are comfortable taking him home.  The patient's medical screening exam is reassuring with no indication of an emergent medical condition requiring hospitalization or additional evaluation at this point.  The patient is safe and appropriate for discharge and outpatient follow up.  I gave my usual return precautions.         FINAL CLINICAL IMPRESSION(S) / ED DIAGNOSES   Final diagnoses:  Fall, initial encounter  Abrasion of scalp, initial encounter  Minor head injury, initial encounter  Chronic kidney disease, unspecified CKD stage     Rx / DC Orders   ED Discharge Orders     None        Note:  This document was prepared using Dragon voice recognition software and may include unintentional dictation errors.   Loleta Rose, MD 02/22/24 0130

## 2024-02-22 NOTE — ED Notes (Signed)
 Pt to CT

## 2024-02-22 NOTE — Discharge Instructions (Signed)
 You have been seen in the Emergency Department (ED) today for a fall.  Your work up does not show any concerning injuries.  Please take over-the-counter ibuprofen and/or Tylenol as needed for your pain (unless you have an allergy or your doctor as told you not to take them), or take any prescribed medication as instructed.  Keep the scalp wound clean and dry and apply a thin layer of antibiotic ointment to it.  It should heal well on its own.  Please follow up with your doctor regarding today's Emergency Department (ED) visit and your recent fall.    Return to the ED if you have any headache, confusion, slurred speech, weakness/numbness of any arm or leg, or any increased pain.

## 2024-03-01 ENCOUNTER — Telehealth: Payer: Self-pay | Admitting: Family Medicine

## 2024-03-01 ENCOUNTER — Ambulatory Visit: Admitting: Nurse Practitioner

## 2024-03-01 NOTE — Telephone Encounter (Signed)
 Your provider will not be in the office today. Please call the office to reschedule.

## 2024-03-09 ENCOUNTER — Ambulatory Visit: Attending: Cardiovascular Disease | Admitting: Cardiovascular Disease

## 2024-03-09 ENCOUNTER — Encounter: Payer: Self-pay | Admitting: Cardiovascular Disease

## 2024-03-09 VITALS — BP 85/56 | HR 103 | Ht 67.0 in | Wt 160.0 lb

## 2024-03-09 DIAGNOSIS — E785 Hyperlipidemia, unspecified: Secondary | ICD-10-CM | POA: Diagnosis not present

## 2024-03-09 DIAGNOSIS — I4892 Unspecified atrial flutter: Secondary | ICD-10-CM | POA: Diagnosis not present

## 2024-03-09 DIAGNOSIS — I10 Essential (primary) hypertension: Secondary | ICD-10-CM

## 2024-03-09 DIAGNOSIS — I2510 Atherosclerotic heart disease of native coronary artery without angina pectoris: Secondary | ICD-10-CM

## 2024-03-09 NOTE — Patient Instructions (Signed)
 Medication Instructions:  STOP the Eliquis  *If you need a refill on your cardiac medications before your next appointment, please call your pharmacy*  Lab Work: None ordered If you have labs (blood work) drawn today and your tests are completely normal, you will receive your results only by: MyChart Message (if you have MyChart) OR A paper copy in the mail If you have any lab test that is abnormal or we need to change your treatment, we will call you to review the results.  Testing/Procedures: None ordered  Follow-Up: At [facility], you and your health needs are our priority.  As part of our continuing mission to provide you with exceptional heart care, our providers are all part of one team.  This team includes your primary Cardiologist (physician) and Advanced Practice Providers or APPs (Physician Assistants and Nurse Practitioners) who all work together to provide you with the care you need, when you need it.  Your next appointment:   6 month(s)  Provider:   You may see Antionette Kirks, MD or one of the following Advanced Practice Providers on your designated Care Team:   Laneta Pintos, NP Gildardo Labrador, PA-C Varney Gentleman, PA-C Cadence Verlot, PA-C Ronald Cockayne, NP Morey Ar, NP    We recommend signing up for the patient portal called "MyChart".  Sign up information is provided on this After Visit Summary.  MyChart is used to connect with patients for Virtual Visits (Telemedicine).  Patients are able to view lab/test results, encounter notes, upcoming appointments, etc.  Non-urgent messages can be sent to your provider as well.   To learn more about what you can do with MyChart, go to [email or web address].

## 2024-03-09 NOTE — Progress Notes (Signed)
 Cardiology Office Note   Date:  03/10/2024   ID:  Devin Reese, DOB 01/19/1927, MRN [id number]  PCP:  Pcp, No  Cardiologist:   Antionette Kirks, MD   No chief complaint on file.     History of Present Illness: Devin Reese is a 89 y.o. male who presents for a followup visit regarding coronary artery disease and atrial flutter. He has known history of coronary artery disease status post CABG in 1991 at [facility]. He was diagnosed with atrial flutter in 2014 with subsequent successful cardioversion.  Most recent echocardiogram in 2018 showed mildly reduced LV systolic function with an EF of 40 to 45% with inferior wall hypokinesis, mild mitral and aortic regurgitation. Carvedilol  was discontinued due to bradycardia.  Lexiscan  Myoview  in August 2018 showed prior inferior scar with no significant ischemia.  He has chronic kidney disease followed by Dr. Zelda Hickman.  He declined significantly over the last year with poor appetite and weight loss.  In addition, he had multiple falls in the last 6 months most recently required emergency room visit few weeks ago with trauma to his head but fortunately no evidence of bleeding by imaging.   Past Medical History:  Diagnosis Date   Atrial flutter (HCC)    CAD (coronary artery disease)    CKD (chronic kidney disease), stage II    COPD (chronic obstructive pulmonary disease) (HCC)    Coronary artery disease    CABG in 1991 at [facility]. Most recent cardiac catheterization in 2004 was complicated by stroke.   Diabetes mellitus without complication (HCC)    Hyperlipidemia    Hypertension    Prostate cancer (HCC)    Stroke [facility])     Past Surgical History:  Procedure Laterality Date   CARDIAC CATHETERIZATION     CARDIAC SURGERY     CARDIOVERSION  03/29/13   CORONARY ARTERY BYPASS GRAFT       Current Outpatient Medications  Medication Sig Dispense Refill   acetaminophen  (TYLENOL ) 500 MG tablet Take 500 mg by mouth.     albuterol   (VENTOLIN  HFA) 108 (90 Base) MCG/ACT inhaler Inhale 1-2 puffs into the lungs every 4 (four) hours as needed for wheezing or shortness of breath. 1 each 0   bisacodyl (DULCOLAX) 5 MG EC tablet Take 5 mg by mouth daily as needed.     Fluticasone-Umeclidin-Vilant (TRELEGY ELLIPTA ) 100-62.5-25 MCG/ACT AEPB Inhale 1 puff into the lungs daily. 60 each 3   furosemide (LASIX) 20 MG tablet Take by mouth.     levothyroxine  (SYNTHROID ) 25 MCG tablet Take 1 tablet (25 mcg total) by mouth daily before breakfast. 90 tablet 1   linagliptin  (TRADJENTA ) 5 MG TABS tablet Take 1 tablet (5 mg total) by mouth daily. 90 tablet 1   nitroGLYCERIN  (NITROSTAT ) 0.4 MG SL tablet Place 1 tablet (0.4 mg total) under the tongue every 5 (five) minutes as needed for chest pain. 25 tablet 3   atorvastatin  (LIPITOR) 40 MG tablet Take 1 tablet (40 mg total) by mouth daily. 90 tablet 3   No current facility-administered medications for this visit.    Allergies:   Patient has no known allergies.    Social History:  The patient  reports that he has quit smoking. His smoking use included cigarettes. He has a 25 pack-year smoking history. He has never used smokeless tobacco. He reports that he does not drink alcohol and does not use drugs.   Family History:  The patient's family history includes Cancer in an  other family member; Coronary artery disease in his father and another family member; Diabetes in his brother; Heart attack in his brother; Heart disease in his father.    ROS:  Please see the history of present illness.   Otherwise, review of systems are positive for none.   All other systems are reviewed and negative.    PHYSICAL EXAM: VS:  BP (!) 85/56   Pulse (!) 103   Ht 5\' 7"  (1.702 m)   Wt 160 lb (72.6 kg)   SpO2 97%   BMI 25.06 kg/m  , BMI Body mass index is 25.06 kg/m. GEN: Well nourished, well developed, in no acute distress  HEENT: normal  Neck: no JVD, carotid bruits, or masses Cardiac: RRR with premature  beats; no rubs, or gallops,no edema .  1 /6 systolic murmur in the aortic area. Respiratory:  clear to auscultation bilaterally, normal work of breathing GI: soft, nontender, nondistended, + BS MS: no deformity or atrophy  Skin: warm and dry, no rash Neuro:  Strength and sensation are intact Psych: euthymic mood, full affect   EKG:  EKG is not ordered today. Recent EKG done in the ED was reviewed and showed sinus rhythm with PACs    Recent Labs: 08/08/2023: ALT 5; TSH 1.86 02/22/2024: BUN 31; Creatinine, Ser 2.17; Hemoglobin 12.4; Platelets 174; Potassium 4.1; Sodium 137    Lipid Panel    Component Value Date/Time   CHOL 161 08/08/2023 1412   CHOL 143 03/20/2017 0826   TRIG 77 08/08/2023 1412   HDL 53 08/08/2023 1412   HDL 40 03/20/2017 0826   CHOLHDL 3.0 08/08/2023 1412   VLDL 16.0 05/01/2022 1203   LDLCALC 91 08/08/2023 1412      Wt Readings from Last 3 Encounters:  03/09/24 160 lb (72.6 kg)  02/22/24 155 lb (70.3 kg)  01/28/24 160 lb (72.6 kg)         ASSESSMENT AND PLAN:  1.  Paroxysmal atrial flutter: Recent EKG showed sinus rhythm.  Given recurrent falls and significant deterioration in the last year, I think the risk of bleeding outweighs the benefit of anticoagulation.  I elected to discontinue Eliquis  today.   2. Coronary artery disease involving native coronary arteries without angina:  He reports no anginal symptoms at the present time.   He is not on a beta-blocker due to chronic bradycardia.   3. Essential hypertension: Blood pressure is on the low side but he denies symptoms.   4. Hyperlipidemia: Continue atorvastatin  40 mg daily.  5.  Chronic kidney disease: Followed by Dr. Zelda Hickman.    Disposition:   FU with me in 6 months  Signed,  Antionette Kirks, MD  03/10/2024 2:54 PM    [facility]

## 2024-03-12 ENCOUNTER — Ambulatory Visit: Admitting: Nurse Practitioner

## 2024-03-12 ENCOUNTER — Emergency Department

## 2024-03-12 ENCOUNTER — Emergency Department
Admission: EM | Admit: 2024-03-12 | Discharge: 2024-03-12 | Disposition: A | Discharge status: Home / Self Care | Attending: Emergency Medicine | Admitting: Emergency Medicine

## 2024-03-12 ENCOUNTER — Other Ambulatory Visit: Payer: Self-pay

## 2024-03-12 DIAGNOSIS — Z8546 Personal history of malignant neoplasm of prostate: Secondary | ICD-10-CM | POA: Insufficient documentation

## 2024-03-12 DIAGNOSIS — J929 Pleural plaque without asbestos: Secondary | ICD-10-CM | POA: Diagnosis not present

## 2024-03-12 DIAGNOSIS — I129 Hypertensive chronic kidney disease with stage 1 through stage 4 chronic kidney disease, or unspecified chronic kidney disease: Secondary | ICD-10-CM | POA: Diagnosis not present

## 2024-03-12 DIAGNOSIS — E860 Dehydration: Secondary | ICD-10-CM | POA: Insufficient documentation

## 2024-03-12 DIAGNOSIS — K5730 Diverticulosis of large intestine without perforation or abscess without bleeding: Secondary | ICD-10-CM | POA: Diagnosis not present

## 2024-03-12 DIAGNOSIS — E1122 Type 2 diabetes mellitus with diabetic chronic kidney disease: Secondary | ICD-10-CM | POA: Insufficient documentation

## 2024-03-12 DIAGNOSIS — I959 Hypotension, unspecified: Secondary | ICD-10-CM | POA: Diagnosis not present

## 2024-03-12 DIAGNOSIS — I700 Atherosclerosis of aorta: Secondary | ICD-10-CM | POA: Diagnosis not present

## 2024-03-12 DIAGNOSIS — J449 Chronic obstructive pulmonary disease, unspecified: Secondary | ICD-10-CM | POA: Diagnosis not present

## 2024-03-12 DIAGNOSIS — N182 Chronic kidney disease, stage 2 (mild): Secondary | ICD-10-CM | POA: Insufficient documentation

## 2024-03-12 DIAGNOSIS — I2510 Atherosclerotic heart disease of native coronary artery without angina pectoris: Secondary | ICD-10-CM | POA: Insufficient documentation

## 2024-03-12 DIAGNOSIS — Z951 Presence of aortocoronary bypass graft: Secondary | ICD-10-CM | POA: Diagnosis not present

## 2024-03-12 DIAGNOSIS — Z8659 Personal history of other mental and behavioral disorders: Secondary | ICD-10-CM

## 2024-03-12 DIAGNOSIS — S32511K Fracture of superior rim of right pubis, subsequent encounter for fracture with nonunion: Secondary | ICD-10-CM | POA: Diagnosis not present

## 2024-03-12 DIAGNOSIS — I77810 Thoracic aortic ectasia: Secondary | ICD-10-CM | POA: Diagnosis not present

## 2024-03-12 DIAGNOSIS — R531 Weakness: Secondary | ICD-10-CM | POA: Diagnosis not present

## 2024-03-12 DIAGNOSIS — I10 Essential (primary) hypertension: Secondary | ICD-10-CM | POA: Diagnosis not present

## 2024-03-12 DIAGNOSIS — G9389 Other specified disorders of brain: Secondary | ICD-10-CM | POA: Diagnosis not present

## 2024-03-12 DIAGNOSIS — F0390 Unspecified dementia without behavioral disturbance: Secondary | ICD-10-CM | POA: Diagnosis not present

## 2024-03-12 DIAGNOSIS — K449 Diaphragmatic hernia without obstruction or gangrene: Secondary | ICD-10-CM | POA: Diagnosis not present

## 2024-03-12 LAB — COMPREHENSIVE METABOLIC PANEL WITH GFR
ALT: 9 U/L (ref 0–44)
AST: 18 U/L (ref 15–41)
Albumin: 2.4 g/dL — ABNORMAL LOW (ref 3.5–5.0)
Alkaline Phosphatase: 53 U/L (ref 38–126)
Anion gap: 4 — ABNORMAL LOW (ref 5–15)
BUN: 30 mg/dL — ABNORMAL HIGH (ref 8–23)
CO2: 22 mmol/L (ref 22–32)
Calcium: 8 mg/dL — ABNORMAL LOW (ref 8.9–10.3)
Chloride: 108 mmol/L (ref 98–111)
Creatinine, Ser: 2.21 mg/dL — ABNORMAL HIGH (ref 0.61–1.24)
GFR, Estimated: 27 mL/min — ABNORMAL LOW (ref >60)
Glucose, Bld: 118 mg/dL — ABNORMAL HIGH (ref 70–99)
Potassium: 3.3 mmol/L — ABNORMAL LOW (ref 3.5–5.1)
Sodium: 134 mmol/L — ABNORMAL LOW (ref 135–145)
Total Bilirubin: 1.3 mg/dL — ABNORMAL HIGH (ref 0.0–1.2)
Total Protein: 4.9 g/dL — ABNORMAL LOW (ref 6.5–8.1)

## 2024-03-12 LAB — TROPONIN I (HIGH SENSITIVITY)
Troponin I (High Sensitivity): 69 ng/L — ABNORMAL HIGH (ref <18)
Troponin I (High Sensitivity): 71 ng/L — ABNORMAL HIGH (ref <18)

## 2024-03-12 LAB — CBC WITH DIFFERENTIAL/PLATELET
Abs Immature Granulocytes: 0.01 10*3/uL (ref 0.00–0.07)
Basophils Absolute: 0 10*3/uL (ref 0.0–0.1)
Basophils Relative: 1 %
Eosinophils Absolute: 0.1 10*3/uL (ref 0.0–0.5)
Eosinophils Relative: 2 %
HCT: 33.9 % — ABNORMAL LOW (ref 39.0–52.0)
Hemoglobin: 11.7 g/dL — ABNORMAL LOW (ref 13.0–17.0)
Immature Granulocytes: 0 %
Lymphocytes Relative: 38 %
Lymphs Abs: 1.4 10*3/uL (ref 0.7–4.0)
MCH: 32.8 pg (ref 26.0–34.0)
MCHC: 34.5 g/dL (ref 30.0–36.0)
MCV: 95 fL (ref 80.0–100.0)
Monocytes Absolute: 0.4 10*3/uL (ref 0.1–1.0)
Monocytes Relative: 10 %
Neutro Abs: 1.8 10*3/uL (ref 1.7–7.7)
Neutrophils Relative %: 49 %
Platelets: 147 10*3/uL — ABNORMAL LOW (ref 150–400)
RBC: 3.57 MIL/uL — ABNORMAL LOW (ref 4.22–5.81)
RDW: 14 % (ref 11.5–15.5)
WBC: 3.8 10*3/uL — ABNORMAL LOW (ref 4.0–10.5)
nRBC: 0 % (ref 0.0–0.2)

## 2024-03-12 LAB — URINALYSIS, W/ REFLEX TO CULTURE (INFECTION SUSPECTED)
Bacteria, UA: NONE SEEN
Bilirubin Urine: NEGATIVE
Glucose, UA: NEGATIVE mg/dL
Hgb urine dipstick: NEGATIVE
Ketones, ur: NEGATIVE mg/dL
Leukocytes,Ua: NEGATIVE
Nitrite: NEGATIVE
Protein, ur: NEGATIVE mg/dL
Specific Gravity, Urine: 1.026 (ref 1.005–1.030)
pH: 5 (ref 5.0–8.0)

## 2024-03-12 LAB — CK: Total CK: 125 U/L (ref 49–397)

## 2024-03-12 LAB — LIPASE, BLOOD: Lipase: 27 U/L (ref 11–51)

## 2024-03-12 MED ORDER — HALOPERIDOL LACTATE 5 MG/ML IJ SOLN
2.0000 mg | Freq: Once | INTRAMUSCULAR | Status: AC
  Administered 2024-03-12: 2 mg via INTRAVENOUS
  Filled 2024-03-12: qty 1

## 2024-03-12 MED ORDER — LACTATED RINGERS IV BOLUS
1000.0000 mL | Freq: Once | INTRAVENOUS | Status: AC
  Administered 2024-03-12: 1000 mL via INTRAVENOUS

## 2024-03-12 MED ORDER — SODIUM CHLORIDE 0.9 % IV BOLUS
1000.0000 mL | Freq: Once | INTRAVENOUS | Status: AC
  Administered 2024-03-12: 1000 mL via INTRAVENOUS

## 2024-03-12 MED ORDER — QUETIAPINE FUMARATE 25 MG PO TABS: 25.0000 mg | ORAL_TABLET | Freq: Every day | ORAL | 0 refills | Status: DC

## 2024-03-12 NOTE — ED Triage Notes (Signed)
 Pt to ED via [facility] from home for c/o hypotension and increased weakness. Initial BP 80/40. 500 mL NS given increasing BP to 104/50.

## 2024-03-12 NOTE — ED Notes (Signed)
 Pt refusing to wear monitoring cords at this time. Pt agitated. This RN boosted pt in bed and pt grabbed RN's face. Alejo Amsler, MD, made aware.

## 2024-03-12 NOTE — ED Provider Notes (Signed)
 Emergency department handoff note  Care of this patient was signed out to me at the end of the previous provider shift.  All pertinent patient information was conveyed and all questions were answered.  Patient pending CT of the chest abdomen pelvis which did not show any evidence of acute abnormalities.  Patient was also pending a urinalysis that did not show any evidence of acute infection.  I discussed with patient's family at length possibility of worsening dementia as patient had significant sundowning during his emergency department stay.  Patient will be given outpatient Seroquel if sundowning occurs at home.  Family agrees with plan for discharge home The patient has been reexamined and is ready to be discharged.  All diagnostic results have been reviewed and discussed with the patient/family.  Care plan has been outlined and the patient/family understands all current diagnoses, results, and treatment plans.  There are no new complaints, changes, or physical findings at this time.  All questions have been addressed and answered.  Patient was instructed to, and agrees to follow-up with their primary care physician as well as return to the emergency department if any new or worsening symptoms develop.   Shamera Yarberry K, MD 03/12/24 [phone]

## 2024-03-12 NOTE — ED Notes (Addendum)
 Patient refusing vitals check, gets physically and verbally combative when I attempt to obtain them

## 2024-03-12 NOTE — ED Notes (Signed)
 Pt daughter came to the nurses station stating that her father was getting increasingly agitated. This RN went into check on pt. Pt has taken off monitoring equipment, pt pulling on his pants. Pt denies being in pain, denies wanting to eat or drink and denies being cold. Pt appears restless. Message sent to primary RN and EDP Bradler, requested medication to help calm pt down.

## 2024-03-12 NOTE — ED Provider Notes (Signed)
 [facility] Provider Note    Event Date/Time   First MD Initiated Contact with Patient 03/12/24 1134     (approximate)   History   Chief Complaint: Hypotension and Weakness   HPI  Devin Reese is a 89 y.o. male with a history of atrial flutter, CKD, COPD, diabetes, hypertension, CABG, prior stroke who was brought to the ED due to generalized weakness, gradually worsening over the past week.  Family at bedside notes that patient takes very little food and fluids by mouth despite their frequent encouragement.  He does have a degree of dementia.  Patient reports that he has not been eating well because he is not hungry.  Denies any pain.  Patient was in the clinic for follow-up 3 days ago, blood pressure was borderline at that time, and I tried to have the patient drink extra fluids without improvement.  Family also reports patient has had a few falls over the last week due to his low energy        Past Medical History:  Diagnosis Date   Atrial flutter (HCC)    CAD (coronary artery disease)    CKD (chronic kidney disease), stage II    COPD (chronic obstructive pulmonary disease) (HCC)    Coronary artery disease    CABG in 1991 at [facility]. Most recent cardiac catheterization in 2004 was complicated by stroke.   Diabetes mellitus without complication (HCC)    Hyperlipidemia    Hypertension    Prostate cancer [facility])    Stroke [facility])     Current Outpatient Rx   Order #: [id number] Class: Historical Med   Order #: [id number] Class: Normal   Order #: [id number] Class: Normal   Order #: [id number] Class: Historical Med   Order #: [id number] Class: Normal   Order #: [id number] Class: Historical Med   Order #: [id number] Class: Normal   Order #: [id number] Class: Normal   Order #: [id number] Class: Normal    Past Surgical History:  Procedure Laterality Date   CARDIAC CATHETERIZATION     CARDIAC SURGERY     CARDIOVERSION  03/29/13   CORONARY ARTERY BYPASS GRAFT       Physical Exam   Triage Vital Signs: ED Triage Vitals  Encounter Vitals Group     BP 03/12/24 1135 (!) 92/45     Systolic BP Percentile --      Diastolic BP Percentile --      Pulse Rate 03/12/24 1135 76     Resp 03/12/24 1135 10     Temp 03/12/24 1135 (!) 96.9 F (36.1 C)     Temp Source 03/12/24 1135 Rectal     SpO2 03/12/24 1135 100 %     Weight --      Height --      Head Circumference --      Peak Flow --      Pain Score 03/12/24 1133 0     Pain Loc --      Pain Education --      Exclude from Growth Chart --     Most recent vital signs: Vitals:   03/12/24 1145 03/12/24 1330  BP: 112/67 110/60  Pulse: 66 (!) 57  Resp: 16 12  Temp:    SpO2: 99% 100%    General: Awake, no distress.  CV:  Good peripheral perfusion.  Regular rate  Resp:  Normal effort.  Clear to auscultation bilaterally Abd:  No distention.  epigastric tenderness, no pulsatile mass.  Rectal exam with black stool, Hemoccult  negative Other:  Dry oral mucosa.  No lower extremity edema   ED Results / Procedures / Treatments   Labs (all labs ordered are listed, but only abnormal results are displayed) Labs Reviewed  COMPREHENSIVE METABOLIC PANEL WITH GFR - Abnormal; Notable for the following components:      Result Value   Sodium 134 (*)    Potassium 3.3 (*)    Glucose, Bld 118 (*)    BUN 30 (*)    Creatinine, Ser 2.21 (*)    Calcium  8.0 (*)    Total Protein 4.9 (*)    Albumin 2.4 (*)    Total Bilirubin 1.3 (*)    GFR, Estimated 27 (*)    Anion gap 4 (*)    All other components within normal limits  CBC WITH DIFFERENTIAL/PLATELET - Abnormal; Notable for the following components:   WBC 3.8 (*)    RBC 3.57 (*)    Hemoglobin 11.7 (*)    HCT 33.9 (*)    Platelets 147 (*)    All other components within normal limits  TROPONIN I (HIGH SENSITIVITY) - Abnormal; Notable for the following components:   Troponin I (High Sensitivity) 69 (*)    All other components within normal limits   LIPASE, BLOOD  CK  URINALYSIS, W/ REFLEX TO CULTURE (INFECTION SUSPECTED)  TROPONIN I (HIGH SENSITIVITY)     EKG Interpreted by me Atrial fibrillation, rate of 61.  Normal axis, left bundle branch block.  No acute ischemic changes.   RADIOLOGY Chest x-ray interpreted by me, no lung consolidation or pneumothorax.  There is abnormal appearing rib shaped opacity in the left lower chest not following the normal contours of the chest wall.   PROCEDURES:  Procedures   MEDICATIONS ORDERED IN ED: Medications  sodium chloride  0.9 % bolus 1,000 mL (0 mLs Intravenous Stopped 03/12/24 1453)     IMPRESSION / MDM / ASSESSMENT AND PLAN / ED COURSE  I reviewed the triage vital signs and the nursing notes.  DDx: Aortic aneurysm, pancreatitis, gastritis, dehydration, electrolyte derangement, anemia, malnutrition, intracranial hemorrhage, C-spine fracture, GI mass, pneumonia, lung mass, rib fracture  Patient's presentation is most consistent with acute presentation with potential threat to life or bodily function.  Patient presents with generalized weakness, poor oral intake, possibly related to dehydration caused by dementia and lack of appetite.  He does have some abdominal tenderness and has had falls at home so we will obtain CT head and cervical spine for trauma workup along with CT chest abdomen pelvis.  Labs show stable mild anemia, stable CKD 3B.  Mildly elevated troponin.  Will give IV fluids.       FINAL CLINICAL IMPRESSION(S) / ED DIAGNOSES   Final diagnoses:  Dehydration     Rx / DC Orders   ED Discharge Orders     None        Note:  This document was prepared using Dragon voice recognition software and may include unintentional dictation errors.   Jacquie Maudlin, MD 03/12/24 [phone]

## 2024-03-15 ENCOUNTER — Telehealth: Payer: Self-pay | Admitting: Nurse Practitioner

## 2024-03-15 ENCOUNTER — Telehealth: Payer: Self-pay

## 2024-03-15 NOTE — Telephone Encounter (Signed)
 Copied from CRM [id number]. Topic: General - Call Back - No Documentation >> Mar 15, 2024 11:07 AM Shereese L wrote: Reason for CRM: patients daughter Devin Reese  is wanting Devin Reese or her nurse to give her a call back regarding this  [phone] Adv her the doctors and nurse are working and should be getting back to her as soon as possible

## 2024-03-15 NOTE — Telephone Encounter (Signed)
 Copied from CRM [id number]. Topic: Clinical - Medical Advice >> Mar 15, 2024  8:07 AM Albertha Alosa wrote: Reason for CRM: Patient daughter Ammon Bales called in regarding patient conditions, stating she would like to know about getting an home health nurse to evaluate patient , informed her patient may need to see pcp before home health nurse can be signed off, she stated she not sure if she can get him to the office, is wanting Bluford Burkitt or her nurse to give her a call back regarding this  [phone]

## 2024-03-16 ENCOUNTER — Other Ambulatory Visit: Payer: Self-pay | Admitting: Nurse Practitioner

## 2024-03-16 DIAGNOSIS — N183 Chronic kidney disease, stage 3 unspecified: Secondary | ICD-10-CM

## 2024-03-16 DIAGNOSIS — E119 Type 2 diabetes mellitus without complications: Secondary | ICD-10-CM

## 2024-03-16 DIAGNOSIS — J449 Chronic obstructive pulmonary disease, unspecified: Secondary | ICD-10-CM

## 2024-03-16 DIAGNOSIS — R413 Other amnesia: Secondary | ICD-10-CM

## 2024-03-16 NOTE — Telephone Encounter (Signed)
 Informed daughter

## 2024-03-18 ENCOUNTER — Telehealth: Payer: Self-pay

## 2024-03-18 ENCOUNTER — Ambulatory Visit

## 2024-03-18 ENCOUNTER — Other Ambulatory Visit: Payer: Self-pay

## 2024-03-18 VITALS — BP 92/61 | HR 111 | Temp 98.6°F

## 2024-03-18 DIAGNOSIS — J449 Chronic obstructive pulmonary disease, unspecified: Secondary | ICD-10-CM

## 2024-03-18 DIAGNOSIS — F03911 Unspecified dementia, unspecified severity, with agitation: Secondary | ICD-10-CM | POA: Insufficient documentation

## 2024-03-18 DIAGNOSIS — E876 Hypokalemia: Secondary | ICD-10-CM

## 2024-03-18 DIAGNOSIS — E0822 Diabetes mellitus due to underlying condition with diabetic chronic kidney disease: Secondary | ICD-10-CM | POA: Diagnosis not present

## 2024-03-18 DIAGNOSIS — R770 Abnormality of albumin: Secondary | ICD-10-CM | POA: Diagnosis not present

## 2024-03-18 DIAGNOSIS — R410 Disorientation, unspecified: Secondary | ICD-10-CM

## 2024-03-18 DIAGNOSIS — N184 Chronic kidney disease, stage 4 (severe): Secondary | ICD-10-CM

## 2024-03-18 DIAGNOSIS — R5381 Other malaise: Secondary | ICD-10-CM

## 2024-03-18 DIAGNOSIS — I5022 Chronic systolic (congestive) heart failure: Secondary | ICD-10-CM | POA: Diagnosis not present

## 2024-03-18 DIAGNOSIS — E871 Hypo-osmolality and hyponatremia: Secondary | ICD-10-CM | POA: Diagnosis not present

## 2024-03-18 DIAGNOSIS — E222 Syndrome of inappropriate secretion of antidiuretic hormone: Secondary | ICD-10-CM

## 2024-03-18 DIAGNOSIS — D631 Anemia in chronic kidney disease: Secondary | ICD-10-CM | POA: Diagnosis not present

## 2024-03-18 DIAGNOSIS — E119 Type 2 diabetes mellitus without complications: Secondary | ICD-10-CM | POA: Insufficient documentation

## 2024-03-18 NOTE — Progress Notes (Signed)
 Complex Care Management Note  Care Guide Note 03/18/2024 Name: Devin Reese MRN: [id number] DOB: Dec 08, 1926  Devin Reese is a 89 y.o. year old male who sees Bluford Burkitt, NP for primary care. I reached out to Aleem Melvin [place] by phone today to offer complex care management services.  Devin Reese was given information about Complex Care Management services today including:   The Complex Care Management services include support from the care team which includes your Nurse Care Manager, Clinical Social Worker, or Pharmacist.  The Complex Care Management team is here to help remove barriers to the health concerns and goals most important to you. Complex Care Management services are voluntary, and the patient may decline or stop services at any time by request to their care team member.   Complex Care Management Consent Status: Patient agreed to services and verbal consent obtained.   Follow up plan:  Telephone appointment with complex care management team member scheduled for:  BSW 03/18/2024  Encounter Outcome:  Patient Scheduled  Lenton Rail , RMA     [facility], [facility] Guide  Direct Dial: [phone]  Website: [email or web address]

## 2024-03-18 NOTE — Assessment & Plan Note (Signed)
 Did not discuss during today's visit due to deconditioning. Goals of care discussion was done. Please defer to plan for physical deconditioning from today's visit.

## 2024-03-18 NOTE — Assessment & Plan Note (Signed)
 Likely multifactorial including AKI on CKD. Goals of care discussed today. Discussed between full medical intervention, recommend labs to check for BMP, EKG vs comfort focused care. Family deferred lab evaluation today would like to proceed with comfort care. Hospice referral made today. Please defer to plan for physical deconditioning.

## 2024-03-18 NOTE — Assessment & Plan Note (Signed)
 Likely multifactorial including AKI on CKD, and other chronic illness. Goals of care discussed today. Discussed between full medical intervention, recommend labs to check for BMP, CBC, EKG vs comfort focused care. Family deferred lab evaluation today would like to proceed with comfort care. Hospice referral made today. Please defer to plan for physical deconditioning.

## 2024-03-18 NOTE — Patient Outreach (Signed)
 Complex Care Management   Visit Note  03/18/2024  Name:  Devin Reese MRN: [id number] DOB: 03-30-1927  Situation: Referral received for Complex Care Management related to  evaluation/level of care  I obtained verbal consent from Caregiver.  Visit completed with patients daughter  on the phone  Background:   Past Medical History:  Diagnosis Date   Atrial flutter (HCC)    CAD (coronary artery disease)    CKD (chronic kidney disease), stage II    COPD (chronic obstructive pulmonary disease) (HCC)    Coronary artery disease    CABG in 1991 at [facility]. Most recent cardiac catheterization in 2004 was complicated by stroke.   Diabetes mellitus without complication (HCC)    Hyperlipidemia    Hypertension    Prostate cancer (HCC)    Stroke [facility])     Assessment: SW completed a telephone outreach with patients daughter, she states patient has declined over the past week and she does not know what is wrong, she wants to have him evaluated to figure out the next steps. She wants to speak with a doctor to get more guidance on what she should do. SW and daughter discussed options of locating a PCP to come out to the home and getting him to the clinic for an appointment. Daughter states she is unsure if she is able to get him to the office, as he has a walker but no wheelchair. SW and daughter also discussed placement for patient and getting an FL2. SW provided daughter with home PCP services provided by [facility] and [facility]. Daughter states that her privately paying someone to come in and care for him. SW will contact clinic on getting an appointment for patient to be evaluated.  SW contacted clinic and scheduled an appointment for 5/8 for an evaluation and FL2. Daughter called and scheduled a follow up for today at 3pm. SW will research facilities in [place] that has beds.   Recommendation:   PCP Follow-up  Follow Up Plan:   Telephone follow-up in 1 week  Valora Gear, BSW,  [facility]  Value Based [facility] Social Worker, Population Health [phone]

## 2024-03-18 NOTE — Assessment & Plan Note (Signed)
 Likely due to chronic illness, nutritional deficiency, reduced activity associated with functional decline impacting patient's quality of life and needing assistance with all ADLs. Discussed in detail with all three family members on continuing full medical interventions including getting labs updated, referral to nephrology, neurology, er visit for IV fluids etc, physical therapy etc.    At this time family would like to avoid aggressive interventions. Social worker referral is already in place. Daughter is going to discuss SNF with Child psychotherapist. Referral to hospice for evaluation made today.

## 2024-03-18 NOTE — Assessment & Plan Note (Signed)
 Not discussed due to deconditioning, functional decline. Please defer to plan for physical deconditioning.

## 2024-03-18 NOTE — Patient Instructions (Signed)
 Visit Information  Thank you for taking time to visit with me today. Please don't hesitate to contact me if I can be of assistance to you before our next scheduled appointment.  Our next appointment is by telephone on 03/22/24 at 1pm Please call the care guide team at [phone] if you need to cancel or reschedule your appointment.   Following is a copy of your care plan:   Goals Addressed   None     Please call the Suicide and Crisis Lifeline: 988 call the [place]  National Suicide Prevention Lifeline: 916 849 8499 or TTY: 6803561053 TTY 260 261 7609) to talk to a trained counselor call 1-800-273-TALK (toll free, 24 hour hotline) call 911 if you are experiencing a Mental Health or Behavioral Health Crisis or need someone to talk to.     Valora Gear, Florestine Hurl, MHA [place]  [facility] Social Worker, Population Health [phone]

## 2024-03-18 NOTE — Addendum Note (Signed)
 Addended by: Dreshon Proffit, Maylon Spearing on: 03/18/2024 03:50 PM   Modules accepted: Orders

## 2024-03-18 NOTE — Assessment & Plan Note (Signed)
 Likely multifactorial including AKI on CKD, and other chronic illness. Goals of care discussed today. Discussed between full medical intervention, recommend labs to check for BMP, EKG vs comfort focused care. Family deferred lab evaluation today would like to proceed with comfort care. Hospice referral made today. Please defer to plan for physical deconditioning.

## 2024-03-18 NOTE — Assessment & Plan Note (Signed)
 Likely multifactorial. Goals of care discussed today. Discussed between full medical intervention, recommend labs to check for BMP, EKG vs comfort focused care. Family deferred lab evaluation today would like to proceed with comfort care. Hospice referral made today. Please defer to plan for physical deconditioning.

## 2024-03-18 NOTE — Progress Notes (Signed)
 Established Patient Office Visit   Subjective  Patient ID: Devin Reese, male    DOB: 1927/07/15  Age: 89 y.o. MRN: [id number]  Chief Complaint  Patient presents with   Dehydration   Hypotension   Leg Pain   Arm Pain   Headache    He  has a past medical history of Atrial flutter (HCC), CAD (coronary artery disease), CKD (chronic kidney disease), stage II, COPD (chronic obstructive pulmonary disease) (HCC), Coronary artery disease, Diabetes mellitus without complication (HCC), Hyperlipidemia, Hypertension, Prostate cancer (HCC), and Stroke (HCC).  Leg Pain   Arm Pain   Headache    Patient with h/o dementia, atrial flutter, CAD, CKD, DM II, HTN, stroke presents for ED f/u. He was seen at [facility] ED on 03/12/24 for generalized weakness, reduced oral intake.   In the ED he was found to be hypotensive with BP of 92/45 mmHg, HR was ranging 57-76/min, he was hypothermic with rectal temp of 96.7 F.  Lab showed patient to have:  Borderline low sodium: 134 Low Potassium: 3.3 Cr: 2.21, baseline Cr:  GFR: 27  WBC: 3.8 Hb 11.7 Platelets: 147 EKG showed patient A fib.  He was treated with IVF and discharge  home.   Patient is accompanied with his wife, daughter Ammon Bales, son in law Tripp. History mostly obtained from the family member as patient is not oriented to time, place, person.   Family reports over the last few months patient has reduced oral intake. Over the last 7-10 days he is noted to have significantly reduced oral fluids, food despite family encouragement. He currently lives at home with his wife. His daughter Ammon Bales has been staying with them to provide care. Patient mumbles non-coherent noises throughout the day. He has not complained of pain. He is mostly eating cookies, coffee, banana, berries. He has declined drinking ensure. Patient's urine is dark in color. He seemed to have low energy. Family declines fever, chills, vomiting, diarrhea.   Social worker  reached out to patient's daughter earlier today. Family is looking into potential SNF vs home health care at this time.   Review of Systems  Neurological:  Positive for headaches.   As per HPI    Objective:     BP 92/61   Pulse (!) 111   Temp 98.6 F (37 C) (Oral)   SpO2 96%      03/18/2024    3:15 PM 08/08/2023    1:34 PM 02/05/2023    3:54 PM  Depression screen PHQ 2/9  Decreased Interest 0 1 0  Down, Depressed, Hopeless 0 1 0  PHQ - 2 Score 0 2 0  Altered sleeping 0 1 0  Tired, decreased energy 0 1 0  Change in appetite 0 0 0  Feeling bad or failure about yourself  0 0 0  Trouble concentrating 0 1 0  Moving slowly or fidgety/restless 0 1 0  Suicidal thoughts 0 0 0  PHQ-9 Score 0 6 0  Difficult doing work/chores Not difficult at all Somewhat difficult Not difficult at all      03/18/2024    3:15 PM 08/08/2023    1:34 PM 02/05/2023    3:55 PM  GAD 7 : Generalized Anxiety Score  Nervous, Anxious, on Edge 0 1 0  Control/stop worrying 0 1 0  Worry too much - different things 0 1 0  Trouble relaxing 0 0 0  Restless 0 0 0  Easily annoyed or irritable 0 0 0  Afraid -  awful might happen 0 0 0  Total GAD 7 Score 0 3 0  Anxiety Difficulty Not difficult at all Somewhat difficult Not difficult at all    Physical Exam Constitutional:      Appearance: He is well-groomed.     Comments: Appears frail  HENT:     Mouth/Throat:     Mouth: Mucous membranes are moist.  Cardiovascular:     Rate and Rhythm: Tachycardia present.  Pulmonary:     Breath sounds: No wheezing.  Abdominal:     General: Bowel sounds are normal.     Tenderness: There is no guarding.  Musculoskeletal:     Right lower leg: No edema.     Left lower leg: No edema.  Skin:    General: Skin is warm.     Capillary Refill: Capillary refill takes less than 2 seconds.  Neurological:     Mental Status: He is lethargic, disoriented and confused.  Psychiatric:        Behavior: Behavior is cooperative.         Cognition and Memory: Cognition is impaired.    I reviewed patient's ED notes, labs and imaging from 03/12/24:  CT chest, abdomen pelvis without contrast, per radiology review  1. No acute intrathoracic, intra-abdominal, or intrapelvic process on this unenhanced exam. 2. Diffuse aortic atherosclerosis, with marked ectasia of the thoracic aorta. No evidence of thoracoabdominal aortic aneurysm. Assessment of the vascular lumen cannot be performed without IV contrast. 3. Large hiatal hernia containing the majority of the stomach and the mid transverse colon. 4. Sigmoid diverticulosis without diverticulitis. 5. 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 The bones are diffusely osteopenic.  CT head:  1. No acute intracranial abnormality. 2. Chronic Left PCA territory infarct.  Component     Latest Ref Rng 02/22/2024 03/12/2024  WBC     4.0 - 10.5 K/uL 4.4  3.8 (L)   RBC     4.22 - 5.81 MIL/uL 3.83 (L)  3.57 (L)   Hemoglobin     13.0 - 17.0 g/dL 16.1 (L)  09.6 (L)   HCT     39.0 - 52.0 % 37.2 (L)  33.9 (L)   MCV     80.0 - 100.0 fL 97.1  95.0   MCH     26.0 - 34.0 pg 32.4  32.8   MCHC     30.0 - 36.0 g/dL 04.5  40.9   RDW     81.1 - 15.5 % 13.8  14.0   Platelets     150 - 400 K/uL 174  147 (L)   nRBC     0.0 - 0.2 % 0.0  0.0   Neutrophils     % 53  49   NEUT#     1.7 - 7.7 K/uL 2.3  1.8   Lymphocytes     % 33  38   Lymphs Abs     0.7 - 4.0 K/uL 1.4  1.4   Monocytes Relative     % 11  10   Monocyte #     0.1 - 1.0 K/uL 0.5  0.4   Eosinophil     % 2  2   Eosinophils Absolute     0.0 - 0.5 K/uL 0.1  0.1   Basophil     % 1  1   Basophils Absolute     0.0 - 0.1 K/uL 0.0  0.0   Immature Granulocytes     %  0  0   Abs Immature Granulocytes     0.00 - 0.07 K/uL 0.01  0.01   Specimen Source  URINE, CLEAN CATCH   Color, Urine     YELLOW   AMBER !   Appearance     CLEAR   CLEAR !   Specific Gravity, Urine     1.005  - 1.030   1.026   pH     5.0 - 8.0   5.0   Glucose, UA     NEGATIVE mg/dL  NEGATIVE   Hgb urine dipstick     NEGATIVE   NEGATIVE   Bilirubin Urine     NEGATIVE   NEGATIVE   Ketones, ur     NEGATIVE mg/dL  NEGATIVE   Protein     NEGATIVE mg/dL  NEGATIVE   Nitrite     NEGATIVE   NEGATIVE   Leukocytes,Ua     NEGATIVE   NEGATIVE   RBC / HPF     0 - 5 RBC/hpf  0-5   WBC, UA     0 - 5 WBC/hpf  0-5   Bacteria, UA     NONE SEEN   NONE SEEN   Squamous Epithelial / HPF     0 - 5 /HPF  0-5   Mucus  PRESENT   Hyaline Casts, UA  PRESENT   Sodium     135 - 145 mmol/L 137  134 (L)   Potassium     3.5 - 5.1 mmol/L 4.1  3.3 (L)   Chloride     98 - 111 mmol/L 106  108   CO2     22 - 32 mmol/L 24  22   Glucose     70 - 99 mg/dL 604 (H)  540 (H)   BUN     8 - 23 mg/dL 31 (H)  30 (H)   Creatinine     0.61 - 1.24 mg/dL 9.81 (H)  1.91 (H)   Calcium      8.9 - 10.3 mg/dL 8.8 (L)  8.0 (L)   Total Protein     6.5 - 8.1 g/dL  4.9 (L)   Albumin     3.5 - 5.0 g/dL  2.4 (L)   AST     15 - 41 U/L  18   ALT     0 - 44 U/L  9   Alkaline Phosphatase     38 - 126 U/L  53   Total Bilirubin     0.0 - 1.2 mg/dL  1.3 (H)   GFR, Estimated     >60 mL/min 27 (L)  27 (L)   Anion gap     5 - 15  7  4  (L)   Lipase     11 - 51 U/L  27   Troponin I (High Sensitivity)     <18 ng/L  71 (H)   Troponin I (High Sensitivity)       69 (H)   CK Total     49 - 397 U/L  125         No results found for any visits on 03/18/24.  The ASCVD Risk score (Arnett DK, et al., 2019) failed to calculate for the following reasons:   The 2019 ASCVD risk score is only valid for ages 21 to 62   Risk score cannot be calculated because patient has a medical history suggesting prior/existing ASCVD    Assessment & Plan:  Patient presenting  with his wife, daughter Ammon Bales and son in law Tripp for ED follow up and discuss goals of care conversations today.    Physical deconditioning Assessment & Plan: Likely  due to chronic illness, nutritional deficiency, reduced activity associated with functional decline impacting patient's quality of life and needing assistance with all ADLs. Discussed in detail with all three family members on continuing full medical interventions including getting labs updated, referral to nephrology, neurology, er visit for IV fluids etc, physical therapy etc.    At this time family would like to avoid aggressive interventions. Social worker referral is already in place. Daughter is going to discuss SNF with Child psychotherapist. Referral to hospice for evaluation made today.   Orders: -     Ambulatory referral to Hospice  Confusion Assessment & Plan: Likely multifactorial including AKI on CKD, and other chronic illness. Goals of care discussed today. Discussed between full medical intervention, recommend labs to check for BMP, CBC, EKG vs comfort focused care. Family deferred lab evaluation today would like to proceed with comfort care. Hospice referral made today. Please defer to plan for physical deconditioning.    Hypokalemia Assessment & Plan: Likely multifactorial including AKI on CKD, and other chronic illness. Goals of care discussed today. Discussed between full medical intervention, recommend labs to check for BMP, EKG vs comfort focused care. Family deferred lab evaluation today would like to proceed with comfort care. Hospice referral made today. Please defer to plan for physical deconditioning.    Stage 4 chronic kidney disease (HCC) Assessment & Plan: Likely multifactorial including AKI on CKD. Goals of care discussed today. Discussed between full medical intervention, recommend labs to check for BMP, EKG vs comfort focused care. Family deferred lab evaluation today would like to proceed with comfort care. Hospice referral made today. Please defer to plan for physical deconditioning.    Dementia with agitation, unspecified dementia severity, unspecified dementia type  [facility]) Assessment & Plan: Likely multifactorial. Goals of care discussed today. Discussed between full medical intervention, recommend labs to check for BMP, EKG vs comfort focused care. Family deferred lab evaluation today would like to proceed with comfort care. Hospice referral made today. Please defer to plan for physical deconditioning.    Anemia due to stage 4 chronic kidney disease (HCC) Assessment & Plan: Likely multifactorial including AKI on CKD. Goals of care discussed today. Discussed between full medical intervention, recommend labs to check for BMP, EKG vs comfort focused care. Family deferred lab evaluation today would like to proceed with comfort care. Hospice referral made today. Please defer to plan for physical deconditioning.    Chronic systolic congestive heart failure [facility]) Assessment & Plan: Not discussed due to deconditioning, functional decline. Please defer to plan for physical deconditioning.    Chronic obstructive pulmonary disease, unspecified COPD type (HCC) Assessment & Plan: Did not discuss during today's visit due to deconditioning. Goals of care discussion was done. Please defer to plan for physical deconditioning from today's visit.    Diabetes mellitus due to underlying condition with stage 4 chronic kidney disease, without long-term current use of insulin (HCC) Assessment & Plan: Did not discuss during today's visit due to deconditioning. Goals of care discussion was done. Please defer to plan for physical deconditioning from today's visit.    Hyponatremia  Low serum albumin Assessment & Plan: Likely from reduced oral intake.  Goals of care discussed today. Discussed between full medical intervention, recommend CMP.  Family deferred lab evaluation today would like to proceed with comfort care.  Hospice referral made today. Please defer to plan for physical deconditioning.      I spent  45 minutes on the day of this face to face encounter reviewing  patient's  most recent ED visits,  labs,  imaging,  prior relevant surgical and non surgical procedures, extensive discussion with family members including patient's wife, daughter son in law on goals of care, need for extensive referrals vs continued evaluation with limitations recommendations.   Return for PCP f/u for acute condition changes or as needed .   Jacklin Mascot, MD

## 2024-03-18 NOTE — Assessment & Plan Note (Signed)
 Likely from reduced oral intake.  Goals of care discussed today. Discussed between full medical intervention, recommend CMP.  Family deferred lab evaluation today would like to proceed with comfort care. Hospice referral made today. Please defer to plan for physical deconditioning.

## 2024-03-19 ENCOUNTER — Telehealth: Payer: Self-pay

## 2024-03-19 NOTE — Telephone Encounter (Signed)
 Dr.  Casimir Cleaver seen pt once after hospital stay. Sending to both providers for clarification:

## 2024-03-19 NOTE — Telephone Encounter (Signed)
 Copied from CRM [id number]. Topic: General - Other >> Mar 19, 2024  1:30 PM Lonzell Robin C wrote: Reason for CRM: Star from [facility] stated that patient and family is requesting for Dr. Casimir Cleaver to be Patient's hospice PCP. Please contact Star regarding this matter @ [phone]  **If Dr. Lyndee Saner to be patient's Hospice PCP does Dr. Casimir Cleaver Feel the patient has 6 months or less to live?**  **Does Dr. Casimir Cleaver wants to managed her own comfort care orders for the patient or does Dr. [facility]. To managed comfort care orders?**

## 2024-03-22 ENCOUNTER — Other Ambulatory Visit: Payer: Self-pay

## 2024-03-22 NOTE — Telephone Encounter (Signed)
 Copied from CRM [id number]. Topic: General - Other >> Mar 22, 2024 10:00 AM Marlan Silva wrote: Reason for CRM: Star calling from [facility] received hospice referral from PCP Jacklin Mascot, Is trying to se if she would agree to be patients hospice physician [phone].

## 2024-03-22 NOTE — Patient Instructions (Signed)

## 2024-03-22 NOTE — Patient Outreach (Signed)
 Complex Care Management   Visit Note  03/22/2024  Name:  Devin Reese MRN: [id number] DOB: 15-Dec-1926  Situation: Referral received for Complex Care Management related to  Level of care/SNF  I obtained verbal consent from Caregiver.  Visit completed with patients daughter  on the phone  Background:   Past Medical History:  Diagnosis Date   Atrial flutter (HCC)    CAD (coronary artery disease)    CKD (chronic kidney disease), stage II    COPD (chronic obstructive pulmonary disease) (HCC)    Coronary artery disease    CABG in 1991 at [facility]. Most recent cardiac catheterization in 2004 was complicated by stroke.   Diabetes mellitus without complication (HCC)    Hyperlipidemia    Hypertension    Prostate cancer (HCC)    Stroke [facility])     Assessment: SW completed a telephone outreach with patients daughter, she states they have been in contact with [facility] and they have been really great.Daughter states SNF is no longer needed. They did receive a hospital bed for him last night and will continue to provided care. No other resources are needed. SW provided contact information for any future needs.   Recommendation:   No recommendations at this time  Follow Up Plan:   Patient has met all care management goals. Care Management case will be closed. Patient has been provided contact information should new needs arise.   Valora Gear, Florestine Hurl, MHA [place]  [facility] Social Worker, Population Health [phone]

## 2024-03-22 NOTE — Telephone Encounter (Signed)
 I saw the patient once for hospital f/u. I had lengthy conversation with patient's wife, daughter and son-in-law on goals of care during his appointment with me on 03/18/2024. I put in referral for hospice. I believe it will be beneficial for patient to f/u with hospice provider for comfort care orders or his PCP.   Thank you,  Jacklin Mascot, MD

## 2024-03-22 NOTE — Telephone Encounter (Signed)
 LVM for "star" to give the office a call back. Please transfer call to office

## 2024-03-22 NOTE — Telephone Encounter (Signed)
 Spoke with Star from [facility]. Informed her that we would away your response and give them a call  back.

## 2024-03-24 ENCOUNTER — Telehealth: Payer: Self-pay

## 2024-03-24 NOTE — Telephone Encounter (Signed)
 Called pts daughter as he was on the schedule for 03-25-24 and provider stated that he did not have to come in if they just needed an FL2 form filled out as he was just seen last week as well.    Ms. Ammon Bales pts daughter informed me that pt passed away yesterday morning on     Updated pts chart to reflect that he is deceased.

## 2024-03-25 ENCOUNTER — Ambulatory Visit: Admitting: Nurse Practitioner

## 2024-04-02 ENCOUNTER — Other Ambulatory Visit: Payer: Self-pay

## 2024-04-18 NOTE — Telephone Encounter (Signed)
 Called [facility] and informed them that the providers would like for hospice Ms. Star verbalized understanding and made note of it for the pts chart

## 2024-04-18 DEATH — deceased

## 2024-05-25 ENCOUNTER — Ambulatory Visit: Admitting: Cardiovascular Disease
